# Patient Record
Sex: Male | Born: 1960 | Race: Black or African American | Hispanic: No | Smoking: Current some day smoker
Health system: Southern US, Community
[De-identification: ages and names within clinical notes are randomized; demographics above are authoritative.]

## PROBLEM LIST (undated history)

## (undated) DIAGNOSIS — R197 Diarrhea, unspecified: Secondary | ICD-10-CM

## (undated) DIAGNOSIS — K429 Umbilical hernia without obstruction or gangrene: Secondary | ICD-10-CM

## (undated) DIAGNOSIS — B019 Varicella without complication: Secondary | ICD-10-CM

## (undated) DIAGNOSIS — K439 Ventral hernia without obstruction or gangrene: Secondary | ICD-10-CM

## (undated) DIAGNOSIS — M1990 Unspecified osteoarthritis, unspecified site: Secondary | ICD-10-CM

## (undated) DIAGNOSIS — I5030 Unspecified diastolic (congestive) heart failure: Secondary | ICD-10-CM

## (undated) DIAGNOSIS — K219 Gastro-esophageal reflux disease without esophagitis: Secondary | ICD-10-CM

## (undated) DIAGNOSIS — I2510 Atherosclerotic heart disease of native coronary artery without angina pectoris: Secondary | ICD-10-CM

## (undated) DIAGNOSIS — M5030 Other cervical disc degeneration, unspecified cervical region: Secondary | ICD-10-CM

## (undated) DIAGNOSIS — I700 Atherosclerosis of aorta: Secondary | ICD-10-CM

## (undated) DIAGNOSIS — G40909 Epilepsy, unspecified, not intractable, without status epilepticus: Secondary | ICD-10-CM

## (undated) DIAGNOSIS — E785 Hyperlipidemia, unspecified: Secondary | ICD-10-CM

## (undated) DIAGNOSIS — K469 Unspecified abdominal hernia without obstruction or gangrene: Secondary | ICD-10-CM

## (undated) DIAGNOSIS — I6789 Other cerebrovascular disease: Secondary | ICD-10-CM

## (undated) DIAGNOSIS — I10 Essential (primary) hypertension: Secondary | ICD-10-CM

## (undated) DIAGNOSIS — E669 Obesity, unspecified: Secondary | ICD-10-CM

## (undated) DIAGNOSIS — M5134 Other intervertebral disc degeneration, thoracic region: Secondary | ICD-10-CM

## (undated) DIAGNOSIS — I422 Other hypertrophic cardiomyopathy: Secondary | ICD-10-CM

## (undated) DIAGNOSIS — K635 Polyp of colon: Secondary | ICD-10-CM

## (undated) DIAGNOSIS — I509 Heart failure, unspecified: Secondary | ICD-10-CM

## (undated) DIAGNOSIS — M2578 Osteophyte, vertebrae: Secondary | ICD-10-CM

## (undated) DIAGNOSIS — Z87891 Personal history of nicotine dependence: Secondary | ICD-10-CM

## (undated) DIAGNOSIS — J189 Pneumonia, unspecified organism: Secondary | ICD-10-CM

## (undated) DIAGNOSIS — J449 Chronic obstructive pulmonary disease, unspecified: Secondary | ICD-10-CM

## (undated) DIAGNOSIS — E876 Hypokalemia: Secondary | ICD-10-CM

## (undated) DIAGNOSIS — IMO0002 Reserved for concepts with insufficient information to code with codable children: Secondary | ICD-10-CM

## (undated) DIAGNOSIS — M795 Residual foreign body in soft tissue: Secondary | ICD-10-CM

## (undated) DIAGNOSIS — R55 Syncope and collapse: Secondary | ICD-10-CM

## (undated) DIAGNOSIS — Z1389 Encounter for screening for other disorder: Secondary | ICD-10-CM

## (undated) DIAGNOSIS — I517 Cardiomegaly: Secondary | ICD-10-CM

## (undated) HISTORY — DX: Essential (primary) hypertension: I10

## (undated) HISTORY — DX: Umbilical hernia without obstruction or gangrene: K42.9

## (undated) HISTORY — DX: Encounter for screening for other disorder: Z13.89

## (undated) HISTORY — DX: Polyp of colon: K63.5

## (undated) HISTORY — DX: Personal history of nicotine dependence: Z87.891

## (undated) HISTORY — DX: Obesity, unspecified: E66.9

## (undated) HISTORY — DX: Epilepsy, unspecified, not intractable, without status epilepticus: G40.909

## (undated) HISTORY — DX: Diarrhea, unspecified: R19.7

## (undated) HISTORY — DX: Reserved for concepts with insufficient information to code with codable children: IMO0002

## (undated) HISTORY — DX: Ventral hernia without obstruction or gangrene: K43.9

## (undated) HISTORY — DX: Unspecified abdominal hernia without obstruction or gangrene: K46.9

## (undated) HISTORY — DX: Unspecified osteoarthritis, unspecified site: M19.90

---
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
## 1981-10-28 HISTORY — PX: KNEE ARTHROSCOPY: SUR90

## 2004-08-19 ENCOUNTER — Emergency Department: Payer: Self-pay | Admitting: Emergency Medicine

## 2004-08-26 ENCOUNTER — Emergency Department: Payer: Self-pay | Admitting: Emergency Medicine

## 2005-01-28 DIAGNOSIS — K469 Unspecified abdominal hernia without obstruction or gangrene: Secondary | ICD-10-CM

## 2005-01-28 HISTORY — DX: Unspecified abdominal hernia without obstruction or gangrene: K46.9

## 2008-10-06 ENCOUNTER — Ambulatory Visit: Payer: Self-pay | Admitting: General Surgery

## 2009-01-28 DIAGNOSIS — M1990 Unspecified osteoarthritis, unspecified site: Secondary | ICD-10-CM

## 2009-01-28 HISTORY — DX: Unspecified osteoarthritis, unspecified site: M19.90

## 2009-08-25 ENCOUNTER — Ambulatory Visit: Payer: Self-pay | Admitting: Orthopedic Surgery

## 2009-09-14 ENCOUNTER — Ambulatory Visit: Payer: Self-pay | Admitting: Orthopedic Surgery

## 2009-10-28 HISTORY — PX: KNEE SURGERY: SHX244

## 2011-12-29 HISTORY — PX: HERNIA REPAIR: SHX51

## 2012-01-11 ENCOUNTER — Encounter: Payer: Self-pay | Admitting: *Deleted

## 2012-01-15 ENCOUNTER — Ambulatory Visit: Payer: Self-pay | Admitting: General Surgery

## 2012-01-15 DIAGNOSIS — K635 Polyp of colon: Secondary | ICD-10-CM

## 2012-01-15 HISTORY — DX: Polyp of colon: K63.5

## 2012-01-15 HISTORY — PX: COLONOSCOPY: SHX174

## 2012-01-27 ENCOUNTER — Ambulatory Visit: Payer: Self-pay | Admitting: Anesthesiology

## 2012-01-27 DIAGNOSIS — I10 Essential (primary) hypertension: Secondary | ICD-10-CM

## 2012-01-28 ENCOUNTER — Ambulatory Visit: Payer: Self-pay | Admitting: General Surgery

## 2012-01-28 LAB — CBC WITH DIFFERENTIAL/PLATELET
Basophil %: 0.2 %
Eosinophil %: 0 %
HCT: 44.4 % (ref 40.0–52.0)
Lymphocyte %: 22.6 %
MCV: 86 fL (ref 80–100)
Neutrophil %: 67.7 %

## 2012-01-28 LAB — BASIC METABOLIC PANEL
BUN: 15 mg/dL (ref 7–18)
Calcium, Total: 8.2 mg/dL — ABNORMAL LOW (ref 8.5–10.1)
Chloride: 106 mmol/L (ref 98–107)
Co2: 27 mmol/L (ref 21–32)
EGFR (African American): >60
EGFR (Non-African Amer.): >60
Osmolality: 281 (ref 275–301)
Potassium: 4.3 mmol/L (ref 3.5–5.1)

## 2012-04-07 ENCOUNTER — Encounter: Payer: Self-pay | Admitting: *Deleted

## 2012-04-20 ENCOUNTER — Ambulatory Visit: Payer: Self-pay | Admitting: General Surgery

## 2012-04-20 ENCOUNTER — Encounter: Payer: Self-pay | Admitting: *Deleted

## 2012-05-12 ENCOUNTER — Ambulatory Visit (INDEPENDENT_AMBULATORY_CARE_PROVIDER_SITE_OTHER): Payer: 59 | Admitting: General Surgery

## 2012-05-12 ENCOUNTER — Encounter: Payer: Self-pay | Admitting: General Surgery

## 2012-05-12 VITALS — BP 160/82 | HR 82 | Resp 14 | Ht 66.0 in | Wt 295.0 lb

## 2012-05-12 DIAGNOSIS — K439 Ventral hernia without obstruction or gangrene: Secondary | ICD-10-CM

## 2012-05-12 NOTE — Progress Notes (Signed)
Patient ID: Douglas Newton, male   DOB: [birth year], 52 y.o.   MRN: [id number]  Chief Complaint  Patient presents with  . Routine Post Op    hernia    HPI Douglas Newton is a 52 y.o. male here today for post op ventral  hernia . The patient underwent laparoscopic repair of a ventral and a recurrent umbilical hernia making use of a 10 x 15 cm Physiomesh on December 31, 2011. He has no complaints at present. HPI  Past Medical History  Diagnosis Date  . Arthritis 2011  . Hypertension admits 2011  . Ventral hernia, unspecified, without mention of obstruction or gangrene   . Personal history of tobacco use, presenting hazards to health   . Diarrhea   . Ulcer 1975    admits 1974  . Hernia 2007    admits 2006  . Seizure disorder 1965    admits 1964  . Obesity, unspecified   . Umbilical hernia without mention of obstruction or gangrene   . Screening for obesity     Past Surgical History  Procedure Laterality Date  . Abdominal surgery  1965  . Knee surgery  October 2011    left knee  . Knee arthroscopy  October 1983    right knee  . Lobectomy  May 1965  . Wires removed from under arm  1963  . Arm surgery  1969  . Stomach tube  1965  . Hernia repair  December 2013    ventral hernia,umbilical hernia    No family history on file.  Social History History  Substance Use Topics  . Smoking status: Former Smoker -- 1.00 packs/day for 1 years  . Smokeless tobacco: Never Used  . Alcohol Use: Yes    No Known Allergies  Current Outpatient Prescriptions  Medication Sig Dispense Refill  . carbamazepine (TEGRETOL) 200 MG tablet Take 200 mg by mouth 4 (four) times daily.      . nebivolol (BYSTOLIC) 10 MG tablet Take 10 mg by mouth daily.      . tadalafil (CIALIS) 20 MG tablet Take 20 mg by mouth as needed.       No current facility-administered medications for this visit.    Review of Systems Review of Systems  Constitutional: Negative.   Respiratory: Negative.    Cardiovascular: Negative.     Blood pressure 160/82, pulse 82, resp. rate 14, height 5\' 6"  (1.676 m), weight 295 lb (133.811 kg).  Physical Exam Physical Exam  Abdominal: Soft. Normal appearance. No hernia.  Healing well ventral hernia   The port sites are well-healed. Previously noted tenderness at the right lateral transabdominal wall suture site has resolved.  Data Reviewed None.  Assessment    Doing well status post laparoscopic ventral hernia repair.     Plan    The patient has been strongly encouraged to consider evaluation for sleep apnea.He reports he will "consider this".  Good judgment with strenuous activity and lifting has been encouraged.  Follow-through be done as needed basis.        Douglas Newton, Douglas Newton 05/12/2012, 9:57 PM

## 2012-05-12 NOTE — Patient Instructions (Addendum)
Prn

## 2014-05-20 NOTE — Op Note (Signed)
PATIENT NAME:  Douglas Newton, Douglas Newton MR#:  [id number] DATE OF BIRTH:  03/31/1960  DATE OF PROCEDURE:  01/28/2012  PREOPERATIVE DIAGNOSES:  1.  Ventral hernia.  2.  Umbilical hernia.   POSTOPERATIVE DIAGNOSES: 1.  Ventral hernia. 2.  Umbilical hernia.  PROCEDURE PERFORMED: Laparoscopic repair of ventral and umbilical hernia with Physiomesh.   SURGEON: Robert Bellow, MD.   ANESTHESIA: General endotracheal under Dr. Carolin Sicks.   ESTIMATED BLOOD LOSS: Minimal.   INDICATIONS FOR SURGERY: This 54 year old male underwent abdominal exploration as a child. He has developed a hernia to the right of the midline with a large amount of non-reducible fat. CT scan completed three years ago showed no additional defects. Clinical exam prior to surgery suggested a small fascial defect at the umbilicus. He was felt to be a candidate for laparoscopic repair.   PROCEDURE IN DETAIL: With the patient under adequate general endotracheal anesthesia, the abdomen was prepped with ChloraPrep and draped. A 5 mm step port was placed after a Veress needle was inserted and the abdomen insufflated with CO2 at 10 mmHg.  Hanging drop test confirmed safe entry into the abdomen. A 5 mm angled scope was used and an examination showed no evidence of injury. A 12 mm port was placed approximately 5 cm inferior to this and an additional 5 mm step port placed 5 mm below the 12 mm port site. These were all outside the pectoralis fascia. Inspection from the lowest port showed no evidence of injury from initial port placement. There was a large amount of omentum through, what appeared to be, a fairly small defect. This was reduced. Hemostasis was with the Harmonic scalpel. Inspection of the bowel showed no injury. The falciform ligament was taken down to provide clear service for mesh placement. A 15 x 15 cm piece of Physiomesh was trimmed to 10 x 15 cm.  This was used to close the primary defect to the right of the midline. This was  anchored in place with secure strap absorbable tacks. Four corner sutures of 0-PDS were utilized, as well as two superior and inferior sutures made passage completed with the needle passer. The area of the umbilicus was cleared of fat. A small defect was noted. A 5 x 5 piece of Physiomesh was placed over this area and tacked circumferentially with the secure strap absorbable tacks. Good hemostasis was noted. The abdomen was then desufflated under direct vision. The fascia at the 12 mm port site was closed with an 0-PDS suture, making use of cone needle passer. This was done under direct vision. Skin incisions were closed with 4-0 Vicryl subcuticular sutures. Benzoin and SteriStrips were applied, followed by Telfa and Tegaderm dressings. Telfa was applied to the multiple transfacial suture sites after SteriStrips were applied. ABD pads, as well as an abdominal binder was then placed. The patient had significant _bucking_______ during awakening from anesthesia. Direct pressure was held over the hernia repair sites during this fairly vigorous coughing.   The patient did have pneumatic compression stockings and TED stockings for deep vein thrombosis prophylaxis. He received Kefzol intravenously for antibiotic prophylaxis.   This Delano Metz A. Gleason the [place] Pink, M.D. [facility] patient:   ____________________________ Robert Bellow, MD jwb:eg D: 01/28/2012 19:39:49 ET T: 01/28/2012 20:17:21 ET JOB#: [id number]  cc:  1.  Robert Bellow, MD, <Dictator> 2.  Irven Easterly. Kary Kos, MD [place] Amedeo Kinsman MD ELECTRONICALLY SIGNED 01/29/2012 11:58

## 2015-04-19 ENCOUNTER — Observation Stay
Admission: EM | Admit: 2015-04-19 | Discharge: 2015-04-20 | Disposition: A | Discharge status: Home / Self Care | Payer: 59 | Attending: Internal Medicine | Admitting: Internal Medicine

## 2015-04-19 ENCOUNTER — Inpatient Hospital Stay
Admission: EM | Admit: 2015-04-19 | Payer: Self-pay | Source: Other Acute Inpatient Hospital | Admitting: Cardiovascular Disease

## 2015-04-19 ENCOUNTER — Emergency Department: Payer: 59

## 2015-04-19 ENCOUNTER — Encounter: Payer: Self-pay | Admitting: *Deleted

## 2015-04-19 DIAGNOSIS — R9431 Abnormal electrocardiogram [ECG] [EKG]: Secondary | ICD-10-CM | POA: Diagnosis not present

## 2015-04-19 DIAGNOSIS — E869 Volume depletion, unspecified: Secondary | ICD-10-CM | POA: Diagnosis not present

## 2015-04-19 DIAGNOSIS — R55 Syncope and collapse: Principal | ICD-10-CM | POA: Diagnosis present

## 2015-04-19 DIAGNOSIS — E876 Hypokalemia: Secondary | ICD-10-CM | POA: Insufficient documentation

## 2015-04-19 DIAGNOSIS — Z8249 Family history of ischemic heart disease and other diseases of the circulatory system: Secondary | ICD-10-CM | POA: Diagnosis not present

## 2015-04-19 DIAGNOSIS — D72829 Elevated white blood cell count, unspecified: Secondary | ICD-10-CM | POA: Insufficient documentation

## 2015-04-19 DIAGNOSIS — K529 Noninfective gastroenteritis and colitis, unspecified: Secondary | ICD-10-CM | POA: Diagnosis present

## 2015-04-19 DIAGNOSIS — R918 Other nonspecific abnormal finding of lung field: Secondary | ICD-10-CM | POA: Insufficient documentation

## 2015-04-19 DIAGNOSIS — I517 Cardiomegaly: Secondary | ICD-10-CM | POA: Diagnosis present

## 2015-04-19 DIAGNOSIS — Z9889 Other specified postprocedural states: Secondary | ICD-10-CM | POA: Insufficient documentation

## 2015-04-19 DIAGNOSIS — Z87891 Personal history of nicotine dependence: Secondary | ICD-10-CM | POA: Insufficient documentation

## 2015-04-19 DIAGNOSIS — E669 Obesity, unspecified: Secondary | ICD-10-CM | POA: Insufficient documentation

## 2015-04-19 DIAGNOSIS — G40909 Epilepsy, unspecified, not intractable, without status epilepticus: Secondary | ICD-10-CM | POA: Diagnosis not present

## 2015-04-19 DIAGNOSIS — H538 Other visual disturbances: Secondary | ICD-10-CM | POA: Diagnosis not present

## 2015-04-19 DIAGNOSIS — R109 Unspecified abdominal pain: Secondary | ICD-10-CM | POA: Insufficient documentation

## 2015-04-19 DIAGNOSIS — I10 Essential (primary) hypertension: Secondary | ICD-10-CM | POA: Diagnosis not present

## 2015-04-19 DIAGNOSIS — Z79899 Other long term (current) drug therapy: Secondary | ICD-10-CM | POA: Diagnosis not present

## 2015-04-19 DIAGNOSIS — R51 Headache: Secondary | ICD-10-CM | POA: Insufficient documentation

## 2015-04-19 DIAGNOSIS — Z8711 Personal history of peptic ulcer disease: Secondary | ICD-10-CM | POA: Insufficient documentation

## 2015-04-19 DIAGNOSIS — Z6841 Body Mass Index (BMI) 40.0 and over, adult: Secondary | ICD-10-CM | POA: Insufficient documentation

## 2015-04-19 DIAGNOSIS — R197 Diarrhea, unspecified: Secondary | ICD-10-CM | POA: Diagnosis not present

## 2015-04-19 DIAGNOSIS — R112 Nausea with vomiting, unspecified: Secondary | ICD-10-CM | POA: Diagnosis not present

## 2015-04-19 DIAGNOSIS — M1990 Unspecified osteoarthritis, unspecified site: Secondary | ICD-10-CM | POA: Diagnosis not present

## 2015-04-19 DIAGNOSIS — Z7951 Long term (current) use of inhaled steroids: Secondary | ICD-10-CM | POA: Insufficient documentation

## 2015-04-19 DIAGNOSIS — K219 Gastro-esophageal reflux disease without esophagitis: Secondary | ICD-10-CM | POA: Diagnosis not present

## 2015-04-19 HISTORY — DX: Gastro-esophageal reflux disease without esophagitis: K21.9

## 2015-04-19 LAB — COMPREHENSIVE METABOLIC PANEL
ALK PHOS: 75 U/L (ref 38–126)
ALT: 21 U/L (ref 17–63)
AST: 32 U/L (ref 15–41)
Albumin: 3.8 g/dL (ref 3.5–5.0)
Anion gap: 6 (ref 5–15)
BUN: 18 mg/dL (ref 6–20)
CALCIUM: 8.8 mg/dL — AB (ref 8.9–10.3)
CHLORIDE: 109 mmol/L (ref 101–111)
CO2: 23 mmol/L (ref 22–32)
CREATININE: 0.99 mg/dL (ref 0.61–1.24)
Glucose, Bld: 140 mg/dL — ABNORMAL HIGH (ref 65–99)
Potassium: 4.4 mmol/L (ref 3.5–5.1)
SODIUM: 138 mmol/L (ref 135–145)
Total Bilirubin: 0.6 mg/dL (ref 0.3–1.2)
Total Protein: 6.9 g/dL (ref 6.5–8.1)

## 2015-04-19 LAB — CBC WITH DIFFERENTIAL/PLATELET
Basophils Absolute: 0 10*3/uL (ref 0–0.1)
Basophils Relative: 0 %
EOS ABS: 0 10*3/uL (ref 0–0.7)
EOS PCT: 0 %
HCT: 44.3 % (ref 40.0–52.0)
Hemoglobin: 14.7 g/dL (ref 13.0–18.0)
LYMPHS ABS: 1.5 10*3/uL (ref 1.0–3.6)
LYMPHS PCT: 9 %
MCH: 29 pg (ref 26.0–34.0)
MCHC: 33.1 g/dL (ref 32.0–36.0)
MCV: 87.5 fL (ref 80.0–100.0)
MONO ABS: 1.1 10*3/uL — AB (ref 0.2–1.0)
Monocytes Relative: 6 %
Neutro Abs: 14.3 10*3/uL — ABNORMAL HIGH (ref 1.4–6.5)
Neutrophils Relative %: 85 %
PLATELETS: 252 10*3/uL (ref 150–440)
RBC: 5.06 MIL/uL (ref 4.40–5.90)
RDW: 13.3 % (ref 11.5–14.5)
WBC: 17 10*3/uL — ABNORMAL HIGH (ref 3.8–10.6)

## 2015-04-19 LAB — PROTIME-INR
INR: 1.18
PROTHROMBIN TIME: 15.2 s — AB (ref 11.4–15.0)

## 2015-04-19 LAB — APTT: aPTT: 29 seconds (ref 24–36)

## 2015-04-19 LAB — TROPONIN I
Troponin I: <0.03 ng/mL (ref <0.031)
Troponin I: <0.03 ng/mL (ref <0.031)

## 2015-04-19 LAB — MAGNESIUM: MAGNESIUM: 1.8 mg/dL (ref 1.7–2.4)

## 2015-04-19 LAB — PHOSPHORUS: Phosphorus: 2.5 mg/dL (ref 2.5–4.6)

## 2015-04-19 MED ORDER — PROMETHAZINE HCL 25 MG/ML IJ SOLN
12.5000 mg | Freq: Once | INTRAMUSCULAR | Status: AC
  Administered 2015-04-19: 12.5 mg via INTRAVENOUS
  Filled 2015-04-19: qty 1

## 2015-04-19 MED ORDER — SODIUM CHLORIDE 0.9 % IV SOLN
INTRAVENOUS | Status: AC | PRN
  Administered 2015-04-19: 1000 mL via INTRAVENOUS

## 2015-04-19 MED ORDER — ONDANSETRON HCL 4 MG/2ML IJ SOLN
4.0000 mg | Freq: Once | INTRAMUSCULAR | Status: AC
  Administered 2015-04-19: 4 mg via INTRAVENOUS
  Filled 2015-04-19: qty 2

## 2015-04-19 MED ORDER — SODIUM CHLORIDE 0.9 % IV SOLN
8.0000 mg | Freq: Once | INTRAVENOUS | Status: AC
  Administered 2015-04-19: 8 mg via INTRAVENOUS

## 2015-04-19 MED ORDER — ASPIRIN 81 MG PO CHEW
324.0000 mg | CHEWABLE_TABLET | Freq: Once | ORAL | Status: AC
  Administered 2015-04-19: 324 mg via ORAL

## 2015-04-19 MED ORDER — LOPERAMIDE HCL 2 MG PO TABS: 4.0000 mg | ORAL_TABLET | Freq: Four times a day (QID) | ORAL | Status: DC | PRN

## 2015-04-19 MED ORDER — SODIUM CHLORIDE 0.9 % IV BOLUS (SEPSIS)
1000.0000 mL | Freq: Once | INTRAVENOUS | Status: AC
  Administered 2015-04-19: 1000 mL via INTRAVENOUS

## 2015-04-19 MED ORDER — RANITIDINE HCL 150 MG PO CAPS: 150.0000 mg | ORAL_CAPSULE | Freq: Two times a day (BID) | ORAL | Status: DC

## 2015-04-19 MED ORDER — ONDANSETRON 8 MG PO TBDP: 8.0000 mg | ORAL_TABLET | Freq: Three times a day (TID) | ORAL | Status: DC | PRN

## 2015-04-19 MED ORDER — HEPARIN SODIUM (PORCINE) 5000 UNIT/ML IJ SOLN: 4000.0000 [IU] | Freq: Once | INTRAMUSCULAR | Status: DC

## 2015-04-19 NOTE — H&P (Signed)
[facility] [place] NAME: Douglas Newton    MR#:  [id number]  DATE OF BIRTH:  1960/12/31  DATE OF ADMISSION:  04/19/2015  PRIMARY CARE PHYSICIAN: [place] Pink, MD   REQUESTING/REFERRING PHYSICIAN: Edd Fabian, MD  CHIEF COMPLAINT:   Chief Complaint  Patient presents with  . Loss of Consciousness  . Emesis  . Diarrhea    HISTORY OF PRESENT ILLNESS:  Douglas Newton  is a 55 y.o. male who presents with Nausea vomiting, but then subsequently had a syncopal episode in the ED. She states that he started having abdominal pain with a headache and nausea with vomiting today. He went to the urgent care clinic to be evaluated. In the urgent care clinic he became lightheaded and had a near syncopal event. They put him in a wheelchair and sent him over the ED for evaluation. Here he is being assisted up to use the bathroom and had a full syncopal event. Hospitals were called for admission for evaluation of his syncope. Of note, patient has a significant family history of cardiac pathology, though denies any overt prior cardiac history himself.  PAST MEDICAL HISTORY:   Past Medical History  Diagnosis Date  . Arthritis 2011  . Hypertension admits 2011  . Ventral hernia, unspecified, without mention of obstruction or gangrene   . Personal history of tobacco use, presenting hazards to health   . Diarrhea   . Ulcer 1975    admits 1974  . Hernia 2007    admits 2006  . Seizure disorder ([place]) 1965    admits 1964  . Obesity, unspecified   . Umbilical hernia without mention of obstruction or gangrene   . Screening for obesity   . GERD (gastroesophageal reflux disease)     PAST SURGICAL HISTORY:   Past Surgical History  Procedure Laterality Date  . Abdominal surgery  1965  . Knee surgery  October 2011    left knee  . Knee arthroscopy  October 1983    right knee  . Lobectomy  May 1965  . Wires removed from under arm  1963  . Arm  surgery  1969  . Stomach tube  1965  . Hernia repair  December 2013    ventral hernia,umbilical hernia    SOCIAL HISTORY:   Social History  Substance Use Topics  . Smoking status: Former Smoker -- 1.00 packs/day for 1 years  . Smokeless tobacco: Never Used  . Alcohol Use: Yes    FAMILY HISTORY:   Family History  Problem Relation Age of Onset  . Heart attack Maternal Grandfather   . Heart attack Maternal Grandmother   . Heart attack Mother   . Hypertension Paternal Grandfather   . Hypertension Paternal Grandmother     DRUG ALLERGIES:  No Known Allergies  MEDICATIONS AT HOME:   Prior to Admission medications   Medication Sig Start Date End Date Taking? Authorizing Provider  carbamazepine (TEGRETOL) 200 MG tablet Take 200 mg by mouth 4 (four) times daily.   Yes Historical Provider, MD  fluticasone (FLONASE) 50 MCG/ACT nasal spray Place 2 sprays into both nostrils daily.   Yes Historical Provider, MD  hydrALAZINE (APRESOLINE) 50 MG tablet Take 50 mg by mouth 3 (three) times daily.   Yes Historical Provider, MD  loratadine (CLARITIN) 10 MG tablet Take 10 mg by mouth daily as needed for allergies.   Yes Historical Provider, MD  losartan (COZAAR) 100 MG tablet Take 100 mg by mouth daily.  Yes Historical Provider, MD  nebivolol (BYSTOLIC) 10 MG tablet Take 10 mg by mouth daily at 12 noon.    Yes Historical Provider, MD  sildenafil (VIAGRA) 100 MG tablet Take 100 mg by mouth daily as needed for erectile dysfunction.   Yes Historical Provider, MD  loperamide (IMODIUM A-D) 2 MG tablet Take 2 tablets (4 mg total) by mouth 4 (four) times daily as needed for diarrhea or loose stools. 04/19/15   Carrie Mew, MD  ondansetron (ZOFRAN ODT) 8 MG disintegrating tablet Take 1 tablet (8 mg total) by mouth every 8 (eight) hours as needed for nausea or vomiting. 04/19/15   Carrie Mew, MD  ranitidine (ZANTAC) 150 MG capsule Take 1 capsule (150 mg total) by mouth 2 (two) times daily.  04/19/15   Carrie Mew, MD    REVIEW OF SYSTEMS:  Review of Systems  Constitutional: Negative for fever, chills, weight loss and malaise/fatigue.  HENT: Negative for ear pain, hearing loss and tinnitus.   Eyes: Negative for blurred vision, double vision, pain and redness.  Respiratory: Negative for cough, hemoptysis and shortness of breath.   Cardiovascular: Negative for chest pain, palpitations, orthopnea and leg swelling.  Gastrointestinal: Positive for nausea, vomiting and abdominal pain. Negative for diarrhea and constipation.  Genitourinary: Negative for dysuria, frequency and hematuria.  Musculoskeletal: Negative for back pain, joint pain and neck pain.  Skin:       No acne, rash, or lesions  Neurological: Positive for loss of consciousness and headaches. Negative for dizziness, tremors, focal weakness and weakness.  Endo/Heme/Allergies: Negative for polydipsia. Does not bruise/bleed easily.  Psychiatric/Behavioral: Negative for depression. The patient is not nervous/anxious and does not have insomnia.      VITAL SIGNS:   Filed Vitals:   04/19/15 2100 04/19/15 2115 04/19/15 2130 04/19/15 2145  BP: 203/98 196/100 188/98 186/95  Pulse:  79 77 74  Temp:      TempSrc:      Resp: 12 19 19 16   Height:      Weight:      SpO2:  98% 96% 95%   Wt Readings from Last 3 Encounters:  04/19/15 136.079 kg (300 lb)  05/12/12 133.811 kg (295 lb)  02/20/12 132.904 kg (293 lb)    PHYSICAL EXAMINATION:  Physical Exam  Vitals reviewed. Constitutional: He is oriented to person, place, and time. He appears well-developed and well-nourished. No distress.  HENT:  Head: Normocephalic and atraumatic.  Mouth/Throat: Oropharynx is clear and moist.  Eyes: Conjunctivae and EOM are normal. Pupils are equal, round, and reactive to light. No scleral icterus.  Neck: Normal range of motion. Neck supple. No JVD present. No thyromegaly present.  Cardiovascular: Normal rate, regular rhythm and  intact distal pulses.  Exam reveals no gallop and no friction rub.   No murmur heard. Respiratory: Effort normal and breath sounds normal. No respiratory distress. He has no wheezes. He has no rales.  GI: Soft. Bowel sounds are normal. He exhibits no distension. There is no tenderness.  Musculoskeletal: Normal range of motion. He exhibits no edema.  No arthritis, no gout  Lymphadenopathy:    He has no cervical adenopathy.  Neurological: He is alert and oriented to person, place, and time. No cranial nerve deficit.  No dysarthria, no aphasia  Skin: Skin is warm and dry. No rash noted. No erythema.  Psychiatric: He has a normal mood and affect. His behavior is normal. Judgment and thought content normal.    LABORATORY PANEL:   CBC  Recent Labs Lab 04/19/15 1716  WBC 17.0*  HGB 14.7  HCT 44.3  PLT 252   ------------------------------------------------------------------------------------------------------------------  Chemistries   Recent Labs Lab 04/19/15 1716  NA 138  K 4.4  CL 109  CO2 23  GLUCOSE 140*  BUN 18  CREATININE 0.99  CALCIUM 8.8*  MG 1.8  AST 32  ALT 21  ALKPHOS 75  BILITOT 0.6   ------------------------------------------------------------------------------------------------------------------  Cardiac Enzymes  Recent Labs Lab 04/19/15 2024  TROPONINI <0.03   ------------------------------------------------------------------------------------------------------------------  RADIOLOGY:  Dg Abd Acute W/chest  04/19/2015  CLINICAL DATA:  Acute onset abdominal pain common nausea with vomiting, and diarrhea this morning. Blurred vision. EXAM: DG ABDOMEN ACUTE W/ 1V CHEST COMPARISON:  None. FINDINGS: There is no evidence of dilated bowel loops or free intraperitoneal air. No radiopaque calculi or other significant radiographic abnormality is seen. Large patient habitus limits evaluation. Mild cardiomegaly noted. Low lung volumes are noted however both  lungs are clear. Postop changes noted from previous right thoracotomy. No evidence of pneumothorax or pleural effusion. IMPRESSION: Unremarkable bowel gas pattern.  No acute findings. Mild cardiomegaly.  No active lung disease. Electronically Signed   By: Earle Gell M.D.   On: 04/19/2015 18:42    EKG:   Orders placed or performed during the hospital encounter of 04/19/15  . EKG 12-Lead  . EKG 12-Lead    IMPRESSION AND PLAN:  Principal Problem:   Syncope and collapse - patient and full syncopal episode in the ED. Unclear etiology at this time. 2 sets of troponins so far are negative, that his initial EKG did have some possibly concerning ST depressions. However, cardiology initially consulted by the ED doctor did not feel that he was having acute significant MI. We'll trend his troponins serially tonight for minimum of 3 sets, get an echocardiogram and cardiology consult tomorrow. Active Problems:   HTN (hypertension) - continue home meds, can use additional when necessary antihypertensives for BP goal less than 160/100.   Seizure disorder ([place]) - continue home antiepileptic medication.   Leukocytosis - unclear etiology, the differential shows mild left shift. Patient denies any infectious symptoms, other than perhaps his nausea and vomiting. We'll repeat CBC to confirm true leukocytosis to start.   GERD (gastroesophageal reflux disease) - home dose H2 blocker  All the records are reviewed and case discussed with ED provider. Management plans discussed with the patient and/or family.  DVT PROPHYLAXIS: SubQ lovenox  GI PROPHYLAXIS: H2 blocker  ADMISSION STATUS: Observation  CODE STATUS: Full Code Status History    This patient does not have a recorded code status. Please follow your organizational policy for patients in this situation.      TOTAL TIME TAKING CARE OF THIS PATIENT: 40 minutes.    Bomani Oommen FIELDING 04/19/2015, 10:42 PM  [facility]  Office   [phone]  CC: Primary care physician; [place] Pink, MD

## 2015-04-19 NOTE — ED Provider Notes (Signed)
[facility] Emergency Department Provider Note  ____________________________________________  Time seen: 5:05 PM  I have reviewed the triage vital signs and the nursing notes.   HISTORY  Chief Complaint Loss of Consciousness; Emesis; and Diarrhea    HPI Douglas Newton is a 55 y.o. male brought to the ED due to generalized abdominal pain with vomiting. He is in his usual state of health until this morning when he ate a sausage Egg McMuffin for breakfast. Afterward he started feeling ill and felt nauseated all morning. Upon returning home from work this afternoon he started vomiting and was brought to the ED by his wife. On arrival he is vomiting frequently and having watery bowel movements.  He denies any chest pain now or recently. No exertional symptoms recently. He did have a brief episode of loss of consciousness in triage.       Past Medical History  Diagnosis Date  . Arthritis 2011  . Hypertension admits 2011  . Ventral hernia, unspecified, without mention of obstruction or gangrene   . Personal history of tobacco use, presenting hazards to health   . Diarrhea   . Ulcer 1975    admits 1974  . Hernia 2007    admits 2006  . Seizure disorder ([place]) 1965    admits 1964  . Obesity, unspecified   . Umbilical hernia without mention of obstruction or gangrene   . Screening for obesity      There are no active problems to display for this patient.    Past Surgical History  Procedure Laterality Date  . Abdominal surgery  1965  . Knee surgery  October 2011    left knee  . Knee arthroscopy  October 1983    right knee  . Lobectomy  May 1965  . Wires removed from under arm  1963  . Arm surgery  1969  . Stomach tube  1965  . Hernia repair  December 2013    ventral hernia,umbilical hernia     Current Outpatient Rx  Name  Route  Sig  Dispense  Refill  . carbamazepine (TEGRETOL) 200 MG tablet   Oral   Take 200 mg by mouth 4 (four) times  daily.         . fluticasone (FLONASE) 50 MCG/ACT nasal spray   Each Nare   Place 2 sprays into both nostrils daily.         . hydrALAZINE (APRESOLINE) 50 MG tablet   Oral   Take 50 mg by mouth 3 (three) times daily.         Marland Kitchen loratadine (CLARITIN) 10 MG tablet   Oral   Take 10 mg by mouth daily as needed for allergies.         Marland Kitchen losartan (COZAAR) 100 MG tablet   Oral   Take 100 mg by mouth daily.         . nebivolol (BYSTOLIC) 10 MG tablet   Oral   Take 10 mg by mouth daily at 12 noon.          . sildenafil (VIAGRA) 100 MG tablet   Oral   Take 100 mg by mouth daily as needed for erectile dysfunction.         Marland Kitchen loperamide (IMODIUM A-D) 2 MG tablet   Oral   Take 2 tablets (4 mg total) by mouth 4 (four) times daily as needed for diarrhea or loose stools.   30 tablet   0   . ondansetron (ZOFRAN  ODT) 8 MG disintegrating tablet   Oral   Take 1 tablet (8 mg total) by mouth every 8 (eight) hours as needed for nausea or vomiting.   20 tablet   0   . ranitidine (ZANTAC) 150 MG capsule   Oral   Take 1 capsule (150 mg total) by mouth 2 (two) times daily.   28 capsule   0      Allergies Review of patient's allergies indicates no known allergies.   No family history on file.  Social History Social History  Substance Use Topics  . Smoking status: Former Smoker -- 1.00 packs/day for 1 years  . Smokeless tobacco: Never Used  . Alcohol Use: Yes    Review of Systems  Constitutional:   No fever or chills. No weight changes Eyes:   No blurry vision or double vision.  ENT:   No sore throat.  Cardiovascular:   No chest pain. Respiratory:   No dyspnea or cough. Gastrointestinal:   Generalized abdominal pain with vomiting and diarrhea.  No BRBPR or melena. Genitourinary:   Negative for dysuria or difficulty urinating. Musculoskeletal:   Negative for back pain. No joint swelling or pain. Skin:   Negative for rash. Neurological:   Negative for headaches,  focal weakness or numbness. Psychiatric:  No anxiety or depression.   Endocrine:  No changes in energy or sleep difficulty.  10-point ROS otherwise negative.  ____________________________________________   PHYSICAL EXAM:  VITAL SIGNS: ED Triage Vitals  Enc Vitals Group     BP 04/19/15 1710 154/65 mmHg     Pulse Rate 04/19/15 1710 78     Resp 04/19/15 1710 20     Temp 04/19/15 1710 99.3 F (37.4 C)     Temp Source 04/19/15 1710 Oral     SpO2 04/19/15 1710 90 %     Weight 04/19/15 1710 300 lb (136.079 kg)     Height 04/19/15 1710 5\' 6"  (1.676 m)     Head Cir --      Peak Flow --      Pain Score --      Pain Loc --      Pain Edu? --      Excl. in [place]? --     Vital signs reviewed, nursing assessments reviewed.   Constitutional:   Alert and oriented. Ill-appearing. Overweight with truncal obesity Eyes:   No scleral icterus. No conjunctival pallor. PERRL. EOMI ENT   Head:   Normocephalic and atraumatic.   Nose:   No congestion/rhinnorhea. No septal hematoma   Mouth/Throat:   Dry mucous membranes, no pharyngeal erythema. No peritonsillar mass.    Neck:   No stridor. No SubQ emphysema. No meningismus. Hematological/Lymphatic/Immunilogical:   No cervical lymphadenopathy. Cardiovascular:   RRR. Symmetric bilateral radial and DP pulses.  No murmurs.  Respiratory:   Normal respiratory effort without tachypnea nor retractions. Breath sounds are clear and equal bilaterally. No wheezes/rales/rhonchi. Gastrointestinal:   Soft with generalized tenderness. Mildly distended. There is no CVA tenderness.  No rebound, rigidity, or guarding. Genitourinary:   deferred Musculoskeletal:   Nontender with normal range of motion in all extremities. No joint effusions.  No lower extremity tenderness.  No edema. Neurologic:   Normal speech and language.  CN 2-10 normal. Motor grossly intact. No gross focal neurologic deficits are appreciated.  Skin:    Skin is warm, dry and intact.  No rash noted.  No petechiae, purpura, or bullae. Psychiatric:   Mood and affect are normal. ____________________________________________  LABS (pertinent positives/negatives) (all labs ordered are listed, but only abnormal results are displayed) Labs Reviewed  COMPREHENSIVE METABOLIC PANEL - Abnormal; Notable for the following:    Glucose, Bld 140 (*)    Calcium 8.8 (*)    All other components within normal limits  CBC WITH DIFFERENTIAL/PLATELET - Abnormal; Notable for the following:    WBC 17.0 (*)    Neutro Abs 14.3 (*)    Monocytes Absolute 1.1 (*)    All other components within normal limits  PROTIME-INR - Abnormal; Notable for the following:    Prothrombin Time 15.2 (*)    All other components within normal limits  TROPONIN I  APTT  MAGNESIUM  PHOSPHORUS  TROPONIN I   ____________________________________________   EKG  Interpreted by me Sinus rhythm rate of 78, right axis, normal intervals. Normal QRS. 1 mm ST elevation in 1 and aVL. 1 mm ST depression in V4 through V6. There are also T-wave inversions in V4 through V6 concerning for STEMI.   ____________________________________________    RADIOLOGY  X-rays abdominal series negative  ____________________________________________   PROCEDURES CRITICAL CARE Performed by: Joni Fears, Effa Yarrow   Total critical care time: 35 minutes  Critical care time was exclusive of separately billable procedures and treating other patients.  Critical care was necessary to treat or prevent imminent or life-threatening deterioration.  Critical care was time spent personally by me on the following activities: development of treatment plan with patient and/or surrogate as well as nursing, discussions with consultants, evaluation of patient's response to treatment, examination of patient, obtaining history from patient or surrogate, ordering and performing treatments and interventions, ordering and review of laboratory studies,  ordering and review of radiographic studies, pulse oximetry and re-evaluation of patient's condition.   ____________________________________________   INITIAL IMPRESSION / ASSESSMENT AND PLAN / ED COURSE  Pertinent labs & imaging results that were available during my care of the patient were reviewed by me and considered in my medical decision making (see chart for details).  Patient presents transabdominal pain vomiting and diarrhea. Has multiple risk factors for CAD. Initial EKG concerning for STEMI. Code STEMI activated at 5:08 PM. Received a call back from [place] catheter lab at 5:14 PM that Dr. Claiborne Billings was currently unavailable due to a heart catheterization in progress with another patient suffering a STEMI. Received another call back at 5:23 PM from after Gerrit Halls with [facility] who notes that with the patient's LVH and significant EKG abnormalities stating back many years and current history of present illness, he does not think that this is a STEMI and would not catheterize this patient emergently At the Present Time. Patient given chewable aspirin. Vomiting has abated after IV Zofran. IV fluids for now and will check labs.  Presentation is suspicious for food borne illness and gastroenteritis   ----------------------------------------- 8:31 PM on 04/19/2015 -----------------------------------------  Workup negative. No further vomiting in the ED. Vital signs stable. We'll repeat the troponin. If negative the patient will be discharged home with antiemetics and antacids.. Starting in trial of oral intake as well as some ice water right now. Patient counseled on oral hydration in the setting of gastroenteritis and the need to take a few sips very frequently and continue nausea medicines. Patient signed out to Dr. Edd Fabian pending repeat troponin.    ____________________________________________   FINAL CLINICAL IMPRESSION(S) / ED DIAGNOSES  Final diagnoses:  Acute  gastroenteritis  Left ventricular hypertrophy  Non-intractable vomiting with nausea, vomiting of unspecified type  Carrie Mew, MD 04/19/15 2056

## 2015-04-19 NOTE — ED Notes (Signed)
Patient presents to the ED with nausea, vomiting, and diarrhea suddenly this morning after eating an egg mcmuffin this morning.  Patient presented to triage reporting that he was feeling like he was going to pass out.  Reported that his vision was "dark around the edges."  As patient was wheeled back to an ED room, patient began to gag like he was going to vomit and then he lost consciousness, eyes rolled back in his head.  Patient is conscious at this time.  Patient is complaining of abdominal pain and headache.

## 2015-04-19 NOTE — ED Provider Notes (Signed)
-----------------------------------------   10:10 PM on 04/19/2015 -----------------------------------------  Icing care from Dr. Joni Fears at approximately 9 PM. Briefly this is a 55 year old male presented with abdominal pain and vomiting that episode of syncope earlier today in the emergency department. EKG was initially concerning for STEMI however cardiology confirmed that EKG changes were chronic/long-standing. Second troponin is negative however patient sat forward and had another fainting spell. He is unable to tolerate by mouth intake and is vomiting after receiving several rounds of antiemetics. Given his recurrent syncope, intractable nausea and vomiting, I discussed the case with the hospitalist, Dr. Jannifer Franklin, for admission.  Joanne Gavel, MD 04/19/15 2211

## 2015-04-19 NOTE — Discharge Instructions (Signed)

## 2015-04-19 NOTE — ED Notes (Signed)
Patient's wallet, cellphone and contents of pants pocket which included keys was put in a biohazard bag and given to the patient's wife.

## 2015-04-19 NOTE — ED Notes (Signed)
Pt had fainting spell when he sat up to go use the restroom.  Pt awakened easily by sternal rub.  Pt responded almost immediately after "fainting".  Pt put back in bed and back on monitor.  Dr Edd Fabian notified of this.

## 2015-04-20 ENCOUNTER — Observation Stay
Admit: 2015-04-20 | Discharge: 2015-04-20 | Disposition: A | Discharge status: Home / Self Care | Payer: 59 | Attending: Internal Medicine | Admitting: Internal Medicine

## 2015-04-20 LAB — CBC
HEMATOCRIT: 44.6 % (ref 40.0–52.0)
HEMATOCRIT: 42.1 % (ref 40.0–52.0)
HEMOGLOBIN: 14.1 g/dL (ref 13.0–18.0)
Hemoglobin: 14.8 g/dL (ref 13.0–18.0)
MCH: 29.5 pg (ref 26.0–34.0)
MCH: 29.3 pg (ref 26.0–34.0)
MCHC: 33.1 g/dL (ref 32.0–36.0)
MCHC: 33.5 g/dL (ref 32.0–36.0)
MCV: 89.4 fL (ref 80.0–100.0)
MCV: 87.3 fL (ref 80.0–100.0)
Platelets: 181 10*3/uL (ref 150–400)
Platelets: 187 10*3/uL (ref 150–440)
RBC: 5 MIL/uL (ref 4.40–5.90)
RBC: 4.82 MIL/uL (ref 4.40–5.90)
RDW: 13.7 % (ref 11.5–14.5)
RDW: 13.4 % (ref 11.5–14.5)
WBC: 12 10*3/uL — AB (ref 3.8–10.6)
WBC: 8.1 10*3/uL (ref 3.8–10.6)

## 2015-04-20 LAB — TROPONIN I
Troponin I: <0.03 ng/mL (ref <0.031)
Troponin I: <0.03 ng/mL (ref <0.031)

## 2015-04-20 LAB — BASIC METABOLIC PANEL
ANION GAP: 5 (ref 5–15)
BUN: 16 mg/dL (ref 6–20)
CALCIUM: 7.9 mg/dL — AB (ref 8.9–10.3)
CO2: 25 mmol/L (ref 22–32)
Chloride: 108 mmol/L (ref 101–111)
Creatinine, Ser: 0.82 mg/dL (ref 0.61–1.24)
Glucose, Bld: 106 mg/dL — ABNORMAL HIGH (ref 65–99)
POTASSIUM: 3.4 mmol/L — AB (ref 3.5–5.1)
Sodium: 138 mmol/L (ref 135–145)

## 2015-04-20 LAB — ECHOCARDIOGRAM COMPLETE
Height: 66 in
Weight: 4841.6 oz

## 2015-04-20 MED ORDER — ENOXAPARIN SODIUM 40 MG/0.4ML ~~LOC~~ SOLN
40.0000 mg | Freq: Two times a day (BID) | SUBCUTANEOUS | Status: DC
  Administered 2015-04-20: 40 mg via SUBCUTANEOUS
  Filled 2015-04-20 (×2): qty 0.4

## 2015-04-20 MED ORDER — LABETALOL HCL 5 MG/ML IV SOLN
10.0000 mg | INTRAVENOUS | Status: DC | PRN
  Administered 2015-04-20: 10 mg via INTRAVENOUS
  Filled 2015-04-20 (×2): qty 4

## 2015-04-20 MED ORDER — NEBIVOLOL HCL 5 MG PO TABS
10.0000 mg | ORAL_TABLET | Freq: Every day | ORAL | Status: DC
  Administered 2015-04-20: 10 mg via ORAL
  Filled 2015-04-20: qty 2

## 2015-04-20 MED ORDER — HYDRALAZINE HCL 50 MG PO TABS
50.0000 mg | ORAL_TABLET | Freq: Three times a day (TID) | ORAL | Status: DC
  Administered 2015-04-20: 50 mg via ORAL
  Filled 2015-04-20: qty 1

## 2015-04-20 MED ORDER — LOSARTAN POTASSIUM 50 MG PO TABS
100.0000 mg | ORAL_TABLET | Freq: Every day | ORAL | Status: DC
  Administered 2015-04-20: 100 mg via ORAL
  Filled 2015-04-20: qty 2

## 2015-04-20 MED ORDER — CARBAMAZEPINE 200 MG PO TABS
200.0000 mg | ORAL_TABLET | Freq: Once | ORAL | Status: AC
  Administered 2015-04-20: 200 mg via ORAL

## 2015-04-20 MED ORDER — ACETAMINOPHEN 325 MG PO TABS: 650.0000 mg | ORAL_TABLET | Freq: Four times a day (QID) | ORAL | Status: DC | PRN

## 2015-04-20 MED ORDER — ACETAMINOPHEN 650 MG RE SUPP: 650.0000 mg | Freq: Four times a day (QID) | RECTAL | Status: DC | PRN

## 2015-04-20 MED ORDER — ONDANSETRON HCL 4 MG/2ML IJ SOLN: 4.0000 mg | Freq: Four times a day (QID) | INTRAMUSCULAR | Status: DC | PRN

## 2015-04-20 MED ORDER — CARBAMAZEPINE 200 MG PO TABS
200.0000 mg | ORAL_TABLET | Freq: Four times a day (QID) | ORAL | Status: DC
  Administered 2015-04-20 (×2): 200 mg via ORAL
  Filled 2015-04-20 (×3): qty 1

## 2015-04-20 MED ORDER — SODIUM CHLORIDE 0.9 % IV SOLN
INTRAVENOUS | Status: AC
  Administered 2015-04-20: 02:00:00 via INTRAVENOUS

## 2015-04-20 MED ORDER — ONDANSETRON 8 MG PO TBDP: 8.0000 mg | ORAL_TABLET | Freq: Three times a day (TID) | ORAL | Status: DC | PRN

## 2015-04-20 MED ORDER — ONDANSETRON HCL 4 MG PO TABS: 4.0000 mg | ORAL_TABLET | Freq: Four times a day (QID) | ORAL | Status: DC | PRN

## 2015-04-20 MED ORDER — SODIUM CHLORIDE 0.9% FLUSH
3.0000 mL | Freq: Two times a day (BID) | INTRAVENOUS | Status: DC
  Administered 2015-04-20 (×2): 3 mL via INTRAVENOUS

## 2015-04-20 NOTE — Progress Notes (Signed)
Pt alert and oriented x4, no complaints of pain or discomfort.  Bed in low position, call bell within reach.  Bed alarms on and functioning.  Assessment done and charted.  Will continue to monitor and do hourly rounding throughout the shift 

## 2015-04-20 NOTE — Discharge Summary (Signed)
[place] [facility] [place] NAME: Douglas Newton    MR#:  [id number]  DATE OF BIRTH:  1960-08-19  DATE OF ADMISSION:  04/19/2015 ADMITTING PHYSICIAN: Lance Coon, MD  DATE OF DISCHARGE: 04/20/2015  4:52 PM  PRIMARY CARE PHYSICIAN: Maryland Pink, MD    ADMISSION DIAGNOSIS:  Syncope and collapse [R55] Acute gastroenteritis [K52.9] Left ventricular hypertrophy [I51.7] Intractable vomiting with nausea, unspecified vomiting type [R11.2]  DISCHARGE DIAGNOSIS:  Principal Problem:   Syncope and collapse Active Problems:   HTN (hypertension)   Seizure disorder (HCC)   GERD (gastroesophageal reflux disease)   Leukocytosis   SECONDARY DIAGNOSIS:   Past Medical History  Diagnosis Date  . Arthritis 2011  . Hypertension admits 2011  . Ventral hernia, unspecified, without mention of obstruction or gangrene   . Personal history of tobacco use, presenting hazards to health   . Diarrhea   . Ulcer 1975    admits 1974  . Hernia 2007    admits 2006  . Seizure disorder ([place]) 1965    admits 1964  . Obesity, unspecified   . Umbilical hernia without mention of obstruction or gangrene   . Screening for obesity   . GERD (gastroesophageal reflux disease)     HOSPITAL COURSE:   1. Syncope and collapse. Likely this is vasovagal with nausea and vomiting. Patient felt better after IV fluid hydration. 2. Likely viral gastroenteritis with nausea vomiting and diarrhea. 3. Essential hypertension blood pressure stable and not orthostatic 4. History of seizure disorder on medications 5. Hypokalemia- potassium replaced  DISCHARGE CONDITIONS:   Satisfactory  CONSULTS OBTAINED:  Treatment Team:  Teodoro Spray, MD  DRUG ALLERGIES:  No Known Allergies  DISCHARGE MEDICATIONS:   Discharge Medication List as of 04/20/2015  2:33 PM    CONTINUE these medications which have CHANGED   Details  ondansetron (ZOFRAN ODT) 8 MG disintegrating tablet  Take 1 tablet (8 mg total) by mouth every 8 (eight) hours as needed for nausea or vomiting., Starting 04/20/2015, Until Discontinued, Print      CONTINUE these medications which have NOT CHANGED   Details  carbamazepine (TEGRETOL) 200 MG tablet Take 200 mg by mouth 4 (four) times daily., Until Discontinued, Historical Med    fluticasone (FLONASE) 50 MCG/ACT nasal spray Place 2 sprays into both nostrils daily., Until Discontinued, Historical Med    hydrALAZINE (APRESOLINE) 50 MG tablet Take 50 mg by mouth 3 (three) times daily., Until Discontinued, Historical Med    loratadine (CLARITIN) 10 MG tablet Take 10 mg by mouth daily as needed for allergies., Until Discontinued, Historical Med    losartan (COZAAR) 100 MG tablet Take 100 mg by mouth daily., Until Discontinued, Historical Med    nebivolol (BYSTOLIC) 10 MG tablet Take 10 mg by mouth daily at 12 noon. , Until Discontinued, Historical Med    sildenafil (VIAGRA) 100 MG tablet Take 100 mg by mouth daily as needed for erectile dysfunction., Until Discontinued, Historical Med      STOP taking these medications     loperamide (IMODIUM) 2 MG capsule          DISCHARGE INSTRUCTIONS:   Follow-up medical doctor one week  If you experience worsening of your admission symptoms, develop shortness of breath, life threatening emergency, suicidal or homicidal thoughts you must seek medical attention immediately by calling 911 or calling your MD immediately  if symptoms less severe.  You Must read complete instructions/literature along with all the possible adverse  reactions/side effects for all the Medicines you take and that have been prescribed to you. Take any new Medicines after you have completely understood and accept all the possible adverse reactions/side effects.   Please note  You were cared for by a hospitalist during your hospital stay. If you have any questions about your discharge medications or the care you received while you  were in the hospital after you are discharged, you can call the unit and asked to speak with the hospitalist on call if the hospitalist that took care of you is not available. Once you are discharged, your primary care physician will handle any further medical issues. Please note that NO REFILLS for any discharge medications will be authorized once you are discharged, as it is imperative that you return to your primary care physician (or establish a relationship with a primary care physician if you do not have one) for your aftercare needs so that they can reassess your need for medications and monitor your lab values.    Today   CHIEF COMPLAINT:   Chief Complaint  Patient presents with  . Loss of Consciousness  . Emesis  . Diarrhea    HISTORY OF PRESENT ILLNESS:  Douglas Newton  is a 55 y.o. male and nausea vomiting and diarrhea then developed syncopal episode   VITAL SIGNS:  Blood pressure 154/75, pulse 89, temperature 98.5 F (36.9 C), temperature source Oral, resp. rate 17, height 5\' 6"  (1.676 m), weight 137.258 kg (302 lb 9.6 oz), SpO2 95 %.    PHYSICAL EXAMINATION:  GENERAL:  55 y.o.-year-old patient lying in the bed with no acute distress.  EYES: Pupils equal, round, reactive to light and accommodation. No scleral icterus. Extraocular muscles intact.  HEENT: Head atraumatic, normocephalic. Oropharynx and nasopharynx clear.  NECK:  Supple, no jugular venous distention. No thyroid enlargement, no tenderness.  LUNGS: Normal breath sounds bilaterally, no wheezing, rales,rhonchi or crepitation. No use of accessory muscles of respiration.  CARDIOVASCULAR: S1, S2 normal. No murmurs, rubs, or gallops.  ABDOMEN: Soft, non-tender, non-distended. Bowel sounds present. No organomegaly or mass.  EXTREMITIES: No pedal edema, cyanosis, or clubbing.  NEUROLOGIC: Cranial nerves II through XII are intact. Muscle strength 5/5 in all extremities. Sensation intact. Gait not checked.   PSYCHIATRIC: The patient is alert and oriented x 3.  SKIN: No obvious rash, lesion, or ulcer.   DATA REVIEW:   CBC  Recent Labs Lab 04/20/15 0920  WBC 8.1  HGB 14.1  HCT 42.1  PLT 187    Chemistries   Recent Labs Lab 04/19/15 1716 04/20/15 0920  NA 138 138  K 4.4 3.4*  CL 109 108  CO2 23 25  GLUCOSE 140* 106*  BUN 18 16  CREATININE 0.99 0.82  CALCIUM 8.8* 7.9*  MG 1.8  --   AST 32  --   ALT 21  --   ALKPHOS 75  --   BILITOT 0.6  --     Cardiac Enzymes  Recent Labs Lab 04/20/15 1345  TROPONINI <0.03      RADIOLOGY:  Dg Abd Acute W/chest  04/19/2015  CLINICAL DATA:  Acute onset abdominal pain common nausea with vomiting, and diarrhea this morning. Blurred vision. EXAM: DG ABDOMEN ACUTE W/ 1V CHEST COMPARISON:  None. FINDINGS: There is no evidence of dilated bowel loops or free intraperitoneal air. No radiopaque calculi or other significant radiographic abnormality is seen. Large patient habitus limits evaluation. Mild cardiomegaly noted. Low lung volumes are noted however both lungs are  clear. Postop changes noted from previous right thoracotomy. No evidence of pneumothorax or pleural effusion. IMPRESSION: Unremarkable bowel gas pattern.  No acute findings. Mild cardiomegaly.  No active lung disease. Electronically Signed   By: Earle Gell M.D.   On: 04/19/2015 18:42    Management plans discussed with the patient, family and they are in agreement.  CODE STATUS:     Code Status Orders        Start     Ordered   04/20/15 0151  Full code   Continuous     04/20/15 0151    Code Status History    Date Active Date Inactive Code Status Order ID Comments User Context   This patient has a current code status but no historical code status.      TOTAL TIME TAKING CARE OF THIS PATIENT: 35 minutes.    Loletha Grayer M.D on 04/20/2015 at 5:47 PM  Between 7am to 6pm - Pager - [phone]  After 6pm go to [email or web address] - password [email or web address] [facility]  [facility]  Office  [phone]  CC: Primary care physician; Maryland Pink, MD

## 2015-04-20 NOTE — Progress Notes (Signed)
*  PRELIMINARY RESULTS* Echocardiogram 2D Echocardiogram has been performed.  Douglas Newton 04/20/2015, 2:00 PM

## 2015-04-20 NOTE — Progress Notes (Signed)
Discharge: Pt d/c from room via wheelchair, Family member with the pt. Discharge instructions given to the patient and family members.  No questions from pt, reintegrated to the pt to call or go to the ED for chest discomfort. Pt dressed in street clothes and left with discharge papers and prescriptions in hand. IV d/ced, tele removed and no complaints of pain or discomfort. 

## 2015-04-20 NOTE — Consult Note (Signed)
[facility]  CARDIOLOGY CONSULT NOTE  Patient ID: Douglas Newton MRN: [id number] DOB/AGE: 54/21/1962 54 y.o.  Admit date: 04/19/2015 Referring Physician Dr. Leslye Peer Primary Physician Dr. Wandra Scot Primary Cardiologist Dr. Ubaldo Glassing Reason for Consultation syncope  HPI: Pt is a 55 yo male who has a history hypertensiontreated with Bystolic at 10 mg daily,  hydralazine at 50 mg 3 times daily and hctz.Marland Kitchen He presented to acute care with approximately one day of nasusea and diarrhea. He was dizzy in acute care and was sent to er. When standing up in the er, he had a syncopal episode. EKG in er had limb lead reversal and showed t wave inversion in lateral leads.  EKG as out patient shows sinus rhythm at a rate of 68 with a PR interval 174 ms with a QRS duration of 90 ms and a QTC of 459 ms with a QRS axis of 5. He has LVH with repolarization abnormalities. He has ruled out for mi and has had no arrhythmia since admission. He denies sob. He denies chest pain. He has history of normal lv function by echo in 2015 with lvh.   Review of Systems  Constitutional: Negative.   HENT: Negative.   Eyes: Negative.   Respiratory: Negative.   Cardiovascular: Negative.   Gastrointestinal: Positive for nausea, vomiting, abdominal pain and diarrhea.  Genitourinary: Negative.   Musculoskeletal: Negative.   Skin: Negative.   Neurological: Positive for dizziness and loss of consciousness.  Endo/Heme/Allergies: Negative.     Past Medical History  Diagnosis Date  . Arthritis 2011  . Hypertension admits 2011  . Ventral hernia, unspecified, without mention of obstruction or gangrene   . Personal history of tobacco use, presenting hazards to health   . Diarrhea   . Ulcer 1975    admits 1974  . Hernia 2007    admits 2006  . Seizure disorder ([place]) 1965    admits 1964  . Obesity, unspecified   . Umbilical hernia without mention of obstruction or gangrene   . Screening  for obesity   . GERD (gastroesophageal reflux disease)     Family History  Problem Relation Age of Onset  . Heart attack Maternal Grandfather   . Heart attack Maternal Grandmother   . Heart attack Mother   . Hypertension Paternal Grandfather   . Hypertension Paternal Grandmother     Social History   Social History  . Marital Status: Married    Spouse Name: N/A  . Number of Children: N/A  . Years of Education: N/A   Occupational History  . Not on file.   Social History Main Topics  . Smoking status: Former Smoker -- 1.00 packs/day for 1 years  . Smokeless tobacco: Never Used  . Alcohol Use: Yes  . Drug Use: No  . Sexual Activity: Not on file   Other Topics Concern  . Not on file   Social History Narrative   ** Merged History Encounter **        Past Surgical History  Procedure Laterality Date  . Abdominal surgery  1965  . Knee surgery  October 2011    left knee  . Knee arthroscopy  October 1983    right knee  . Lobectomy  May 1965  . Wires removed from under arm  1963  . Arm surgery  1969  . Stomach tube  1965  . Hernia repair  December 2013    ventral hernia,umbilical hernia  Prescriptions prior to admission  Medication Sig Dispense Refill Last Dose  . carbamazepine (TEGRETOL) 200 MG tablet Take 200 mg by mouth 4 (four) times daily.   04/19/2015 at Unknown time  . fluticasone (FLONASE) 50 MCG/ACT nasal spray Place 2 sprays into both nostrils daily.   04/19/2015 at Unknown time  . hydrALAZINE (APRESOLINE) 50 MG tablet Take 50 mg by mouth 3 (three) times daily.   04/19/2015 at Unknown time  . loratadine (CLARITIN) 10 MG tablet Take 10 mg by mouth daily as needed for allergies.   Past Month at Unknown time  . losartan (COZAAR) 100 MG tablet Take 100 mg by mouth daily.   04/19/2015 at Unknown time  . nebivolol (BYSTOLIC) 10 MG tablet Take 10 mg by mouth daily at 12 noon.    04/18/2015 at Unknown time  . sildenafil (VIAGRA) 100 MG tablet Take 100 mg by mouth daily  as needed for erectile dysfunction.   PRN at PRN    Physical Exam: Blood pressure 165/74, pulse 79, temperature 98.3 F (36.8 C), temperature source Oral, resp. rate 17, height 5\' 6"  (1.676 m), weight 137.258 kg (302 lb 9.6 oz), SpO2 99 %.   Wt Readings from Last 1 Encounters:  04/20/15 137.258 kg (302 lb 9.6 oz)     General appearance: alert and cooperative Head: Normocephalic, without obvious abnormality, atraumatic Resp: clear to auscultation bilaterally Cardio: regular rate and rhythm GI: soft, non-tender; bowel sounds normal; no masses,  no organomegaly Extremities: extremities normal, atraumatic, no cyanosis or edema Neurologic: Grossly normal  Labs:   Lab Results  Component Value Date   WBC 12.0* 04/20/2015   HGB 14.8 04/20/2015   HCT 44.6 04/20/2015   MCV 89.4 04/20/2015   PLT 181 04/20/2015    Recent Labs Lab 04/19/15 1716  NA 138  K 4.4  CL 109  CO2 23  BUN 18  CREATININE 0.99  CALCIUM 8.8*  PROT 6.9  BILITOT 0.6  ALKPHOS 75  ALT 21  AST 32  GLUCOSE 140*   Lab Results  Component Value Date   TROPONINI <0.03 04/20/2015      Radiology: cxr showed mild cardiomegaly with no acticve lung disease EKG: EKG showed nsr. Appears to have limb leads reversal. T wave inversion s/o lvh.   ASSESSMENT AND PLAN:  EKG showed nsr. Appears to have limb leads reversal. T wave inversion s/o lvh. These changes were previously present. Pt has ruled out for mi. No chest pain. No arrhythmia noted on tele. Episodes of diziness and syncope appear to have been related to vasovagal with nausea vs orthostatic changes due to volume depletion in presence of diarrhea, vomiting and anorexia. Would continue to hydrate and will review echo when avaialble. Ambulate this this am and if stable would consider discharge with early outpatient follow up in our office. Would discharge on bystollic 10 mg daily and hydralazine at 50 mg tid. Defer hctz for now.  Signed: Teodoro Spray MD,  [facility] 04/20/2015, 9:50 AM

## 2015-04-20 NOTE — Care Management (Signed)
Patient sent to ED from PCP office.  He had been having nausea vomiting and abdominal pain.  Patient placed in observation after full syncopal episode in ED.  At present time, do not see documentation of orthostatic vital signs.  Does have documentation of systolic blood pressure of > 200.   Prior to this event, patient independent in all his adls and no issues accessing medical care.

## 2015-04-20 NOTE — Progress Notes (Signed)
Notified Dr.Willis of elevated blood pressure, new orders entered.

## 2015-04-20 NOTE — ED Notes (Signed)
Attempted to call report x2.  Floor having emergency, will call when available to take report.

## 2016-09-10 ENCOUNTER — Telehealth: Payer: Self-pay | Admitting: *Deleted

## 2016-09-10 NOTE — Telephone Encounter (Signed)
Called patient to schedule colonoscopy with Dr.Byrnett. Patient is being referred by Marthe Patch. His last one was in 2013 by Dr.Byrnett. He is going to call back after he takes with his supervisor at work to see when he can take off.

## 2016-09-16 ENCOUNTER — Encounter: Payer: Self-pay | Admitting: General Surgery

## 2016-10-07 ENCOUNTER — Encounter: Payer: Self-pay | Admitting: *Deleted

## 2016-10-08 ENCOUNTER — Ambulatory Visit (INDEPENDENT_AMBULATORY_CARE_PROVIDER_SITE_OTHER): Payer: 59 | Admitting: General Surgery

## 2016-10-08 ENCOUNTER — Encounter: Payer: Self-pay | Admitting: General Surgery

## 2016-10-08 VITALS — BP 146/86 | HR 58 | Resp 16 | Ht 66.0 in | Wt 285.0 lb

## 2016-10-08 DIAGNOSIS — Z86010 Personal history of colonic polyps: Secondary | ICD-10-CM

## 2016-10-08 NOTE — Progress Notes (Addendum)
Patient ID: Douglas Newton, male   DOB: 04/13/1960, 56 y.o.   MRN: [id number]  Chief Complaint  Patient presents with  . Colonoscopy    HPI Douglas Newton is a 56 y.o. male.  Who presents for a colonoscopy discussion. The last colonoscopy was completed in 2013. Denies any gastrointestinal issues. Bowels move regular and no bleeding noted.  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  The patient had a grand mal seizure 29 years ago. No recurrence since initiation of Tegretol therapy.  HPI  Past Medical History:  Diagnosis Date  . Arthritis 2011  . Colon polyp 01/15/2012  . Diarrhea   . GERD (gastroesophageal reflux disease)   . Hernia 2007   admits 2006  . Hypertension admits 2011  . Obesity, unspecified   . Personal history of tobacco use, presenting hazards to health   . Screening for obesity   . Seizure disorder ([place]) 1965   admits 1964  . Ulcer 1975   admits 1974  . Umbilical hernia without mention of obstruction or gangrene   . Ventral hernia, unspecified, without mention of obstruction or gangrene     Past Surgical History:  Procedure Laterality Date  . ABDOMINAL SURGERY  1965  . arm surgery  1969  . COLONOSCOPY  01/15/2012   Dr Bary Castilla  . HERNIA REPAIR  December 2013   ventral hernia,umbilical hernia  . KNEE ARTHROSCOPY  October 1983   right knee  . KNEE SURGERY  October 2011   left knee  . LOBECTOMY  May 1965  . stomach tube  1965  . wires removed from under arm  1963    Family History  Problem Relation Age of Onset  . Heart attack Maternal Grandfather   . Heart attack Maternal Grandmother   . Heart attack Mother   . Hypertension Paternal Grandfather   . Hypertension Paternal Grandmother     Social History Social History  Substance Use Topics  . Smoking status: Former Smoker    Packs/day: 1.00    Years: 1.00  . Smokeless tobacco: Never Used  . Alcohol use Yes    No Known Allergies  Current Outpatient Prescriptions  Medication Sig  Dispense Refill  . carbamazepine (TEGRETOL) 200 MG tablet Take 200 mg by mouth 4 (four) times daily.    . fluticasone (FLONASE) 50 MCG/ACT nasal spray Place 2 sprays into both nostrils daily.    . hydrALAZINE (APRESOLINE) 50 MG tablet Take 50 mg by mouth 3 (three) times daily.    Marland Kitchen losartan (COZAAR) 100 MG tablet Take 100 mg by mouth daily.    . nebivolol (BYSTOLIC) 10 MG tablet Take 10 mg by mouth daily at 12 noon.     . sildenafil (VIAGRA) 100 MG tablet Take 100 mg by mouth daily as needed for erectile dysfunction.     No current facility-administered medications for this visit.     Review of Systems Review of Systems  Constitutional: Negative.   Respiratory: Negative.   Cardiovascular: Negative.   Gastrointestinal: Negative for blood in stool, constipation and diarrhea.    Blood pressure (!) 146/86, pulse (!) 58, resp. rate 16, height 5\' 6"  (1.676 m), weight 285 lb (129.3 kg).  Physical Exam Physical Exam  Constitutional: He is oriented to person, place, and time. He appears well-developed and well-nourished.  HENT:  Mouth/Throat: Oropharynx is clear and moist.  Eyes: Conjunctivae are normal. No scleral icterus.  Neck: Neck supple.  Cardiovascular: Normal rate, regular rhythm and normal heart sounds.  Pulmonary/Chest: Effort normal and breath sounds normal.  Lymphadenopathy:    He has no cervical adenopathy.  Neurological: He is alert and oriented to person, place, and time.  Skin: Skin is warm and dry.  Psychiatric: His behavior is normal.    Data Reviewed 01/15/2012 colonoscopy completed for diarrhea: Diagnosis:  Part A: RIGHT COLON COLD BIOPSY:  - NEGATIVE FOR COLITIS.  Marland Kitchen  Part B: DISTAL TRANSVERSE COLON COLD BIOPSIES:  - NEGATIVE FOR COLITIS.  Marland Kitchen  Part C: DESCENDING COLON COLD BIOPSIES:  - NEGATIVE FOR COLITIS.  Marland Kitchen  Part D: SIGMOID COLON COLD BIOPSIES:  - NEGATIVE FOR COLITIS.  Marland Kitchen  Part E: RECTOSIGMOID POLYP HOT SNARE:  - TUBULAR ADENOMA, MULTIPLE FRAGMENTS.   - NEGATIVE FOR HIGH GRADE DYSPLASIA AND MALIGNANCY.  Rectosigmoid polyp hot snare: Received in a formalin filled  container labeled Douglas Newton is a 2.0 x 1.1 x 0.2 cm  aggregate of soft tan fragments of tissue ranging from barely  perceptible up to 0.6 cm in greatest dimensions.   Laboratory studies dated 09/05/2016 showed a hemoglobin of 13.6 with an MCV of 88, white blood cell count of 4800, platelet count of 208,000. Comprehensive metabolic panel of the same date showed a random blood sugar of 121, creatinine 0.9, estimated GFR 111, normal electrolytes with a potassium of 3.8. Normal liver function studies. Normalization of previously noted mild elevation of the AST.  Assessment    Past history colon polyp. Candidate for follow-up.    Plan        Colonoscopy with possible biopsy/polypectomy prn: Information regarding the procedure, including its potential risks and complications (including but not limited to perforation of the bowel, which may require emergency surgery to repair, and bleeding) was verbally given to the patient. Educational information regarding lower intestinal endoscopy was given to the patient. Written instructions for how to complete the bowel prep using Miralax were provided. The importance of drinking ample fluids to avoid dehydration as a result of the prep emphasized.   HPI, Physical Exam, Assessment and Plan have been scribed under the direction and in the presence of Robert Bellow, MD. Karie Fetch, RN  I have completed the exam and reviewed the above documentation for accuracy and completeness.  I agree with the above.  Haematologist has been used and any errors in dictation or transcription are unintentional.  Hervey Ard, M.D., F.A.C.S. [place], [place] 10/08/2016, 9:38 PM

## 2016-10-08 NOTE — Patient Instructions (Signed)
Colonoscopy, Adult A colonoscopy is an exam to look at the entire large intestine. During the exam, a lubricated, bendable tube is inserted into the anus and then passed into the rectum, colon, and other parts of the large intestine. A colonoscopy is often done as a part of normal colorectal screening or in response to certain symptoms, such as anemia, persistent diarrhea, abdominal pain, and blood in the stool. The exam can help screen for and diagnose medical problems, including:  Tumors.  Polyps.  Inflammation.  Areas of bleeding.  Tell a health care provider about:  Any allergies you have.  All medicines you are taking, including vitamins, herbs, eye drops, creams, and over-the-counter medicines.  Any problems you or family members have had with anesthetic medicines.  Any blood disorders you have.  Any surgeries you have had.  Any medical conditions you have.  Any problems you have had passing stool. What are the risks? Generally, this is a safe procedure. However, problems may occur, including:  Bleeding.  A tear in the intestine.  A reaction to medicines given during the exam.  Infection (rare).  What happens before the procedure? Eating and drinking restrictions Follow instructions from your health care provider about eating and drinking, which may include:  A few days before the procedure - follow a low-fiber diet. Avoid nuts, seeds, dried fruit, raw fruits, and vegetables.  1-3 days before the procedure - follow a clear liquid diet. Drink only clear liquids, such as clear broth or bouillon, black coffee or tea, clear juice, clear soft drinks or sports drinks, gelatin dessert, and popsicles. Avoid any liquids that contain red or purple dye.  On the day of the procedure - do not eat or drink anything during the 2 hours before the procedure, or within the time period that your health care provider recommends.  Bowel prep If you were prescribed an oral bowel prep  to clean out your colon:  Take it as told by your health care provider. Starting the day before your procedure, you will need to drink a large amount of medicated liquid. The liquid will cause you to have multiple loose stools until your stool is almost clear or light green.  If your skin or anus gets irritated from diarrhea, you may use these to relieve the irritation: ? Medicated wipes, such as adult wet wipes with aloe and vitamin E. ? A skin soothing-product like petroleum jelly.  If you vomit while drinking the bowel prep, take a break for up to 60 minutes and then begin the bowel prep again. If vomiting continues and you cannot take the bowel prep without vomiting, call your health care provider.  General instructions  Ask your health care provider about changing or stopping your regular medicines. This is especially important if you are taking diabetes medicines or blood thinners.  Plan to have someone take you home from the hospital or clinic. What happens during the procedure?  An IV tube may be inserted into one of your veins.  You will be given medicine to help you relax (sedative).  To reduce your risk of infection: ? Your health care team will wash or sanitize their hands. ? Your anal area will be washed with soap.  You will be asked to lie on your side with your knees bent.  Your health care provider will lubricate a long, thin, flexible tube. The tube will have a camera and a light on the end.  The tube will be inserted into your   anus.  The tube will be gently eased through your rectum and colon.  Air will be delivered into your colon to keep it open. You may feel some pressure or cramping.  The camera will be used to take images during the procedure.  A small tissue sample may be removed from your body to be examined under a microscope (biopsy). If any potential problems are found, the tissue will be sent to a lab for testing.  If small polyps are found, your  health care provider may remove them and have them checked for cancer cells.  The tube that was inserted into your anus will be slowly removed. The procedure may vary among health care providers and hospitals. What happens after the procedure?  Your blood pressure, heart rate, breathing rate, and blood oxygen level will be monitored until the medicines you were given have worn off.  Do not drive for 24 hours after the exam.  You may have a small amount of blood in your stool.  You may pass gas and have mild abdominal cramping or bloating due to the air that was used to inflate your colon during the exam.  It is up to you to get the results of your procedure. Ask your health care provider, or the department performing the procedure, when your results will be ready. This information is not intended to replace advice given to you by your health care provider. Make sure you discuss any questions you have with your health care provider. Document Released: 01/12/2000 Document Revised: 11/15/2015 Document Reviewed: 03/28/2015 Elsevier Interactive Patient Education  2018 [employer] Inc[employer]  

## 2016-10-31 ENCOUNTER — Telehealth: Payer: Self-pay | Admitting: *Deleted

## 2016-10-31 MED ORDER — POLYETHYLENE GLYCOL 3350 17 GM/SCOOP PO POWD: ORAL | 0 refills | Status: DC

## 2016-10-31 NOTE — Telephone Encounter (Signed)
Patient has been scheduled for a colonoscopy on 12-04-16 at [facility]. Miralax prescription has been sent in to the patient's pharmacy today. Colonoscopy instructions have been reviewed with the patient. This patient is aware to call the office if they have further questions.

## 2016-11-27 ENCOUNTER — Other Ambulatory Visit: Payer: Self-pay | Admitting: General Surgery

## 2016-11-27 ENCOUNTER — Telehealth: Payer: Self-pay | Admitting: *Deleted

## 2016-11-27 DIAGNOSIS — Z86010 Personal history of colonic polyps: Secondary | ICD-10-CM

## 2016-11-27 NOTE — Telephone Encounter (Signed)
Patient was contacted today and confirms no medication changes since his last office visit.   This patient reports that he has picked up Miralax prescription.  We will proceed with colonoscopy as scheduled for 12-04-16  at [facility].   Patient was encouraged to call the office should he have further questions.

## 2016-12-04 ENCOUNTER — Ambulatory Visit
Admission: RE | Admit: 2016-12-04 | Discharge: 2016-12-04 | Disposition: A | Discharge status: Home / Self Care | Payer: 59 | Source: Ambulatory Visit | Attending: General Surgery | Admitting: General Surgery

## 2016-12-04 ENCOUNTER — Ambulatory Visit: Payer: 59 | Admitting: Anesthesiology

## 2016-12-04 ENCOUNTER — Encounter: Payer: Self-pay | Admitting: *Deleted

## 2016-12-04 ENCOUNTER — Encounter
Admission: RE | Disposition: A | Discharge status: Home / Self Care | Payer: Self-pay | Source: Ambulatory Visit | Attending: General Surgery

## 2016-12-04 DIAGNOSIS — Z86010 Personal history of colonic polyps: Secondary | ICD-10-CM | POA: Diagnosis not present

## 2016-12-04 DIAGNOSIS — K219 Gastro-esophageal reflux disease without esophagitis: Secondary | ICD-10-CM | POA: Insufficient documentation

## 2016-12-04 DIAGNOSIS — D120 Benign neoplasm of cecum: Secondary | ICD-10-CM | POA: Diagnosis not present

## 2016-12-04 DIAGNOSIS — I10 Essential (primary) hypertension: Secondary | ICD-10-CM | POA: Insufficient documentation

## 2016-12-04 DIAGNOSIS — D122 Benign neoplasm of ascending colon: Secondary | ICD-10-CM | POA: Insufficient documentation

## 2016-12-04 DIAGNOSIS — D123 Benign neoplasm of transverse colon: Secondary | ICD-10-CM | POA: Diagnosis not present

## 2016-12-04 DIAGNOSIS — D125 Benign neoplasm of sigmoid colon: Secondary | ICD-10-CM | POA: Insufficient documentation

## 2016-12-04 DIAGNOSIS — E669 Obesity, unspecified: Secondary | ICD-10-CM | POA: Insufficient documentation

## 2016-12-04 DIAGNOSIS — Z1211 Encounter for screening for malignant neoplasm of colon: Secondary | ICD-10-CM | POA: Insufficient documentation

## 2016-12-04 DIAGNOSIS — Z87891 Personal history of nicotine dependence: Secondary | ICD-10-CM | POA: Insufficient documentation

## 2016-12-04 DIAGNOSIS — Z79899 Other long term (current) drug therapy: Secondary | ICD-10-CM | POA: Insufficient documentation

## 2016-12-04 DIAGNOSIS — Z6841 Body Mass Index (BMI) 40.0 and over, adult: Secondary | ICD-10-CM | POA: Diagnosis not present

## 2016-12-04 DIAGNOSIS — Z7951 Long term (current) use of inhaled steroids: Secondary | ICD-10-CM | POA: Insufficient documentation

## 2016-12-04 DIAGNOSIS — G40909 Epilepsy, unspecified, not intractable, without status epilepticus: Secondary | ICD-10-CM | POA: Diagnosis not present

## 2016-12-04 HISTORY — PX: COLONOSCOPY WITH PROPOFOL: SHX5780

## 2016-12-04 SURGERY — COLONOSCOPY WITH PROPOFOL
Anesthesia: General

## 2016-12-04 MED ORDER — PROPOFOL 10 MG/ML IV BOLUS
INTRAVENOUS | Status: DC | PRN
  Administered 2016-12-04: 120 mg via INTRAVENOUS

## 2016-12-04 MED ORDER — PROPOFOL 500 MG/50ML IV EMUL
INTRAVENOUS | Status: DC | PRN
  Administered 2016-12-04: 140 ug/kg/min via INTRAVENOUS

## 2016-12-04 MED ORDER — SODIUM CHLORIDE 0.9 % IV SOLN
INTRAVENOUS | Status: DC
  Administered 2016-12-04: 1000 mL via INTRAVENOUS

## 2016-12-04 MED ORDER — PROPOFOL 500 MG/50ML IV EMUL
INTRAVENOUS | Status: AC
  Filled 2016-12-04: qty 50

## 2016-12-04 MED ORDER — PROPOFOL 10 MG/ML IV BOLUS
INTRAVENOUS | Status: AC
  Filled 2016-12-04: qty 20

## 2016-12-04 NOTE — Anesthesia Postprocedure Evaluation (Signed)
Anesthesia Post Note  Patient: Douglas Newton  Procedure(s) Performed: COLONOSCOPY WITH PROPOFOL (N/A )  Patient location during evaluation: Endoscopy Anesthesia Type: General Level of consciousness: awake and alert Pain management: pain level controlled Vital Signs Assessment: post-procedure vital signs reviewed and stable Respiratory status: spontaneous breathing, nonlabored ventilation, respiratory function stable and patient connected to nasal cannula oxygen Cardiovascular status: blood pressure returned to baseline and stable Postop Assessment: no apparent nausea or vomiting Anesthetic complications: no     Last Vitals:  Vitals:   12/04/16 1240 12/04/16 1250  BP: (!) 168/94 (!) 159/91  Pulse: (!) 49 (!) 56  Resp: 16 17  Temp:    SpO2: 98% 100%    Last Pain:  Vitals:   12/04/16 1220  TempSrc: Tympanic                 [name]

## 2016-12-04 NOTE — Anesthesia Preprocedure Evaluation (Signed)
Anesthesia Evaluation  Patient identified by MRN, date of birth, ID band Patient awake    Reviewed: Allergy & Precautions, H&P , NPO status , Patient's Chart, lab work & pertinent test results  History of Anesthesia Complications Negative for: history of anesthetic complications  Airway Mallampati: III  TM Distance: <3 FB Neck ROM: full    Dental  (+) Chipped, Poor Dentition   Pulmonary neg shortness of breath, former smoker,           Cardiovascular Exercise Tolerance: Good hypertension, (-) angina(-) Past MI and (-) DOE      Neuro/Psych Seizures -,  negative psych ROS   GI/Hepatic Neg liver ROS, GERD  Medicated and Controlled,  Endo/Other  negative endocrine ROS  Renal/GU negative Renal ROS  negative genitourinary   Musculoskeletal  (+) Arthritis ,   Abdominal   Peds  Hematology negative hematology ROS (+)   Anesthesia Other Findings  Signs and symptoms suggestive of sleep apnea   Past Medical History: 2011: Arthritis 01/15/2012: Colon polyp No date: Diarrhea No date: GERD (gastroesophageal reflux disease) 2007: Hernia     Comment:  admits 2006 admits 2011: Hypertension No date: Obesity, unspecified No date: Personal history of tobacco use, presenting hazards to health No date: Screening for obesity 1965: Seizure disorder ([place])     Comment:  admits 1964 1975: Ulcer     Comment:  admits 3710 No date: Umbilical hernia without mention of obstruction or gangrene No date: Ventral hernia, unspecified, without mention of obstruction  or gangrene  Past Surgical History: 1965: ABDOMINAL SURGERY 1969: arm surgery 01/15/2012: COLONOSCOPY     Comment:  Dr Bary Castilla December 2013: HERNIA REPAIR     Comment:  ventral hernia,umbilical hernia October 1983: KNEE ARTHROSCOPY     Comment:  right knee October 2011: KNEE SURGERY     Comment:  left knee May 1965: LOBECTOMY 1965: stomach tube 1963: wires removed  from under arm  BMI    Body Mass Index:  43.58 kg/m      Reproductive/Obstetrics negative OB ROS                             Anesthesia Physical Anesthesia Plan  ASA: III  Anesthesia Plan: General   Post-op Pain Management:    Induction: Intravenous  PONV Risk Score and Plan: 2 and Propofol infusion  Airway Management Planned: Natural Airway and Nasal Cannula  Additional Equipment:   Intra-op Plan:   Post-operative Plan:   Informed Consent: I have reviewed the patients History and Physical, chart, labs and discussed the procedure including the risks, benefits and alternatives for the proposed anesthesia with the patient or authorized representative who has indicated his/her understanding and acceptance.   Dental Advisory Given  Plan Discussed with: Anesthesiologist, CRNA and Surgeon  Anesthesia Plan Comments: (Patient consented for risks of anesthesia including but not limited to:  - adverse reactions to medications - risk of intubation if required - damage to teeth, lips or other oral mucosa - sore throat or hoarseness - Damage to heart, brain, lungs or loss of life  Patient voiced understanding.)        Anesthesia Quick Evaluation

## 2016-12-04 NOTE — Anesthesia Post-op Follow-up Note (Signed)
Anesthesia QCDR form completed.        

## 2016-12-04 NOTE — Transfer of Care (Signed)
Immediate Anesthesia Transfer of Care Note  Patient: Douglas Newton  Procedure(s) Performed: COLONOSCOPY WITH PROPOFOL (N/A )  Patient Location: Endoscopy Unit  Anesthesia Type:General  Level of Consciousness: awake  Airway & Oxygen Therapy: Patient Spontanous Breathing and Patient connected to nasal cannula oxygen  Post-op Assessment: Report given to RN and Post -op Vital signs reviewed and stable  Post vital signs: Reviewed and stable  Last Vitals:  Vitals:   12/04/16 1050  BP: (!) 173/92  Pulse: (!) 54  Resp: (!) 24  Temp: (!) 36.3 C  SpO2: 100%    Last Pain:  Vitals:   12/04/16 1050  TempSrc: Tympanic         Complications: No apparent anesthesia complications

## 2016-12-04 NOTE — Op Note (Signed)
[facility] Patient Name: Douglas Newton Procedure Date: 12/04/2016 11:29 AM MRN: [id number] Account #: [id number] Date of Birth: 1960/10/22 Admit Type: Outpatient Age: 56 Room: [facility] ENDO ROOM 1 Gender: Male Note Status: Finalized Procedure:            Colonoscopy Indications:          High risk colon cancer surveillance: Personal history                        of colonic polyps Providers:            Robert Bellow, MD Referring MD:         Irven Easterly. Kary Kos, MD (Referring MD) Medicines:            Monitored Anesthesia Care Complications:        No immediate complications. Procedure:            Pre-Anesthesia Assessment:                       - Prior to the procedure, a History and Physical was                        performed, and patient medications, allergies and                        sensitivities were reviewed. The patient's tolerance of                        previous anesthesia was reviewed.                       - The risks and benefits of the procedure and the                        sedation options and risks were discussed with the                        patient. All questions were answered and informed                        consent was obtained.                       After obtaining informed consent, the colonoscope was                        passed under direct vision. Throughout the procedure,                        the patient's blood pressure, pulse, and oxygen                        saturations were monitored continuously. The Olympus                        CF-H180AL colonoscope ( S#: [id number] ) was introduced                        through the anus and advanced to the the cecum,  identified by appendiceal orifice and ileocecal valve.                        The colonoscopy was performed without difficulty. The                        patient tolerated the procedure well. The quality of       the bowel preparation was excellent. Findings:      A 10 mm polyp was found in the cecum. The polyp was sessile. The polyp       was removed with a hot snare. Resection and retrieval were complete.      A 12 mm polyp was found in the ascending colon proximal ascending colon.       The polyp was sessile. The polyp was removed with a hot snare. Resection       and retrieval were complete.      A 5 mm polyp was found in the transverse colon mid transverse colon. The       polyp was sessile. Biopsies were taken with a cold forceps for histology.      A 14 mm polyp was found in the recto-sigmoid colon. The polyp was       semi-pedunculated. The polyp was removed with a hot snare. Resection and       retrieval were complete.      The retroflexed view of the distal rectum and anal verge was normal and       showed no anal or rectal abnormalities. Impression:           - One 10 mm polyp in the cecum, removed with a hot                        snare. Resected and retrieved.                       - One 12 mm polyp in the ascending colon in the                        proximal ascending colon, removed with a hot snare.                        Resected and retrieved.                       - One 5 mm polyp in the transverse colon in the mid                        transverse colon. Biopsied.                       - One 14 mm polyp at the recto-sigmoid colon, removed                        with a hot snare. Resected and retrieved.                       - The distal rectum and anal verge are normal on                        retroflexion view. Recommendation:       -  Telephone endoscopist for pathology results in 1 week. Robert Bellow, MD 12/04/2016 12:20:32 PM This report has been signed electronically. Number of Addenda: 0 Note Initiated On: 12/04/2016 11:29 AM Scope Withdrawal Time: 0 hours 26 minutes 21 seconds  Total Procedure Duration: 0 hours 32 minutes 8 seconds       [facility]

## 2016-12-04 NOTE — H&P (Signed)
MIRANDA GARBER [id number] 11/08/1960     HPI:  Healthy 56 y/o male s/p resection of a tubular adenoma from the rectum in 2013.  For follow up exam. Tolerated prep well.    Medications Prior to Admission  Medication Sig Dispense Refill Last Dose  . carbamazepine (TEGRETOL) 200 MG tablet Take 200 mg by mouth 4 (four) times daily.   12/04/2016 at Unknown time  . losartan (COZAAR) 100 MG tablet Take 100 mg by mouth daily.   12/04/2016 at Unknown time  . nebivolol (BYSTOLIC) 10 MG tablet Take 10 mg by mouth daily at 12 noon.    12/03/2016 at Unknown time  . fluticasone (FLONASE) 50 MCG/ACT nasal spray Place 2 sprays into both nostrils daily.   Taking  . hydrALAZINE (APRESOLINE) 50 MG tablet Take 50 mg by mouth 3 (three) times daily.   Taking  . polyethylene glycol powder (GLYCOLAX/MIRALAX) powder 255 grams one bottle for colonoscopy prep 255 g 0   . sildenafil (VIAGRA) 100 MG tablet Take 100 mg by mouth daily as needed for erectile dysfunction.   Taking   No Known Allergies Past Medical History:  Diagnosis Date  . Arthritis 2011  . Colon polyp 01/15/2012  . Diarrhea   . GERD (gastroesophageal reflux disease)   . Hernia 2007   admits 2006  . Hypertension admits 2011  . Obesity, unspecified   . Personal history of tobacco use, presenting hazards to health   . Screening for obesity   . Seizure disorder ([place]) 1965   admits 1964  . Ulcer 1975   admits 1974  . Umbilical hernia without mention of obstruction or gangrene   . Ventral hernia, unspecified, without mention of obstruction or gangrene    Past Surgical History:  Procedure Laterality Date  . ABDOMINAL SURGERY  1965  . arm surgery  1969  . COLONOSCOPY  01/15/2012   Dr Bary Castilla  . HERNIA REPAIR  December 2013   ventral hernia,umbilical hernia  . KNEE ARTHROSCOPY  October 1983   right knee  . KNEE SURGERY  October 2011   left knee  . LOBECTOMY  May 1965  . stomach tube  1965  . wires removed from under arm  1963   Social  History   Socioeconomic History  . Marital status: Widowed    Spouse name: Not on file  . Number of children: Not on file  . Years of education: Not on file  . Highest education level: Not on file  Social Needs  . Financial resource strain: Not on file  . Food insecurity - worry: Not on file  . Food insecurity - inability: Not on file  . Transportation needs - medical: Not on file  . Transportation needs - non-medical: Not on file  Occupational History  . Not on file  Tobacco Use  . Smoking status: Former Smoker    Packs/day: 1.00    Years: 1.00    Pack years: 1.00  . Smokeless tobacco: Never Used  Substance and Sexual Activity  . Alcohol use: Yes  . Drug use: No  . Sexual activity: Not on file  Other Topics Concern  . Not on file  Social History Narrative   ** Merged History Encounter **       Social History   Social History Narrative   ** Merged History Encounter **         ROS: Negative.     PE: HEENT: Negative. Lungs: Clear. Cardio: RR.  Assessment/Plan:  Proceed  with planned endoscopy.  [place], Fabela 12/04/2016

## 2016-12-05 ENCOUNTER — Encounter: Payer: Self-pay | Admitting: General Surgery

## 2016-12-05 ENCOUNTER — Telehealth: Payer: Self-pay

## 2016-12-05 LAB — SURGICAL PATHOLOGY

## 2016-12-05 NOTE — Telephone Encounter (Signed)
-----   Message from Robert Bellow, MD sent at 12/05/2016 12:16 PM EST ----- Please notify the patient that all the polyps were benign, but he should plan on a f/u exam in three years. Thanks.  ----- Message ----- From: Interface, Lab In Three Zero One Sent: 12/05/2016  10:16 AM To: Robert Bellow, MD

## 2016-12-05 NOTE — Telephone Encounter (Signed)
Notified patient as instructed, patient pleased. Discussed follow-up appointments, patient agrees. Patient placed in recalls.   

## 2017-02-04 ENCOUNTER — Encounter: Payer: Self-pay | Admitting: General Surgery

## 2018-09-01 ENCOUNTER — Encounter: Payer: Self-pay | Admitting: General Surgery

## 2019-04-13 ENCOUNTER — Emergency Department: Payer: Managed Care, Other (non HMO)

## 2019-04-13 ENCOUNTER — Inpatient Hospital Stay: Payer: Managed Care, Other (non HMO)

## 2019-04-13 ENCOUNTER — Inpatient Hospital Stay
Admit: 2019-04-13 | Discharge: 2019-04-13 | Disposition: A | Discharge status: Home / Self Care | Payer: Managed Care, Other (non HMO) | Attending: Family Medicine | Admitting: Family Medicine

## 2019-04-13 ENCOUNTER — Inpatient Hospital Stay
Admission: EM | Admit: 2019-04-13 | Discharge: 2019-04-14 | DRG: 309 | Disposition: A | Discharge status: Home / Self Care | Payer: Managed Care, Other (non HMO) | Attending: Hospitalist | Admitting: Hospitalist

## 2019-04-13 ENCOUNTER — Other Ambulatory Visit: Payer: Self-pay

## 2019-04-13 DIAGNOSIS — Z20822 Contact with and (suspected) exposure to covid-19: Secondary | ICD-10-CM | POA: Diagnosis present

## 2019-04-13 DIAGNOSIS — K219 Gastro-esophageal reflux disease without esophagitis: Secondary | ICD-10-CM | POA: Diagnosis present

## 2019-04-13 DIAGNOSIS — E785 Hyperlipidemia, unspecified: Secondary | ICD-10-CM | POA: Diagnosis present

## 2019-04-13 DIAGNOSIS — E871 Hypo-osmolality and hyponatremia: Secondary | ICD-10-CM | POA: Diagnosis present

## 2019-04-13 DIAGNOSIS — I959 Hypotension, unspecified: Secondary | ICD-10-CM

## 2019-04-13 DIAGNOSIS — E861 Hypovolemia: Secondary | ICD-10-CM | POA: Diagnosis present

## 2019-04-13 DIAGNOSIS — E669 Obesity, unspecified: Secondary | ICD-10-CM | POA: Diagnosis present

## 2019-04-13 DIAGNOSIS — G40909 Epilepsy, unspecified, not intractable, without status epilepticus: Secondary | ICD-10-CM | POA: Diagnosis present

## 2019-04-13 DIAGNOSIS — R001 Bradycardia, unspecified: Secondary | ICD-10-CM | POA: Diagnosis present

## 2019-04-13 DIAGNOSIS — E876 Hypokalemia: Secondary | ICD-10-CM

## 2019-04-13 DIAGNOSIS — Z87891 Personal history of nicotine dependence: Secondary | ICD-10-CM | POA: Diagnosis not present

## 2019-04-13 DIAGNOSIS — W19XXXA Unspecified fall, initial encounter: Secondary | ICD-10-CM

## 2019-04-13 DIAGNOSIS — E878 Other disorders of electrolyte and fluid balance, not elsewhere classified: Secondary | ICD-10-CM | POA: Diagnosis present

## 2019-04-13 DIAGNOSIS — Z6841 Body Mass Index (BMI) 40.0 and over, adult: Secondary | ICD-10-CM | POA: Diagnosis not present

## 2019-04-13 DIAGNOSIS — I10 Essential (primary) hypertension: Secondary | ICD-10-CM | POA: Diagnosis present

## 2019-04-13 DIAGNOSIS — R55 Syncope and collapse: Secondary | ICD-10-CM

## 2019-04-13 DIAGNOSIS — R778 Other specified abnormalities of plasma proteins: Secondary | ICD-10-CM | POA: Diagnosis not present

## 2019-04-13 DIAGNOSIS — I272 Pulmonary hypertension, unspecified: Secondary | ICD-10-CM | POA: Diagnosis present

## 2019-04-13 LAB — CBC WITH DIFFERENTIAL/PLATELET
Abs Immature Granulocytes: 0.02 10*3/uL (ref 0.00–0.07)
Basophils Absolute: 0 10*3/uL (ref 0.0–0.1)
Basophils Relative: 0 %
Eosinophils Absolute: 0 10*3/uL (ref 0.0–0.5)
Eosinophils Relative: 0 %
HCT: 42.5 % (ref 39.0–52.0)
Hemoglobin: 14.8 g/dL (ref 13.0–17.0)
Immature Granulocytes: 0 %
Lymphocytes Relative: 19 %
Lymphs Abs: 1.8 10*3/uL (ref 0.7–4.0)
MCH: 29.2 pg (ref 26.0–34.0)
MCHC: 34.8 g/dL (ref 30.0–36.0)
MCV: 84 fL (ref 80.0–100.0)
Monocytes Absolute: 1.1 10*3/uL — ABNORMAL HIGH (ref 0.1–1.0)
Monocytes Relative: 12 %
Neutro Abs: 6.8 10*3/uL (ref 1.7–7.7)
Neutrophils Relative %: 69 %
Platelets: 332 10*3/uL (ref 150–400)
RBC: 5.06 MIL/uL (ref 4.22–5.81)
RDW: 11.8 % (ref 11.5–15.5)
WBC: 9.8 10*3/uL (ref 4.0–10.5)
nRBC: 0 % (ref 0.0–0.2)

## 2019-04-13 LAB — ECHOCARDIOGRAM COMPLETE
Height: 66 in
Weight: 4480 oz

## 2019-04-13 LAB — TROPONIN I (HIGH SENSITIVITY)
Troponin I (High Sensitivity): 20 ng/L — ABNORMAL HIGH (ref <18)
Troponin I (High Sensitivity): 23 ng/L — ABNORMAL HIGH (ref <18)

## 2019-04-13 LAB — COMPREHENSIVE METABOLIC PANEL
ALT: 24 U/L (ref 0–44)
AST: 34 U/L (ref 15–41)
Albumin: 3.5 g/dL (ref 3.5–5.0)
Alkaline Phosphatase: 90 U/L (ref 38–126)
Anion gap: 12 (ref 5–15)
BUN: 15 mg/dL (ref 6–20)
CO2: 24 mmol/L (ref 22–32)
Calcium: 8.2 mg/dL — ABNORMAL LOW (ref 8.9–10.3)
Chloride: 92 mmol/L — ABNORMAL LOW (ref 98–111)
Creatinine, Ser: 1.01 mg/dL (ref 0.61–1.24)
GFR calc Af Amer: >60 mL/min (ref >60)
GFR calc non Af Amer: >60 mL/min (ref >60)
Glucose, Bld: 171 mg/dL — ABNORMAL HIGH (ref 70–99)
Potassium: 3 mmol/L — ABNORMAL LOW (ref 3.5–5.1)
Sodium: 128 mmol/L — ABNORMAL LOW (ref 135–145)
Total Bilirubin: 0.6 mg/dL (ref 0.3–1.2)
Total Protein: 6.1 g/dL — ABNORMAL LOW (ref 6.5–8.1)

## 2019-04-13 LAB — BASIC METABOLIC PANEL
Anion gap: 7 (ref 5–15)
BUN: 15 mg/dL (ref 6–20)
CO2: 27 mmol/L (ref 22–32)
Calcium: 8.7 mg/dL — ABNORMAL LOW (ref 8.9–10.3)
Chloride: 93 mmol/L — ABNORMAL LOW (ref 98–111)
Creatinine, Ser: 0.92 mg/dL (ref 0.61–1.24)
GFR calc Af Amer: >60 mL/min (ref >60)
GFR calc non Af Amer: >60 mL/min (ref >60)
Glucose, Bld: 91 mg/dL (ref 70–99)
Potassium: 4.6 mmol/L (ref 3.5–5.1)
Sodium: 127 mmol/L — ABNORMAL LOW (ref 135–145)

## 2019-04-13 LAB — MAGNESIUM: Magnesium: 2 mg/dL (ref 1.7–2.4)

## 2019-04-13 LAB — CARBAMAZEPINE LEVEL, TOTAL: Carbamazepine Lvl: 7.1 ug/mL (ref 4.0–12.0)

## 2019-04-13 LAB — FIBRIN DERIVATIVES D-DIMER (ARMC ONLY): Fibrin derivatives D-dimer (ARMC): 401.58 ng/mL (FEU) (ref 0.00–499.00)

## 2019-04-13 LAB — RESPIRATORY PANEL BY RT PCR (FLU A&B, COVID)
Influenza A by PCR: NEGATIVE
Influenza B by PCR: NEGATIVE
SARS Coronavirus 2 by RT PCR: NEGATIVE

## 2019-04-13 LAB — TSH: TSH: 1.706 u[IU]/mL (ref 0.350–4.500)

## 2019-04-13 LAB — OSMOLALITY: Osmolality: 265 mOsm/kg — ABNORMAL LOW (ref 275–295)

## 2019-04-13 LAB — HIV ANTIBODY (ROUTINE TESTING W REFLEX): HIV Screen 4th Generation wRfx: NONREACTIVE

## 2019-04-13 MED ORDER — SODIUM CHLORIDE 0.9% FLUSH
3.0000 mL | Freq: Two times a day (BID) | INTRAVENOUS | Status: DC
  Administered 2019-04-13 – 2019-04-14 (×3): 3 mL via INTRAVENOUS

## 2019-04-13 MED ORDER — LOSARTAN POTASSIUM 50 MG PO TABS
100.0000 mg | ORAL_TABLET | Freq: Every day | ORAL | Status: DC
  Administered 2019-04-13 – 2019-04-14 (×2): 100 mg via ORAL
  Filled 2019-04-13 (×2): qty 2

## 2019-04-13 MED ORDER — POTASSIUM CHLORIDE 10 MEQ/100ML IV SOLN
10.0000 meq | Freq: Once | INTRAVENOUS | Status: AC
  Administered 2019-04-13: 05:00:00 10 meq via INTRAVENOUS
  Filled 2019-04-13: qty 100

## 2019-04-13 MED ORDER — OXYCODONE-ACETAMINOPHEN 5-325 MG PO TABS
1.0000 | ORAL_TABLET | Freq: Once | ORAL | Status: AC
  Administered 2019-04-13: 1 via ORAL
  Filled 2019-04-13: qty 1

## 2019-04-13 MED ORDER — FLUTICASONE PROPIONATE 50 MCG/ACT NA SUSP
2.0000 | Freq: Every day | NASAL | Status: DC
  Administered 2019-04-14: 2 via NASAL
  Filled 2019-04-13: qty 16

## 2019-04-13 MED ORDER — NEBIVOLOL HCL 10 MG PO TABS: 10.0000 mg | ORAL_TABLET | Freq: Every evening | ORAL | Status: DC

## 2019-04-13 MED ORDER — POTASSIUM CHLORIDE CRYS ER 20 MEQ PO TBCR
40.0000 meq | EXTENDED_RELEASE_TABLET | Freq: Once | ORAL | Status: AC
  Administered 2019-04-13: 05:00:00 40 meq via ORAL
  Filled 2019-04-13: qty 2

## 2019-04-13 MED ORDER — POTASSIUM CHLORIDE 20 MEQ PO PACK
40.0000 meq | PACK | Freq: Once | ORAL | Status: AC
  Administered 2019-04-13: 40 meq via ORAL
  Filled 2019-04-13: qty 2

## 2019-04-13 MED ORDER — MECLIZINE HCL 25 MG PO TABS
25.0000 mg | ORAL_TABLET | Freq: Three times a day (TID) | ORAL | Status: DC | PRN
  Filled 2019-04-13: qty 1

## 2019-04-13 MED ORDER — ONDANSETRON HCL 4 MG/2ML IJ SOLN
4.0000 mg | Freq: Four times a day (QID) | INTRAMUSCULAR | Status: DC | PRN
  Administered 2019-04-13: 4 mg via INTRAVENOUS
  Filled 2019-04-13: qty 2

## 2019-04-13 MED ORDER — HYDRALAZINE HCL 50 MG PO TABS
50.0000 mg | ORAL_TABLET | Freq: Three times a day (TID) | ORAL | Status: DC
  Administered 2019-04-13 – 2019-04-14 (×4): 50 mg via ORAL
  Filled 2019-04-13 (×5): qty 1

## 2019-04-13 MED ORDER — ENOXAPARIN SODIUM 40 MG/0.4ML ~~LOC~~ SOLN
40.0000 mg | Freq: Two times a day (BID) | SUBCUTANEOUS | Status: DC
  Administered 2019-04-13 – 2019-04-14 (×3): 40 mg via SUBCUTANEOUS
  Filled 2019-04-13 (×3): qty 0.4

## 2019-04-13 MED ORDER — CARBAMAZEPINE 200 MG PO TABS
200.0000 mg | ORAL_TABLET | Freq: Four times a day (QID) | ORAL | Status: DC
  Administered 2019-04-13 – 2019-04-14 (×6): 200 mg via ORAL
  Filled 2019-04-13 (×8): qty 1

## 2019-04-13 MED ORDER — ACETAMINOPHEN 325 MG PO TABS
650.0000 mg | ORAL_TABLET | Freq: Four times a day (QID) | ORAL | Status: DC | PRN
  Administered 2019-04-13: 650 mg via ORAL
  Filled 2019-04-13: qty 2

## 2019-04-13 MED ORDER — SODIUM CHLORIDE 0.9 % IV BOLUS
1000.0000 mL | Freq: Once | INTRAVENOUS | Status: AC
  Administered 2019-04-13: 05:00:00 1000 mL via INTRAVENOUS

## 2019-04-13 NOTE — ED Notes (Signed)
Pt given water to drink. 

## 2019-04-13 NOTE — Progress Notes (Signed)
[facility] CARE NOTE Patient: Douglas Newton [id number]   PCP: Maryland Pink, MD DOB: 10-Sep-1960   DOA: 04/13/2019   DOS: 04/13/2019    Patient was admitted by my colleague Dr. Sidney Ace earlier on 04/13/2019. I have reviewed the H&P as well as assessment and plan and agree with the same. Important changes in the plan are listed below.  Plan of care: Active Problems:   Symptomatic bradycardia Syncope.  W Cardiology consulted. We will follow recommendation. Echocardiogram performed currently pending. MRI brain negative for stroke. EEG negative for any acute seizures. Patient remains asymptomatic. Still hyponatremic.  Author: Berle Mull, MD [facility] 04/13/2019 6:57 PM   If 7PM-7AM, please contact night-coverage at [email or web address]

## 2019-04-13 NOTE — ED Notes (Signed)
PT remains in imaging

## 2019-04-13 NOTE — Progress Notes (Signed)
*  PRELIMINARY RESULTS* Echocardiogram 2D Echocardiogram has been performed.  Sherrie Sport 04/13/2019, 11:58 AM

## 2019-04-13 NOTE — Procedures (Signed)
ELECTROENCEPHALOGRAM REPORT   Patient: Douglas Newton       Room #: [id number] EEG No. ID: [id number] Age: 59 y.o.        Sex: male Requesting Physician: Posey Pronto Report Date:  04/13/2019        Interpreting Physician: Alexis Goodell  History: Douglas Newton is an 59 y.o. male with a history of seizures presenting after a syncopal event.  Medications:  Tegretol, Apresoline, Cozaar  Conditions of Recording:  This is a 21 channel routine scalp EEG performed with bipolar and monopolar montages arranged in accordance to the international 10/20 system of electrode placement. One channel was dedicated to EKG recording.  The patient is in the awake, drowsy and asleep states.  Description:  The waking background activity consists of a low voltage, symmetrical, fairly well organized, 8 Hz alpha activity, seen from the parieto-occipital and posterior temporal regions.  Low voltage fast activity, poorly organized, is seen anteriorly and is at times superimposed on more posterior regions.  A mixture of theta and alpha rhythms are seen from the central and temporal regions. The patient drowses with slowing to irregular, low voltage theta and beta activity.   The patient goes in to a light sleep with symmetrical sleep spindles, vertex central sharp transients and irregular slow activity.  No epileptiform activity is noted.   Hyperventilation was not performed.  Intermittent photic stimulation was performed but failed to illicit any abnormalities.  IMPRESSION: Normal electroencephalogram, awake, asleep and with activation procedures. There are no focal lateralizing or epileptiform features.   Alexis Goodell, MD Neurology [phone] 04/13/2019, 4:39 PM

## 2019-04-13 NOTE — ED Notes (Signed)
US at bedside

## 2019-04-13 NOTE — ED Notes (Signed)
Pt transported to CT ?

## 2019-04-13 NOTE — Progress Notes (Signed)
eeg completed ° °

## 2019-04-13 NOTE — ED Notes (Signed)
Attempted to draw morning labs, unsuccessful x 2. Called lab to draw morning labs.

## 2019-04-13 NOTE — Progress Notes (Signed)
PHARMACIST - PHYSICIAN COMMUNICATION  CONCERNING:  Enoxaparin (Lovenox) for DVT Prophylaxis    RECOMMENDATION: Patient was prescribed enoxaprin 40mg  q24 hours for VTE prophylaxis.   Filed Weights   04/13/19 0335  Weight: 280 lb (127 kg)    Body mass index is 45.19 kg/m.  Estimated Creatinine Clearance: 100.5 mL/min (by C-G formula based on SCr of 1.01 mg/dL).   Based on [place] patient is candidate for enoxaparin 40mg  every 12 hour dosing due to BMI being >40.  DESCRIPTION: Pharmacy has adjusted enoxaparin dose per [facility] policy.  Patient is now receiving enoxaparin 40mg  every 12 hours.    Hennessey Cantrell, PharmD Clinical Pharmacist  04/13/2019 7:12 AM

## 2019-04-13 NOTE — ED Notes (Signed)
PT in [place] at this time

## 2019-04-13 NOTE — ED Notes (Signed)
Admitting at bedside.   Pt talking on phone for MRI screening questions.

## 2019-04-13 NOTE — ED Provider Notes (Signed)
[facility] Emergency Department Provider Note  ____________________________________________  Time seen: Approximately 4:03 AM  I have reviewed the triage vital signs and the nursing notes.   HISTORY  Chief Complaint Fall and Loss of Consciousness   HPI JAQUARRIUS TRISTAN is a 59 y.o. male  with a history of obesity, hypertension, seizure disorder, peptic ulcer disease, syncope, GERD who presents for evaluation of syncope.  Patient reports being in his usual state of health when he went to bed.  He woke up to go to the bathroom.  As soon as he stood up he started feeling dizzy and passed out.  He woke up on the floor.  He lives alone and is not sure how long he was out for.  He denies feeling confused, tongue trauma, or urinary or bowel incontinence.  He knew exactly where he was and called 911.  When EMS arrived patient was ambulatory to the door to let them in.  They found a hole in the drywall where patient had fallen and hit his head.  He is complaining of 5 out of 10 neck pain and headache since the fall.  He is also complaining of still feeling lightheaded and with dry mouth.  He reports eating and drinking normally yesterday.  He denies chest pain or shortness of breath, abdominal pain or back pain, extremity pain, nausea, vomiting, diarrhea, fever, chills, dysuria, cough.   Per EMS, patient was complaining of shortness of breath and was placed on 2.5 L nasal cannula.  Patient denies any shortness of breath and does not have any oxygen requirement at this time.    Past Medical History:  Diagnosis Date   Arthritis 2011   Colon polyp 01/15/2012   Diarrhea    GERD (gastroesophageal reflux disease)    Hernia 2007   admits 2006   Hypertension admits 2011   Obesity, unspecified    Personal history of tobacco use, presenting hazards to health    Screening for obesity    Seizure disorder ([place]) 1965   admits [place]   admits [id number]    Umbilical hernia without mention of obstruction or gangrene    Ventral hernia, unspecified, without mention of obstruction or gangrene     Patient Active Problem List   Diagnosis Date Noted   Symptomatic bradycardia 04/13/2019   History of colonic polyps 10/08/2016   Syncope and collapse 04/19/2015   HTN (hypertension) 04/19/2015   Seizure disorder ([place]) 04/19/2015   GERD (gastroesophageal reflux disease) 04/19/2015   Leukocytosis 04/19/2015    Past Surgical History:  Procedure Laterality Date   ABDOMINAL SURGERY  1965   arm surgery  1969   COLONOSCOPY  01/15/2012   Dr Bary Castilla   COLONOSCOPY WITH PROPOFOL N/A 12/04/2016   Procedure: COLONOSCOPY WITH PROPOFOL;  Surgeon: Robert Bellow, MD;  Location: [facility] ENDOSCOPY;  Service: Endoscopy;  Laterality: N/A;   HERNIA REPAIR  December 2013   ventral hernia,umbilical hernia   KNEE ARTHROSCOPY  October 1983   right knee   KNEE SURGERY  October 2011   left knee   LOBECTOMY  May 1965   stomach tube  1965   wires removed from under arm  1963    Prior to Admission medications   Medication Sig Start Date End Date Taking? Authorizing Provider  carbamazepine (TEGRETOL) 200 MG tablet Take 200 mg by mouth 4 (four) times daily.   Yes [provider]  fluticasone (FLONASE) 50 MCG/ACT nasal spray Place 2  sprays into both nostrils daily.   Yes [provider]  hydrALAZINE (APRESOLINE) 50 MG tablet Take 50 mg by mouth 3 (three) times daily.   Yes [provider]  hydrochlorothiazide (HYDRODIURIL) 25 MG tablet Take 25 mg by mouth daily. 03/07/19  Yes [provider]  losartan (COZAAR) 100 MG tablet Take 100 mg by mouth daily.   Yes [provider]  nebivolol (BYSTOLIC) 10 MG tablet Take 10 mg by mouth every evening.    Yes [provider]  sildenafil (VIAGRA) 100 MG tablet Take 100 mg by mouth daily as needed for erectile dysfunction.   Yes [provider]     Allergies Patient has no known allergies.  Family History  Problem Relation Age of Onset   Heart attack Maternal Grandfather    Heart attack Maternal Grandmother    Heart attack Mother    Hypertension Paternal Grandfather    Hypertension Paternal Grandmother     Social History Social History   Tobacco Use   Smoking status: Former Smoker    Packs/day: 1.00    Years: 1.00    Pack years: 1.00   Smokeless tobacco: Never Used  Substance Use Topics   Alcohol use: Yes   Drug use: No    Review of Systems  Constitutional: Negative for fever. + syncope Eyes: Negative for visual changes. ENT: Negative for facial injury. + neck pain Cardiovascular: Negative for chest injury. Respiratory: Negative for shortness of breath. Negative for chest wall injury. Gastrointestinal: Negative for abdominal pain or injury. Genitourinary: Negative for dysuria. Musculoskeletal: Negative for back injury, negative for arm or leg pain. Skin: Negative for laceration/abrasions. Neurological: + head injury.   ____________________________________________   PHYSICAL EXAM:  VITAL SIGNS: ED Triage Vitals  Enc Vitals Group     BP 04/13/19 0334 (!) 131/53     Pulse Rate 04/13/19 0334 62     Resp 04/13/19 0334 12     Temp 04/13/19 0334 97.6 F (36.4 C)     Temp Source 04/13/19 0334 Oral     SpO2 04/13/19 0334 100 %     Weight 04/13/19 0335 280 lb (127 kg)     Height 04/13/19 0335 5\' 6"  (1.676 m)     Head Circumference --      Peak Flow --      Pain Score 04/13/19 0334 5     Pain Loc --      Pain Edu? --      Excl. in [place]? --     Full spinal precautions maintained throughout the trauma exam. Constitutional: Alert and oriented. No acute distress. Does not appear intoxicated. HEENT Head: Normocephalic and atraumatic. Small specks of dry wall on patient's hair. Face: No facial bony tenderness. Stable midface Ears: No hemotympanum bilaterally. No Battle sign Eyes: No eye injury.  PERRL. No raccoon eyes Nose: Nontender. No epistaxis. No rhinorrhea Mouth/Throat: Mucous membranes are moist. No oropharyngeal blood. No dental injury. Airway patent without stridor. Normal voice. Neck: no C-collar. No midline c-spine tenderness.  Cardiovascular: Normal rate, regular rhythm. Normal and symmetric distal pulses are present in all extremities. Pulmonary/Chest: Chest wall is stable and nontender to palpation/compression. Normal respiratory effort. Breath sounds are normal. No crepitus.  Abdominal: Soft, nontender, non distended. Musculoskeletal: Nontender with normal full range of motion in all extremities. No deformities. No thoracic or lumbar midline spinal tenderness. Pelvis is stable. Skin: Skin is warm, dry and intact. No abrasions or contutions. Psychiatric: Speech and behavior are appropriate. Neurological:  Normal speech and language. Moves all extremities to command. No gross focal neurologic deficits are appreciated.  Glascow Coma Score: 4 - Opens eyes on own 6 - Follows simple motor commands 5 - Alert and oriented GCS: 15   ____________________________________________   LABS (all labs ordered are listed, but only abnormal results are displayed)  Labs Reviewed  CBC WITH DIFFERENTIAL/PLATELET - Abnormal; Notable for the following components:      Result Value   Monocytes Absolute 1.1 (*)    All other components within normal limits  COMPREHENSIVE METABOLIC PANEL - Abnormal; Notable for the following components:   Sodium 128 (*)    Potassium 3.0 (*)    Chloride 92 (*)    Glucose, Bld 171 (*)    Calcium 8.2 (*)    Total Protein 6.1 (*)    All other components within normal limits  TROPONIN I (HIGH SENSITIVITY) - Abnormal; Notable for the following components:   Troponin I (High Sensitivity) 20 (*)    All other components within normal limits  RESPIRATORY PANEL BY RT PCR (FLU A&B, COVID)  MAGNESIUM  HIV ANTIBODY (ROUTINE TESTING W REFLEX)  TSH  NA AND K  (SODIUM & POTASSIUM), RAND UR  OSMOLALITY  OSMOLALITY, URINE  TROPONIN I (HIGH SENSITIVITY)   ____________________________________________  EKG  ED ECG REPORT I, Rudene Re, the attending physician, personally viewed and interpreted this ECG.  Normal sinus rhythm with rate of 70, normal intervals, normal axis, LVH with repolarization abnormalities which is unchanged from baseline.  04:45AM: Sinus bradycardia, rate of 39, normal intervals, normal axis, LVH. ____________________________________________  RADIOLOGY  I have personally reviewed the images performed during this visit and I agree with the Radiologist's read.   Interpretation by Radiologist:  DG Chest 1 View  Result Date: 04/13/2019 CLINICAL DATA:  Fall, trauma EXAM: CHEST  1 VIEW COMPARISON:  None. FINDINGS: Remote appearing right chest wall deformity with a healed fracture of the right sixth rib posteriorly. Adjacent surgical material likely reflecting change from prior thoracotomy and lobectomy. Additional postsurgical change about the hilum. No consolidation, features of edema, pneumothorax, or effusion. Mild cardiomegaly with a calcified aorta. No acute osseous or soft tissue abnormality. IMPRESSION: Postsurgical changes in the right chest wall and hilum likely related to prior thoracotomy and lobectomy. No acute cardiopulmonary abnormality. Mild cardiomegaly. Aortic Atherosclerosis (ICD10-I70.0). Electronically Signed   By: Lovena Le M.D.   On: 04/13/2019 04:43   CT Head Wo Contrast  Result Date: 04/13/2019 CLINICAL DATA:  Syncope, possible head strike EXAM: CT HEAD WITHOUT CONTRAST CT CERVICAL SPINE WITHOUT CONTRAST TECHNIQUE: Multidetector CT imaging of the head and cervical spine was performed following the standard protocol without intravenous contrast. Multiplanar CT image reconstructions of the cervical spine were also generated. COMPARISON:  None. FINDINGS: CT HEAD FINDINGS Brain: No evidence of acute  infarction, hemorrhage, hydrocephalus, extra-axial collection or mass lesion/mass effect. Vascular: No hyperdense vessel or unexpected calcification. Skull: Mild left temporal scalp thickening without large hematoma. No calvarial fracture or suspicious osseous lesion. Sinuses/Orbits: Minimal mural thickening in the maxillary sinuses. Remaining paranasal sinuses and mastoids are predominantly clear. Included orbital structures are unremarkable. Other: None CT CERVICAL SPINE FINDINGS Alignment: Slight reversal of the lower cervical lordosis with likely degenerative stepwise anterolisthesis C3-C6. No convincing traumatic listhesis in the absence of abnormally widened, perched or jumped facets. Craniocervical and atlantoaxial articulations are maintained. Skull base and vertebrae: No acute fracture. No primary bone lesion or focal pathologic process. Soft tissues and spinal canal: No pre  or paravertebral fluid or swelling. No visible canal hematoma. Disc levels: Multilevel intervertebral disc height loss with spondylitic endplate changes. Features most pronounced C5-6, C6-7 with at least mild canal stenosis at these levels. Uncinate spurring and facet hypertrophic changes are also new, mild-to-moderate at the aforementioned levels and moderate to severe on the left at C4-5. Upper chest: No acute abnormality in the upper chest or imaged lung apices. Other: Normal thyroid. IMPRESSION: 1. Mild left temporal scalp thickening without large hematoma. No calvarial fracture. 2. No evidence of acute intracranial findings. 3. No evidence of acute fracture or listhesis involving the cervical spine. 4. Multilevel spondylitic and facet degenerative changes in the cervical spine as detailed above. Electronically Signed   By: Lovena Le M.D.   On: 04/13/2019 04:15   CT Cervical Spine Wo Contrast  Result Date: 04/13/2019 CLINICAL DATA:  Syncope, possible head strike EXAM: CT HEAD WITHOUT CONTRAST CT CERVICAL SPINE WITHOUT  CONTRAST TECHNIQUE: Multidetector CT imaging of the head and cervical spine was performed following the standard protocol without intravenous contrast. Multiplanar CT image reconstructions of the cervical spine were also generated. COMPARISON:  None. FINDINGS: CT HEAD FINDINGS Brain: No evidence of acute infarction, hemorrhage, hydrocephalus, extra-axial collection or mass lesion/mass effect. Vascular: No hyperdense vessel or unexpected calcification. Skull: Mild left temporal scalp thickening without large hematoma. No calvarial fracture or suspicious osseous lesion. Sinuses/Orbits: Minimal mural thickening in the maxillary sinuses. Remaining paranasal sinuses and mastoids are predominantly clear. Included orbital structures are unremarkable. Other: None CT CERVICAL SPINE FINDINGS Alignment: Slight reversal of the lower cervical lordosis with likely degenerative stepwise anterolisthesis C3-C6. No convincing traumatic listhesis in the absence of abnormally widened, perched or jumped facets. Craniocervical and atlantoaxial articulations are maintained. Skull base and vertebrae: No acute fracture. No primary bone lesion or focal pathologic process. Soft tissues and spinal canal: No pre or paravertebral fluid or swelling. No visible canal hematoma. Disc levels: Multilevel intervertebral disc height loss with spondylitic endplate changes. Features most pronounced C5-6, C6-7 with at least mild canal stenosis at these levels. Uncinate spurring and facet hypertrophic changes are also new, mild-to-moderate at the aforementioned levels and moderate to severe on the left at C4-5. Upper chest: No acute abnormality in the upper chest or imaged lung apices. Other: Normal thyroid. IMPRESSION: 1. Mild left temporal scalp thickening without large hematoma. No calvarial fracture. 2. No evidence of acute intracranial findings. 3. No evidence of acute fracture or listhesis involving the cervical spine. 4. Multilevel spondylitic and  facet degenerative changes in the cervical spine as detailed above. Electronically Signed   By: Lovena Le M.D.   On: 04/13/2019 04:15      ____________________________________________   PROCEDURES  Procedure(s) performed: None Procedures Critical Care performed: yes  CRITICAL CARE Performed by: Rudene Re  ?  Total critical care time: 35 min  Critical care time was exclusive of separately billable procedures and treating other patients.  Critical care was necessary to treat or prevent imminent or life-threatening deterioration.  Critical care was time spent personally by me on the following activities: development of treatment plan with patient and/or surrogate as well as nursing, discussions with consultants, evaluation of patient's response to treatment, examination of patient, obtaining history from patient or surrogate, ordering and performing treatments and interventions, ordering and review of laboratory studies, ordering and review of radiographic studies, pulse oximetry and re-evaluation of patient's condition.  ____________________________________________   INITIAL IMPRESSION / ASSESSMENT AND PLAN / ED COURSE   59 y.o. male  with a history of obesity, hypertension, seizure disorder, peptic ulcer disease, syncope, GERD who presents for evaluation of syncope with fall and trauma.  Complaining of headache and neck pain.  Exam showing no obvious signs of trauma.  Patient was placed on telemetry due to syncope for possible concerns of cardiac dysrhythmia.  EKG was done showing no evidence of dysrhythmias or acute ischemic changes.  Patient does have changes of LVH which is unchanged from baseline.  Vitals with no fever, tachycardia, tachypnea, hypoxia, or hypotension.  We will send patient for CT head and cervical spine to rule out any acute traumatic injury.  Once that is done we will check orthostatic vital signs.  Will check labs for signs of dehydration, anemia, AKI,  electrolyte abnormalities.  Patient does look dry on exam, will give fluids.  Review of patient's medical records show that patient has a history of syncope and has been seen by cardiology with an echocardiogram and a Holter monitoring showing no cardiac etiology for his syncope.  No postictal phase less likely to have been a seizure.  Will monitor patient closely.   _________________________ 4:46 AM on 04/13/2019 -----------------------------------------  Patient noted to be bradycardic and hypotensive on bedside telemetry. Repeat EKG showing sinus bradycardia. Patient reports feeling dizzier and having SOB. Normal WOB and no hypoxia. CXR negative for PTX. Labs showing hyponatremia and mild hypokalemia. IVF will be given, K will be supplemented which could be contributing to patient's bradycardia. CT head and cspine with no abnormalities. Will admit patient to hospitalist. I have reviewed patient's previous medical records and PMH.       ____________________________________________  Please note:  Patient was evaluated in Emergency Department today for the symptoms described in the history of present illness. Patient was evaluated in the context of the global COVID-19 pandemic, which necessitated consideration that the patient might be at risk for infection with the SARS-CoV-2 virus that causes COVID-19. Institutional protocols and algorithms that pertain to the evaluation of patients at risk for COVID-19 are in a state of rapid change based on information released by regulatory bodies including the CDC and federal and state organizations. These policies and algorithms were followed during the patient's care in the ED.  Some ED evaluations and interventions may be delayed as a result of limited staffing during the pandemic.   ____________________________________________   FINAL CLINICAL IMPRESSION(S) / ED DIAGNOSES   Final diagnoses:  Hyponatremia  Syncope, unspecified syncope type  Sinus  bradycardia  Hypokalemia  Hypotension, unspecified hypotension type      NEW MEDICATIONS STARTED DURING THIS VISIT:  ED Discharge Orders    None       Note:  This document was prepared using Dragon voice recognition software and may include unintentional dictation errors.    Alfred Levins, [place], MD 04/13/19 [phone]

## 2019-04-13 NOTE — ED Notes (Signed)
Pt taken to MRI at this time

## 2019-04-13 NOTE — ED Notes (Signed)
Physical therapy at bedside

## 2019-04-13 NOTE — Consult Note (Signed)
CARDIOLOGY CONSULT NOTE               Patient ID: Douglas Newton MRN: [id number] DOB/AGE: 58-Aug-1962 58 y.o.  Admit date: 04/13/2019 Referring Physician Dr. Eugenie Norrie  Primary Physician Dr. Maryland Pink  Primary Cardiologist Dr. Bartholome Newton  Reason for Consultation Symptomatic bradycardia   HPI: Douglas Newton is a 59 year old male with a past medical history significant for left ventricular hypertrophy, seizure disorder, essential hypertension, hyperlipidemia, and recurrent syncopal episodes who presented to the ED on 04/13/19 following a syncopal event. He reports that he was in his normal state of health until last night when he woke up to use the restroom.  He apparently passed out and woke up on the floor with a hole through the drywall.  Of note, he reports poor oral intake yesterday.  Workup in the ED was significant for sodium of 128, potassium of 3.0, initial high sensitivity troponin of 20, COVID-19 negative, and ECG revealing sinus rhythm at a ventricular rate of 70bpm with severe LVH.  He was noted to have episodic bradycardia on telemetry and nebivolol is currently being held.  He denies chest pain, palpitations, shortness of breath, lower extremity swelling, orthopnea, or PND.   He is followed in outpatient cardiology by Dr. Bartholome Newton. Holter monitoring this year revealed predominately sinus rhythm and sinus bradycardia with a HR range of 52 to 97bpm with an average rate of 65bpm.  No evidence of significant pauses or AV dissociation. Echocardiogram stress test on 01/23/18 revealed normal resting and stress LV function with an EF estimated greater than 55% with severe LVH and no significant valvular abnormalities.   Review of systems complete and found to be negative unless listed above     Past Medical History:  Diagnosis Date  . Arthritis 2011  . Colon polyp 01/15/2012  . Diarrhea   . GERD (gastroesophageal reflux disease)   . Hernia 2007   admits 2006  .  Hypertension admits 2011  . Obesity, unspecified   . Personal history of tobacco use, presenting hazards to health   . Screening for obesity   . Seizure disorder (Douglas Newton) 1965   admits 1964  . Ulcer 1975   admits 1974  . Umbilical hernia without mention of obstruction or gangrene   . Ventral hernia, unspecified, without mention of obstruction or gangrene     Past Surgical History:  Procedure Laterality Date  . ABDOMINAL SURGERY  1965  . arm surgery  1969  . COLONOSCOPY  01/15/2012   Dr Bary Castilla  . COLONOSCOPY WITH PROPOFOL N/A 12/04/2016   Procedure: COLONOSCOPY WITH PROPOFOL;  Surgeon: Robert Bellow, MD;  Location: [facility] ENDOSCOPY;  Service: Endoscopy;  Laterality: N/A;  . HERNIA REPAIR  December 2013   ventral hernia,umbilical hernia  . KNEE ARTHROSCOPY  October 1983   right knee  . KNEE SURGERY  October 2011   left knee  . LOBECTOMY  May 1965  . stomach tube  1965  . wires removed from under arm  1963    (Not in a hospital admission)  Social History   Socioeconomic History  . Marital status: Widowed    Spouse name: Not on file  . Number of children: Not on file  . Years of education: Not on file  . Highest education level: Not on file  Occupational History  . Not on file  Tobacco Use  . Smoking status: Former Smoker    Packs/day: 1.00    Years: 1.00  Pack years: 1.00  . Smokeless tobacco: Never Used  Substance and Sexual Activity  . Alcohol use: Yes  . Drug use: No  . Sexual activity: Not on file  Other Topics Concern  . Not on file  Social History Narrative   ** Merged History Encounter **       Social Determinants of Health   Financial Resource Strain:   . Difficulty of Paying Living Expenses:   Food Insecurity:   . Worried About Charity fundraiser in the Last Year:   . Arboriculturist in the Last Year:   Transportation Needs:   . Film/video editor (Medical):   Marland Kitchen Lack of Transportation (Non-Medical):   Physical Activity:   . Days of  Exercise per Week:   . Minutes of Exercise per Session:   Stress:   . Feeling of Stress :   Social Connections:   . Frequency of Communication with Friends and Family:   . Frequency of Social Gatherings with Friends and Family:   . Attends Religious Services:   . Active Member of Clubs or Organizations:   . Attends Archivist Meetings:   Marland Kitchen Marital Status:   Intimate Partner Violence:   . Fear of Current or Ex-Partner:   . Emotionally Abused:   Marland Kitchen Physically Abused:   . Sexually Abused:     Family History  Problem Relation Age of Onset  . Heart attack Maternal Grandfather   . Heart attack Maternal Grandmother   . Heart attack Mother   . Hypertension Paternal Grandfather   . Hypertension Paternal Grandmother       Review of systems complete and found to be negative unless listed above     PHYSICAL EXAM  General: Well developed, obese, in no acute distress HEENT:  Normocephalic and atramatic Neck:  No JVD.  Lungs: Clear bilaterally to auscultation and percussion. Heart: HRRR . Normal S1 and S2 without gallops or murmurs.  Abdomen: Bowel sounds are positive, abdomen soft and non-tender  Msk:  Back normal, normal gait. Normal strength and tone for age. Extremities: No clubbing, cyanosis or edema.   Neuro: Alert and oriented X 3. Psych:  Good affect, responds appropriately  Labs:   Lab Results  Component Value Date   WBC 9.8 04/13/2019   HGB 14.8 04/13/2019   HCT 42.5 04/13/2019   MCV 84.0 04/13/2019   PLT 332 04/13/2019    Recent Labs  Lab 04/13/19 0324  NA 128*  K 3.0*  CL 92*  CO2 24  BUN 15  CREATININE 1.01  CALCIUM 8.2*  PROT 6.1*  BILITOT 0.6  ALKPHOS 90  ALT 24  AST 34  GLUCOSE 171*   Lab Results  Component Value Date   TROPONINI <0.03 04/20/2015   No results found for: CHOL No results found for: HDL No results found for: LDLCALC No results found for: TRIG No results found for: CHOLHDL No results found for: LDLDIRECT      Radiology: DG Chest 1 View  Result Date: 04/13/2019 CLINICAL DATA:  Fall, trauma EXAM: CHEST  1 VIEW COMPARISON:  None. FINDINGS: Remote appearing right chest wall deformity with a healed fracture of the right sixth rib posteriorly. Adjacent surgical material likely reflecting change from prior thoracotomy and lobectomy. Additional postsurgical change about the hilum. No consolidation, features of edema, pneumothorax, or effusion. Mild cardiomegaly with a calcified aorta. No acute osseous or soft tissue abnormality. IMPRESSION: Postsurgical changes in the right chest wall and hilum likely  related to prior thoracotomy and lobectomy. No acute cardiopulmonary abnormality. Mild cardiomegaly. Aortic Atherosclerosis (ICD10-I70.0). Electronically Signed   By: Lovena Le M.D.   On: 04/13/2019 04:43   CT Head Wo Contrast  Result Date: 04/13/2019 CLINICAL DATA:  Syncope, possible head strike EXAM: CT HEAD WITHOUT CONTRAST CT CERVICAL SPINE WITHOUT CONTRAST TECHNIQUE: Multidetector CT imaging of the head and cervical spine was performed following the standard protocol without intravenous contrast. Multiplanar CT image reconstructions of the cervical spine were also generated. COMPARISON:  None. FINDINGS: CT HEAD FINDINGS Brain: No evidence of acute infarction, hemorrhage, hydrocephalus, extra-axial collection or mass lesion/mass effect. Vascular: No hyperdense vessel or unexpected calcification. Skull: Mild left temporal scalp thickening without large hematoma. No calvarial fracture or suspicious osseous lesion. Sinuses/Orbits: Minimal mural thickening in the maxillary sinuses. Remaining paranasal sinuses and mastoids are predominantly clear. Included orbital structures are unremarkable. Other: None CT CERVICAL SPINE FINDINGS Alignment: Slight reversal of the lower cervical lordosis with likely degenerative stepwise anterolisthesis C3-C6. No convincing traumatic listhesis in the absence of abnormally widened, perched  or jumped facets. Craniocervical and atlantoaxial articulations are maintained. Skull base and vertebrae: No acute fracture. No primary bone lesion or focal pathologic process. Soft tissues and spinal canal: No pre or paravertebral fluid or swelling. No visible canal hematoma. Disc levels: Multilevel intervertebral disc height loss with spondylitic endplate changes. Features most pronounced C5-6, C6-7 with at least mild canal stenosis at these levels. Uncinate spurring and facet hypertrophic changes are also new, mild-to-moderate at the aforementioned levels and moderate to severe on the left at C4-5. Upper chest: No acute abnormality in the upper chest or imaged lung apices. Other: Normal thyroid. IMPRESSION: 1. Mild left temporal scalp thickening without large hematoma. No calvarial fracture. 2. No evidence of acute intracranial findings. 3. No evidence of acute fracture or listhesis involving the cervical spine. 4. Multilevel spondylitic and facet degenerative changes in the cervical spine as detailed above. Electronically Signed   By: Lovena Le M.D.   On: 04/13/2019 04:15   CT Cervical Spine Wo Contrast  Result Date: 04/13/2019 CLINICAL DATA:  Syncope, possible head strike EXAM: CT HEAD WITHOUT CONTRAST CT CERVICAL SPINE WITHOUT CONTRAST TECHNIQUE: Multidetector CT imaging of the head and cervical spine was performed following the standard protocol without intravenous contrast. Multiplanar CT image reconstructions of the cervical spine were also generated. COMPARISON:  None. FINDINGS: CT HEAD FINDINGS Brain: No evidence of acute infarction, hemorrhage, hydrocephalus, extra-axial collection or mass lesion/mass effect. Vascular: No hyperdense vessel or unexpected calcification. Skull: Mild left temporal scalp thickening without large hematoma. No calvarial fracture or suspicious osseous lesion. Sinuses/Orbits: Minimal mural thickening in the maxillary sinuses. Remaining paranasal sinuses and mastoids are  predominantly clear. Included orbital structures are unremarkable. Other: None CT CERVICAL SPINE FINDINGS Alignment: Slight reversal of the lower cervical lordosis with likely degenerative stepwise anterolisthesis C3-C6. No convincing traumatic listhesis in the absence of abnormally widened, perched or jumped facets. Craniocervical and atlantoaxial articulations are maintained. Skull base and vertebrae: No acute fracture. No primary bone lesion or focal pathologic process. Soft tissues and spinal canal: No pre or paravertebral fluid or swelling. No visible canal hematoma. Disc levels: Multilevel intervertebral disc height loss with spondylitic endplate changes. Features most pronounced C5-6, C6-7 with at least mild canal stenosis at these levels. Uncinate spurring and facet hypertrophic changes are also new, mild-to-moderate at the aforementioned levels and moderate to severe on the left at C4-5. Upper chest: No acute abnormality in the upper chest or  imaged lung apices. Other: Normal thyroid. IMPRESSION: 1. Mild left temporal scalp thickening without large hematoma. No calvarial fracture. 2. No evidence of acute intracranial findings. 3. No evidence of acute fracture or listhesis involving the cervical spine. 4. Multilevel spondylitic and facet degenerative changes in the cervical spine as detailed above. Electronically Signed   By: Lovena Le M.D.   On: 04/13/2019 04:15    EKG: Sinus rhythm, ventricular rate of 70bpm with LVH   ASSESSMENT AND PLAN:  1.  Syncope  -Noted to be bradycardic, hypokalemic, and hyponatremic   -Nevibilol currently held; rate currently in the 70s    -Carotid US, brain MRI, and echocardiogram pending; further plan pending results   2.  LVH   -Echocardiogram pending  3.  Elevated troponin   -Initial troponin 20, repeat troponin pending   4.  Hypokalemia, hyponatremia   -IV fluids, supplemental potassium given     The history, physical exam findings, and plan of care  were all discussed with Dr. Bartholome Newton, and all decision making was made in collaboration.   Signed: Avie Arenas PA-C 04/13/2019, 7:32 AM

## 2019-04-13 NOTE — H&P (Addendum)
[facility] [facility] [place] NAME: Jyron Stuewe    MR#:  [id number]  DATE OF BIRTH:  25-May-1960  DATE OF ADMISSION:  04/13/2019  PRIMARY CARE PHYSICIAN: Maryland Pink, MD   REQUESTING/REFERRING PHYSICIAN: Gonzella Lex, MD CHIEF COMPLAINT:   Chief Complaint  Patient presents with  . Fall  . Loss of Consciousness    HISTORY OF PRESENT ILLNESS:  Jarett Hammack  is a 59 y.o. African-American male with a known history of hypertension, seizure disorder, GERD and obesity, who presented to the emergency room with acute onset of syncope while going to the bathroom this morning.  He felt lightheaded before falling and making a hole in his drywall.  The patient denies any paresthesias or focal muscle weakness.  He denies any chest pain or palpitations or dyspnea.  No cough or wheezing or hemoptysis.  He denies any leg pain or worsening edema or recent travels or surgeries.  No diarrhea or melena or bright red bleeding per rectum.  No other bleeding diathesis.  He denies any tinnitus or vertigo.Marland Kitchen  Upon presentation the emergency room, blood pressure was 131/53 heart rate 62 and later on heart rate was down to 43 with a blood pressure of 83/46 with otherwise normal vital signs.  Labs revealed hyponatremia with a sodium 128 and hypokalemia with potassium of 3 hypochloremia with chloride of 92.  Blood glucose was 171.  CBC was unremarkable.  High-sensitivity troponin I was 20.   EKG initially showed sinus rhythm with rate of 70 with biatrial enlargement, LVH ST segment depression and T wave inversion laterally and inferiorly.  Repeat EKG showed sinus bradycardia with a rate of 39 with probable LVH with secondary repolarization abnormality.  It showed similar ST segment depression in 2 November laterally and inferiorly.  Chest ray showed postsurgical changes in the right chest and hilum likely related to prior thoracotomy and lobectomy with mild cardiomegaly, aortic  atherosclerosis with no acute cardiopulmonary abnormality.  The patient was given 1 p.o. Percocet, 10 equivalent of IV potassium chloride and 40 mEq p.o. potassium chloride as well as 1 L of IV normal saline bolus.  She will be admitted to a telemetry bed for further evaluation and management. PAST MEDICAL HISTORY:   Past Medical History:  Diagnosis Date  . Arthritis 2011  . Colon polyp 01/15/2012  . Diarrhea   . GERD (gastroesophageal reflux disease)   . Hernia 2007   admits 2006  . Hypertension admits 2011  . Obesity, unspecified   . Personal history of tobacco use, presenting hazards to health   . Screening for obesity   . Seizure disorder ([place]) 1965   admits 1964  . Ulcer 1975   admits 1974  . Umbilical hernia without mention of obstruction or gangrene   . Ventral hernia, unspecified, without mention of obstruction or gangrene     PAST SURGICAL HISTORY:   Past Surgical History:  Procedure Laterality Date  . ABDOMINAL SURGERY  1965  . arm surgery  1969  . COLONOSCOPY  01/15/2012   Dr Bary Castilla  . COLONOSCOPY WITH PROPOFOL N/A 12/04/2016   Procedure: COLONOSCOPY WITH PROPOFOL;  Surgeon: Robert Bellow, MD;  Location: [facility] ENDOSCOPY;  Service: Endoscopy;  Laterality: N/A;  . HERNIA REPAIR  December 2013   ventral hernia,umbilical hernia  . KNEE ARTHROSCOPY  October 1983   right knee  . KNEE SURGERY  October 2011   left knee  . LOBECTOMY  May 1965  . stomach tube  1965  . wires removed from under arm  1963    SOCIAL HISTORY:   Social History   Tobacco Use  . Smoking status: Former Smoker    Packs/day: 1.00    Years: 1.00    Pack years: 1.00  . Smokeless tobacco: Never Used  Substance Use Topics  . Alcohol use: Yes    FAMILY HISTORY:   Family History  Problem Relation Age of Onset  . Heart attack Maternal Grandfather   . Heart attack Maternal Grandmother   . Heart attack Mother   . Hypertension Paternal Grandfather   . Hypertension Paternal  Grandmother     DRUG ALLERGIES:  No Known Allergies  REVIEW OF SYSTEMS:   ROS As per history of present illness. All pertinent systems were reviewed above. Constitutional,  HEENT, cardiovascular, respiratory, GI, GU, musculoskeletal, neuro, psychiatric, endocrine,  integumentary and hematologic systems were reviewed and are otherwise  negative/unremarkable except for positive findings mentioned above in the HPI.   MEDICATIONS AT HOME:   Prior to Admission medications   Medication Sig Start Date End Date Taking? Authorizing Provider  carbamazepine (TEGRETOL) 200 MG tablet Take 200 mg by mouth 4 (four) times daily.   Yes [provider]  fluticasone (FLONASE) 50 MCG/ACT nasal spray Place 2 sprays into both nostrils daily.   Yes [provider]  hydrALAZINE (APRESOLINE) 50 MG tablet Take 50 mg by mouth 3 (three) times daily.   Yes [provider]  hydrochlorothiazide (HYDRODIURIL) 25 MG tablet Take 25 mg by mouth daily. 03/07/19  Yes [provider]  losartan (COZAAR) 100 MG tablet Take 100 mg by mouth daily.   Yes [provider]  nebivolol (BYSTOLIC) 10 MG tablet Take 10 mg by mouth every evening.    Yes [provider]  sildenafil (VIAGRA) 100 MG tablet Take 100 mg by mouth daily as needed for erectile dysfunction.   Yes [provider]      VITAL SIGNS:  Blood pressure (!) 136/57, pulse 63, temperature 97.6 F (36.4 C), temperature source Oral, resp. rate 14, height 5\' 6"  (1.676 m), weight 127 kg, SpO2 100 %.  PHYSICAL EXAMINATION:  Physical Exam  GENERAL:  59 y.o.-year-old African-American male patient lying in the bed with no acute distress.  EYES: Pupils equal, round, reactive to light and accommodation. No scleral icterus. Extraocular muscles intact.  HEENT: Head atraumatic, normocephalic. Oropharynx and nasopharynx clear.  NECK:  Supple, no jugular venous distention. No thyroid enlargement, no tenderness.    LUNGS: Normal breath sounds bilaterally, no wheezing, rales,rhonchi or crepitation. No use of accessory muscles of respiration.  CARDIOVASCULAR: Regular rate and rhythm, S1, S2 normal. No murmurs, rubs, or gallops.  ABDOMEN: Soft, nondistended, nontender. Bowel sounds present. No organomegaly or mass.  EXTREMITIES: No pedal edema, cyanosis, or clubbing.  NEUROLOGIC: Cranial nerves II through XII are intact. Muscle strength 5/5 in all extremities. Sensation intact. Gait not checked.  PSYCHIATRIC: The patient is alert and oriented x 3.  Normal affect and good eye contact. SKIN: No obvious rash, lesion, or ulcer.   LABORATORY PANEL:   CBC Recent Labs  Lab 04/13/19 0324  WBC 9.8  HGB 14.8  HCT 42.5  PLT 332   ------------------------------------------------------------------------------------------------------------------  Chemistries  Recent Labs  Lab 04/13/19 0324  NA 128*  K 3.0*  CL 92*  CO2 24  GLUCOSE 171*  BUN 15  CREATININE 1.01  CALCIUM 8.2*  MG 2.0  AST 34  ALT 24  ALKPHOS 90  BILITOT  0.6   ------------------------------------------------------------------------------------------------------------------  Cardiac Enzymes No results for input(s): TROPONINI in the last 168 hours. ------------------------------------------------------------------------------------------------------------------  RADIOLOGY:  DG Chest 1 View  Result Date: 04/13/2019 CLINICAL DATA:  Fall, trauma EXAM: CHEST  1 VIEW COMPARISON:  None. FINDINGS: Remote appearing right chest wall deformity with a healed fracture of the right sixth rib posteriorly. Adjacent surgical material likely reflecting change from prior thoracotomy and lobectomy. Additional postsurgical change about the hilum. No consolidation, features of edema, pneumothorax, or effusion. Mild cardiomegaly with a calcified aorta. No acute osseous or soft tissue abnormality. IMPRESSION: Postsurgical changes in the right chest wall  and hilum likely related to prior thoracotomy and lobectomy. No acute cardiopulmonary abnormality. Mild cardiomegaly. Aortic Atherosclerosis (ICD10-I70.0). Electronically Signed   By: Lovena Le M.D.   On: 04/13/2019 04:43   CT Head Wo Contrast  Result Date: 04/13/2019 CLINICAL DATA:  Syncope, possible head strike EXAM: CT HEAD WITHOUT CONTRAST CT CERVICAL SPINE WITHOUT CONTRAST TECHNIQUE: Multidetector CT imaging of the head and cervical spine was performed following the standard protocol without intravenous contrast. Multiplanar CT image reconstructions of the cervical spine were also generated. COMPARISON:  None. FINDINGS: CT HEAD FINDINGS Brain: No evidence of acute infarction, hemorrhage, hydrocephalus, extra-axial collection or mass lesion/mass effect. Vascular: No hyperdense vessel or unexpected calcification. Skull: Mild left temporal scalp thickening without large hematoma. No calvarial fracture or suspicious osseous lesion. Sinuses/Orbits: Minimal mural thickening in the maxillary sinuses. Remaining paranasal sinuses and mastoids are predominantly clear. Included orbital structures are unremarkable. Other: None CT CERVICAL SPINE FINDINGS Alignment: Slight reversal of the lower cervical lordosis with likely degenerative stepwise anterolisthesis C3-C6. No convincing traumatic listhesis in the absence of abnormally widened, perched or jumped facets. Craniocervical and atlantoaxial articulations are maintained. Skull base and vertebrae: No acute fracture. No primary bone lesion or focal pathologic process. Soft tissues and spinal canal: No pre or paravertebral fluid or swelling. No visible canal hematoma. Disc levels: Multilevel intervertebral disc height loss with spondylitic endplate changes. Features most pronounced C5-6, C6-7 with at least mild canal stenosis at these levels. Uncinate spurring and facet hypertrophic changes are also new, mild-to-moderate at the aforementioned levels and moderate to  severe on the left at C4-5. Upper chest: No acute abnormality in the upper chest or imaged lung apices. Other: Normal thyroid. IMPRESSION: 1. Mild left temporal scalp thickening without large hematoma. No calvarial fracture. 2. No evidence of acute intracranial findings. 3. No evidence of acute fracture or listhesis involving the cervical spine. 4. Multilevel spondylitic and facet degenerative changes in the cervical spine as detailed above. Electronically Signed   By: Lovena Le M.D.   On: 04/13/2019 04:15   CT Cervical Spine Wo Contrast  Result Date: 04/13/2019 CLINICAL DATA:  Syncope, possible head strike EXAM: CT HEAD WITHOUT CONTRAST CT CERVICAL SPINE WITHOUT CONTRAST TECHNIQUE: Multidetector CT imaging of the head and cervical spine was performed following the standard protocol without intravenous contrast. Multiplanar CT image reconstructions of the cervical spine were also generated. COMPARISON:  None. FINDINGS: CT HEAD FINDINGS Brain: No evidence of acute infarction, hemorrhage, hydrocephalus, extra-axial collection or mass lesion/mass effect. Vascular: No hyperdense vessel or unexpected calcification. Skull: Mild left temporal scalp thickening without large hematoma. No calvarial fracture or suspicious osseous lesion. Sinuses/Orbits: Minimal mural thickening in the maxillary sinuses. Remaining paranasal sinuses and mastoids are predominantly clear. Included orbital structures are unremarkable. Other: None CT CERVICAL SPINE FINDINGS Alignment: Slight reversal of the lower cervical lordosis with likely degenerative stepwise anterolisthesis C3-C6. No  convincing traumatic listhesis in the absence of abnormally widened, perched or jumped facets. Craniocervical and atlantoaxial articulations are maintained. Skull base and vertebrae: No acute fracture. No primary bone lesion or focal pathologic process. Soft tissues and spinal canal: No pre or paravertebral fluid or swelling. No visible canal hematoma. Disc  levels: Multilevel intervertebral disc height loss with spondylitic endplate changes. Features most pronounced C5-6, C6-7 with at least mild canal stenosis at these levels. Uncinate spurring and facet hypertrophic changes are also new, mild-to-moderate at the aforementioned levels and moderate to severe on the left at C4-5. Upper chest: No acute abnormality in the upper chest or imaged lung apices. Other: Normal thyroid. IMPRESSION: 1. Mild left temporal scalp thickening without large hematoma. No calvarial fracture. 2. No evidence of acute intracranial findings. 3. No evidence of acute fracture or listhesis involving the cervical spine. 4. Multilevel spondylitic and facet degenerative changes in the cervical spine as detailed above. Electronically Signed   By: Lovena Le M.D.   On: 04/13/2019 04:15      IMPRESSION AND PLAN:   1.  Syncope. -This is likely secondary to symptomatic bradycardia.  Differential diagnosis would include neurally mediated syncope as well as other arrhythmia related, cardiogenic and less likely hypoglycemia. -The patient will have a 2D echo and bilateral carotid Doppler. -We will check D-dimer. -Cardiology consult will be obtained. -I notified Dr. Ubaldo Glassing about the patient.  2.  Elevated troponin I. -We will follow serial troponin I's. -2D echo and a cardiology consult will be obtained as mentioned above.  3.  Hypokalemia. -Potassium will be placed in magnesium level will be checked.  4.  Hyponatremia. -This likely hypovolemic. -The patient will be hydrated with IV normal saline. -Hyponatremia work-up will be obtained. -We will hold HCTZ  5.  Hypertension. -We will continue Cozaar, Bystolic and hydralazine.  6.  Pulmonary hypertension. -Revatio will to be continued.  7.  DVT prophylaxis. -Subcutaneous  Lovenox   All the records are reviewed and case discussed with ED provider. The plan of care was discussed in details with the patient (and family). I  answered all questions. The patient agreed to proceed with the above mentioned plan. Further management will depend upon hospital course.   CODE STATUS: Full code  TOTAL TIME TAKING CARE OF THIS PATIENT: 55 minutes.    Christel Mormon M.D on 04/13/2019 at 6:07 AM  [facility]   From 7 PM-7 AM, contact night-coverage [email or web address]  CC: Primary care physician; Maryland Pink, MD   Note: This dictation was prepared with Dragon dictation along with smaller phrase technology. Any transcriptional errors that result from this process are unintentional.

## 2019-04-13 NOTE — ED Notes (Signed)
Pt provided breakfast tray. Denies wanting to eat anything. Provided ginger ale. Pt reminded to drink oral potassium drink.

## 2019-04-13 NOTE — ED Notes (Signed)
Lab at bedside

## 2019-04-13 NOTE — ED Triage Notes (Addendum)
Pt arrives from home via [facility].  He reports that he was feeling fine in bed, stood up to go to the bathroom and felt dizzy, woke up on the floor.  EMS reports a hole in the drywall and there is drywall noted in his hair.  Pt called EMS and was ambulatory to the door to let them in.  Hx of seizure-like activity but he states he has not had one in 30 years.  Pt reports that the feeling when he first woke up felt similar to when he had seizures 30 years ago. EMS reports increasing SOB and nausea.  Pt was dry heaving when he arrived at the ED room, he was on 2.5L [place].  Pt states he does not use O2 at home.  Pt states his neck hurts, rates the pain 5/10, and he is light-headed.

## 2019-04-13 NOTE — Progress Notes (Signed)
   04/13/19 1136  Therapy Vitals  Patient Position (if appropriate) Orthostatic Vitals  Orthostatic Lying   BP- Lying 137/67  Pulse- Lying 66  Orthostatic Sitting  BP- Sitting 143/72  Pulse- Sitting 66  Orthostatic Standing at 0 minutes  BP- Standing at 0 minutes 138/72  Pulse- Standing at 0 minutes 68  Orthostatic Standing at 3 minutes  BP- Standing at 3 minutes 156/75  Pulse- Standing at 3 minutes 76  Oxygen Therapy  O2 Device Room Air   Orthostatic vitals taken during PT evaluation, pt has no dizziness. 3-minute values taken after continuous walking/standing in room.   RN made aware.   11:39 AM, 04/13/19 Etta Grandchild, PT, DPT Physical Therapist - [facility]  [phone] [facility])

## 2019-04-13 NOTE — Evaluation (Signed)
Physical Therapy Evaluation Patient Details Name: Douglas Newton MRN: [id number] DOB: February 03, 1960 Today's Date: 04/13/2019   History of Present Illness  Miller Grimmett is a 59yoM who comes to [facility] after sustaining a syncopal episode at home and fall while attempting a trip to [place] in middle of night. PMH HTN, bilat knee OA.  Clinical Impression  Pt admitted with above diagnosis. Pt currently with functional limitations due to the deficits listed below (see "PT Problem List"). Upon entry, pt in bed, awake and agreeable to participate. Pt concerned initially due to significant nausea and dizziness this morning, but these are noted to be absent upon commencement of mobility. The pt is alert and oriented x4, pleasant, conversational, and generally a good historian. Pt endorses baseline with occasional dizziness with transition to standing too quickly. Also reports his recent fluid intake was not typical, as he drinks from a dedicated bottle at work, but at home is less consistent with intake. Orthostatic vitals are WNL, pt has no symptoms. BP is WNL after 3 minute standing activity. HR remains in 60s-70s in session, pt reports this is consistent with his rates at home when checking his BP. Pt reports to feel safe for independent mobility, but still not 100% back to baseline. Patient is at near baseline, all education completed, and time is given to address all questions/concerns. No additional skilled PT services needed at this time, PT signing off. PT recommends daily ambulation ad lib or with nursing staff as needed to prevent deconditioning.      Follow Up Recommendations No PT follow up    Equipment Recommendations  None recommended by PT    Recommendations for Other Services       Precautions / Restrictions Precautions Precautions: None Restrictions Weight Bearing Restrictions: No      Mobility  Bed Mobility Overal bed mobility: Needs Assistance Bed Mobility: Supine to Sit      Supine to sit: Min assist     General bed mobility comments: back hurting, somewhat weak.  Transfers Overall transfer level: Needs assistance Equipment used: None Transfers: Sit to/from Stand Sit to Stand: Supervision;From elevated surface            Ambulation/Gait Ambulation/Gait assistance: Modified independent (Device/Increase time) Gait Distance (Feet): 50 Feet Assistive device: None Gait Pattern/deviations: WFL(Within Functional Limits)     General Gait Details: reports to feel much better, pain improved, denies dizziness. Normal BP progression after 3 minutes standing/AMB.  Stairs            Wheelchair Mobility    Modified Rankin (Stroke Patients Only)       Balance Overall balance assessment: Independent                                           Pertinent Vitals/Pain Pain Assessment: Faces Faces Pain Scale: Hurts even more Pain Location: acute back pain sitting up in bed, suspect related to fall PTA Pain Descriptors / Indicators: Aching Pain Intervention(s): Limited activity within patient's tolerance;Monitored during session;Repositioned    Home Living Family/patient expects to be discharged to:: Private residence Living Arrangements: Alone(3 years widowed) Available Help at Discharge: Available PRN/intermittently;Family(Son lives in [place]) Type of Home: House         Home Equipment: None      Prior Function Level of Independence: Independent         Comments: works for [employer] in  [place], no baseline mobility limitations or impairment.     Hand Dominance        Extremity/Trunk Assessment   Upper Extremity Assessment Upper Extremity Assessment: Overall WFL for tasks assessed    Lower Extremity Assessment Lower Extremity Assessment: Overall WFL for tasks assessed    Cervical / Trunk Assessment Cervical / Trunk Assessment: Normal  Communication   Communication: No difficulties  Cognition Arousal/Alertness:  Awake/alert Behavior During Therapy: WFL for tasks assessed/performed Overall Cognitive Status: Within Functional Limits for tasks assessed                                        General Comments      Exercises     Assessment/Plan    PT Assessment Patent does not need any further PT services  PT Problem List         PT Treatment Interventions      PT Goals (Current goals can be found in the Care Plan section)  Acute Rehab PT Goals PT Goal Formulation: All assessment and education complete, DC therapy    Frequency     Barriers to discharge        Co-evaluation               AM-PAC PT "6 Clicks" Mobility  Outcome Measure Help needed turning from your back to your side while in a flat bed without using bedrails?: None Help needed moving from lying on your back to sitting on the side of a flat bed without using bedrails?: A Little Help needed moving to and from a bed to a chair (including a wheelchair)?: None Help needed standing up from a chair using your arms (e.g., wheelchair or bedside chair)?: None Help needed to walk in hospital room?: None Help needed climbing 3-5 steps with a railing? : A Little 6 Click Score: 22    End of Session   Activity Tolerance: Patient tolerated treatment well;No increased pain Patient left: in chair;with nursing/sitter in room Nurse Communication: Mobility status PT Visit Diagnosis: Unsteadiness on feet (R26.81);History of falling (Z91.81)    Time: [id number] PT Time Calculation (min) (ACUTE ONLY): 26 min   Charges:   PT Evaluation $PT Eval Low Complexity: 1 Low PT Treatments $Therapeutic Exercise: 8-22 mins        2:11 PM, 04/13/19 Etta Grandchild, PT, DPT Physical Therapist - [facility]  [phone] ([place])    Marenda Accardi C 04/13/2019, 2:07 PM

## 2019-04-14 LAB — BASIC METABOLIC PANEL
Anion gap: 7 (ref 5–15)
BUN: 15 mg/dL (ref 6–20)
CO2: 30 mmol/L (ref 22–32)
Calcium: 8.5 mg/dL — ABNORMAL LOW (ref 8.9–10.3)
Chloride: 93 mmol/L — ABNORMAL LOW (ref 98–111)
Creatinine, Ser: 0.88 mg/dL (ref 0.61–1.24)
GFR calc Af Amer: >60 mL/min (ref >60)
GFR calc non Af Amer: >60 mL/min (ref >60)
Glucose, Bld: 107 mg/dL — ABNORMAL HIGH (ref 70–99)
Potassium: 3.6 mmol/L (ref 3.5–5.1)
Sodium: 130 mmol/L — ABNORMAL LOW (ref 135–145)

## 2019-04-14 LAB — CBC
HCT: 40.1 % (ref 39.0–52.0)
Hemoglobin: 13.8 g/dL (ref 13.0–17.0)
MCH: 29.2 pg (ref 26.0–34.0)
MCHC: 34.4 g/dL (ref 30.0–36.0)
MCV: 84.8 fL (ref 80.0–100.0)
Platelets: 281 10*3/uL (ref 150–400)
RBC: 4.73 MIL/uL (ref 4.22–5.81)
RDW: 11.9 % (ref 11.5–15.5)
WBC: 6 10*3/uL (ref 4.0–10.5)
nRBC: 0 % (ref 0.0–0.2)

## 2019-04-14 LAB — GLUCOSE, CAPILLARY: Glucose-Capillary: 114 mg/dL — ABNORMAL HIGH (ref 70–99)

## 2019-04-14 NOTE — Discharge Summary (Signed)
Physician Discharge Summary   Douglas Newton  male DOB: December 24, 1960  [id number]  PCP: Maryland Pink, MD  Admit date: 04/13/2019 Discharge date: 04/14/2019  Admitted From: home Disposition:  home CODE STATUS: Full code  Discharge Instructions    Diet - low sodium heart healthy   Complete by: As directed    Discharge instructions   Complete by: As directed    Please stop taking your Bystolic until followup with cardiology in outpatient clinic.   Dr. Enzo Bi [facility] Course:  For full details, please see H&P, progress notes, consult notes and ancillary notes.  Briefly,  Douglas Newton  is a 59 y.o. African-American male with a known history of hypertension, seizure disorder, GERD and obesity, who presented to the emergency room with acute onset of syncope while going to the bathroom this morning.  He felt lightheaded before falling and making a hole in his drywall.   Syncope secondary to symptomatic bradycardia.   After presentation, pt's HR was found to dipped into the 40's.   Per cards, "He is followed in outpatient cardiology by Dr. Bartholome Bill.Holter monitoring this year revealed predominately sinus rhythm and sinus bradycardia with a HR range of 52 to 97bpm with an average rate of 65bpm. No evidence of significant pauses or AV dissociation.Echocardiogram stress test on 01/23/18 revealed normal resting and stress LV function with an EF estimated greater than 55% with severe LVH and no significant valvular abnormalities." Pt's home Nebivolol was held and pt will follow up with cardiology as outpatient.  Pt had no more syncope events after presentation.  Brain MRI, EEG unremarkable.  Hypokalemia, resolved Repleted.  Hyponatremia, improved -This likely hypovolemic.  Na improved with IV normal saline.  Hypertension. Continued Cozaar, HCTZ and hydralazine.  Pulmonary hypertension. Continued Revatio.   Discharge Diagnoses:  Active  Problems:   Symptomatic bradycardia    Discharge Instructions:  Allergies as of 04/14/2019   No Known Allergies     Medication List    STOP taking these medications   nebivolol 10 MG tablet Commonly known as: BYSTOLIC     TAKE these medications   carbamazepine 200 MG tablet Commonly known as: TEGRETOL Take 200 mg by mouth 4 (four) times daily.   fluticasone 50 MCG/ACT nasal spray Commonly known as: FLONASE Place 2 sprays into both nostrils daily.   hydrALAZINE 50 MG tablet Commonly known as: APRESOLINE Take 50 mg by mouth 3 (three) times daily.   hydrochlorothiazide 25 MG tablet Commonly known as: HYDRODIURIL Take 25 mg by mouth daily.   losartan 100 MG tablet Commonly known as: COZAAR Take 100 mg by mouth daily.   sildenafil 100 MG tablet Commonly known as: VIAGRA Take 100 mg by mouth daily as needed for erectile dysfunction.       Follow-up Information    Maryland Pink, MD. Schedule an appointment as soon as possible for a visit in 1 week(s).   Specialty: Family Medicine Contact information: [place] [address] [phone]           No Known Allergies   The results of significant diagnostics from this hospitalization (including imaging, microbiology, ancillary and laboratory) are listed below for reference.   Consultations:   Procedures/Studies: EEG  Result Date: 04/13/2019 Alexis Goodell, MD     04/13/2019  4:41 PM ELECTROENCEPHALOGRAM REPORT Patient: Douglas Newton       Room #: S5659237 EEG No. ID: [id number] Age: 59 y.o.  Sex: male Requesting Physician: Posey Pronto Report Date:  04/13/2019       Interpreting Physician: Alexis Goodell History: Douglas Newton is an 59 y.o. male with a history of seizures presenting after a syncopal event. Medications: Tegretol, Apresoline, Cozaar Conditions of Recording:  This is a 21 channel routine scalp EEG performed with bipolar and monopolar montages arranged in accordance to the  international 10/20 system of electrode placement. One channel was dedicated to EKG recording. The patient is in the awake, drowsy and asleep states. Description:  The waking background activity consists of a low voltage, symmetrical, fairly well organized, 8 Hz alpha activity, seen from the parieto-occipital and posterior temporal regions.  Low voltage fast activity, poorly organized, is seen anteriorly and is at times superimposed on more posterior regions.  A mixture of theta and alpha rhythms are seen from the central and temporal regions. The patient drowses with slowing to irregular, low voltage theta and beta activity.  The patient goes in to a light sleep with symmetrical sleep spindles, vertex central sharp transients and irregular slow activity. No epileptiform activity is noted.  Hyperventilation was not performed.  Intermittent photic stimulation was performed but failed to illicit any abnormalities. IMPRESSION: Normal electroencephalogram, awake, asleep and with activation procedures. There are no focal lateralizing or epileptiform features. Alexis Goodell, MD Neurology [phone] 04/13/2019, 4:39 PM   DG Chest 1 View  Result Date: 04/13/2019 CLINICAL DATA:  Fall, trauma EXAM: CHEST  1 VIEW COMPARISON:  None. FINDINGS: Remote appearing right chest wall deformity with a healed fracture of the right sixth rib posteriorly. Adjacent surgical material likely reflecting change from prior thoracotomy and lobectomy. Additional postsurgical change about the hilum. No consolidation, features of edema, pneumothorax, or effusion. Mild cardiomegaly with a calcified aorta. No acute osseous or soft tissue abnormality. IMPRESSION: Postsurgical changes in the right chest wall and hilum likely related to prior thoracotomy and lobectomy. No acute cardiopulmonary abnormality. Mild cardiomegaly. Aortic Atherosclerosis (ICD10-I70.0). Electronically Signed   By: Lovena Le M.D.   On: 04/13/2019 04:43   CT Head Wo  Contrast  Result Date: 04/13/2019 CLINICAL DATA:  Syncope, possible head strike EXAM: CT HEAD WITHOUT CONTRAST CT CERVICAL SPINE WITHOUT CONTRAST TECHNIQUE: Multidetector CT imaging of the head and cervical spine was performed following the standard protocol without intravenous contrast. Multiplanar CT image reconstructions of the cervical spine were also generated. COMPARISON:  None. FINDINGS: CT HEAD FINDINGS Brain: No evidence of acute infarction, hemorrhage, hydrocephalus, extra-axial collection or mass lesion/mass effect. Vascular: No hyperdense vessel or unexpected calcification. Skull: Mild left temporal scalp thickening without large hematoma. No calvarial fracture or suspicious osseous lesion. Sinuses/Orbits: Minimal mural thickening in the maxillary sinuses. Remaining paranasal sinuses and mastoids are predominantly clear. Included orbital structures are unremarkable. Other: None CT CERVICAL SPINE FINDINGS Alignment: Slight reversal of the lower cervical lordosis with likely degenerative stepwise anterolisthesis C3-C6. No convincing traumatic listhesis in the absence of abnormally widened, perched or jumped facets. Craniocervical and atlantoaxial articulations are maintained. Skull base and vertebrae: No acute fracture. No primary bone lesion or focal pathologic process. Soft tissues and spinal canal: No pre or paravertebral fluid or swelling. No visible canal hematoma. Disc levels: Multilevel intervertebral disc height loss with spondylitic endplate changes. Features most pronounced C5-6, C6-7 with at least mild canal stenosis at these levels. Uncinate spurring and facet hypertrophic changes are also new, mild-to-moderate at the aforementioned levels and moderate to severe on the left at C4-5. Upper chest: No acute abnormality in the upper  chest or imaged lung apices. Other: Normal thyroid. IMPRESSION: 1. Mild left temporal scalp thickening without large hematoma. No calvarial fracture. 2. No evidence  of acute intracranial findings. 3. No evidence of acute fracture or listhesis involving the cervical spine. 4. Multilevel spondylitic and facet degenerative changes in the cervical spine as detailed above. Electronically Signed   By: Lovena Le M.D.   On: 04/13/2019 04:15   CT Cervical Spine Wo Contrast  Result Date: 04/13/2019 CLINICAL DATA:  Syncope, possible head strike EXAM: CT HEAD WITHOUT CONTRAST CT CERVICAL SPINE WITHOUT CONTRAST TECHNIQUE: Multidetector CT imaging of the head and cervical spine was performed following the standard protocol without intravenous contrast. Multiplanar CT image reconstructions of the cervical spine were also generated. COMPARISON:  None. FINDINGS: CT HEAD FINDINGS Brain: No evidence of acute infarction, hemorrhage, hydrocephalus, extra-axial collection or mass lesion/mass effect. Vascular: No hyperdense vessel or unexpected calcification. Skull: Mild left temporal scalp thickening without large hematoma. No calvarial fracture or suspicious osseous lesion. Sinuses/Orbits: Minimal mural thickening in the maxillary sinuses. Remaining paranasal sinuses and mastoids are predominantly clear. Included orbital structures are unremarkable. Other: None CT CERVICAL SPINE FINDINGS Alignment: Slight reversal of the lower cervical lordosis with likely degenerative stepwise anterolisthesis C3-C6. No convincing traumatic listhesis in the absence of abnormally widened, perched or jumped facets. Craniocervical and atlantoaxial articulations are maintained. Skull base and vertebrae: No acute fracture. No primary bone lesion or focal pathologic process. Soft tissues and spinal canal: No pre or paravertebral fluid or swelling. No visible canal hematoma. Disc levels: Multilevel intervertebral disc height loss with spondylitic endplate changes. Features most pronounced C5-6, C6-7 with at least mild canal stenosis at these levels. Uncinate spurring and facet hypertrophic changes are also new,  mild-to-moderate at the aforementioned levels and moderate to severe on the left at C4-5. Upper chest: No acute abnormality in the upper chest or imaged lung apices. Other: Normal thyroid. IMPRESSION: 1. Mild left temporal scalp thickening without large hematoma. No calvarial fracture. 2. No evidence of acute intracranial findings. 3. No evidence of acute fracture or listhesis involving the cervical spine. 4. Multilevel spondylitic and facet degenerative changes in the cervical spine as detailed above. Electronically Signed   By: Lovena Le M.D.   On: 04/13/2019 04:15   MR BRAIN WO CONTRAST  Result Date: 04/13/2019 CLINICAL DATA:  Dizziness, syncope EXAM: MRI HEAD WITHOUT CONTRAST TECHNIQUE: Multiplanar, multiecho pulse sequences of the brain and surrounding structures were obtained without intravenous contrast. COMPARISON:  None. FINDINGS: Brain: There is no acute infarction or intracranial hemorrhage. There is no intracranial mass, mass effect, or edema. There is no hydrocephalus or extra-axial fluid collection. Minimal patchy T2 hyperintensity in the supratentorial white matter is nonspecific and may reflect minor chronic microvascular ischemic changes. Vascular: Major vessel flow voids at the skull base are preserved. Skull and upper cervical spine: Normal marrow signal is preserved. Sinuses/Orbits: Paranasal sinuses are aerated. Orbits are unremarkable. Other: Sella is unremarkable.  Mastoid air cells are clear. IMPRESSION: No evidence of recent infarction, hemorrhage, or mass. Electronically Signed   By: Macy Mis M.D.   On: 04/13/2019 09:41   US Carotid Bilateral  Result Date: 04/14/2019 CLINICAL DATA:  Syncope EXAM: BILATERAL CAROTID DUPLEX ULTRASOUND TECHNIQUE: Pearline Cables scale imaging, color Doppler and duplex ultrasound were performed of bilateral carotid and vertebral arteries in the neck. COMPARISON:  None. FINDINGS: Criteria: Quantification of carotid stenosis is based on velocity parameters  that correlate the residual internal carotid diameter with NASCET-based stenosis levels, using  the diameter of the distal internal carotid lumen as the denominator for stenosis measurement. The following velocity measurements were obtained: RIGHT ICA: 139/32 cm/sec CCA: [id number] cm/sec SYSTOLIC ICA/CCA RATIO:  0.7 ECA:  188 cm/sec LEFT ICA: 117/18 cm/sec CCA: [id number] cm/sec SYSTOLIC ICA/CCA RATIO:  0.6 ECA:  170 cm/sec RIGHT CAROTID ARTERY: No significant atherosclerotic plaque or evidence of stenosis in the internal carotid artery. RIGHT VERTEBRAL ARTERY:  Patent with antegrade flow. LEFT CAROTID ARTERY: No significant atherosclerotic plaque or evidence of stenosis. LEFT VERTEBRAL ARTERY:  Patent with normal antegrade flow. IMPRESSION: 1. No significant atherosclerotic plaque or stenosis in either internal carotid artery. 2. Vertebral arteries are patent with normal antegrade flow. Signed, Criselda Peaches, MD, [facility] Electronically Signed   By: Jacqulynn Cadet M.D.   On: 04/14/2019 11:08   ECHOCARDIOGRAM COMPLETE  Result Date: 04/13/2019    ECHOCARDIOGRAM REPORT   Patient Name:   Douglas Newton Date of Exam: 04/13/2019 Medical Rec #:  [id number]          Height:       66.0 in Accession #:    [id number]         Weight:       280.0 lb Date of Birth:  Apr 21, 1960          BSA:          2.307 m Patient Age:    24 years           BP:           137/67 mmHg Patient Gender: M                  HR:           64 bpm. Exam Location:  [facility] Procedure: 2D Echo, Color Doppler and Cardiac Doppler Indications:     Syncope 780.2  History:         Patient has prior history of Echocardiogram examinations, most                  recent 04/20/2015. Risk Factors:Hypertension.  Sonographer:     JERRY Referring Phys:  [id number] [place] Diagnosing Phys: Bartholome Bill MD  Sonographer Comments: Suboptimal apical window, no subcostal window and Technically challenging  study due to limited acoustic windows. IMPRESSIONS  1. Left ventricular ejection fraction, by estimation, is >75%. Left ventricular ejection fraction by PLAX is 75 %. The left ventricle has hyperdynamic function. The left ventricle has no regional wall motion abnormalities. There is moderate asymmetric left ventricular hypertrophy of the septal segment. Left ventricular diastolic parameters are consistent with Grade I diastolic dysfunction (impaired relaxation).  2. Right ventricular systolic function was not well visualized. The right ventricular size is not well visualized. There is normal pulmonary artery systolic pressure.  3. Left atrial size was mildly dilated.  4. The mitral valve was not well visualized. Trivial mitral valve regurgitation.  5. The aortic valve is grossly normal. Aortic valve regurgitation is not visualized. FINDINGS  Left Ventricle: Left ventricular ejection fraction, by estimation, is >75%. Left ventricular ejection fraction by PLAX is 75 % The left ventricle has hyperdynamic function. The left ventricle has no regional wall motion abnormalities. The left ventricular internal cavity size was small. There is moderate asymmetric left ventricular hypertrophy of the septal segment. Left ventricular diastolic parameters are consistent with Grade I diastolic dysfunction (impaired relaxation). Right Ventricle: The right ventricular size is not well visualized. Right  vetricular wall thickness was not assessed. Right ventricular systolic function was not well visualized. There is normal pulmonary artery systolic pressure. The tricuspid regurgitant velocity is 1.82 m/s, and with an assumed right atrial pressure of 10 mmHg, the estimated right ventricular systolic pressure is 123XX123 mmHg. Left Atrium: Left atrial size was mildly dilated. Right Atrium: Right atrial size was not well visualized. Pericardium: There is no evidence of pericardial effusion. Mitral Valve: The mitral valve was not well  visualized. Trivial mitral valve regurgitation. Tricuspid Valve: The tricuspid valve is not well visualized. Tricuspid valve regurgitation is trivial. Aortic Valve: The aortic valve is grossly normal. Aortic valve regurgitation is not visualized. Aortic valve mean gradient measures 8.5 mmHg. Aortic valve peak gradient measures 14.7 mmHg. Aortic valve area, by VTI measures 5.99 cm. Pulmonic Valve: The pulmonic valve was not well visualized. Pulmonic valve regurgitation is not visualized. Aorta: The aortic root was not well visualized. IAS/Shunts: The interatrial septum was not assessed.  LEFT VENTRICLE PLAX 2D LV EF:         Left            Diastology                ventricular     LV e' lateral:   3.59 cm/s                ejection        LV E/e' lateral: 17.6                fraction by     LV e' medial:    3.92 cm/s                PLAX is 75 %    LV E/e' medial:  16.1 LVIDd:         4.35 cm LVIDs:         2.44 cm LV PW:         1.68 cm LV IVS:        2.04 cm LVOT diam:     2.20 cm LV SV:         224 LV SV Index:   97 LVOT Area:     3.80 cm  RIGHT VENTRICLE RV Basal diam:  4.49 cm LEFT ATRIUM           Index       RIGHT ATRIUM           Index LA diam:      3.10 cm 1.34 cm/m  RA Area:     25.90 cm LA Vol (A4C): 88.7 ml 38.44 ml/m RA Volume:   80.60 ml  34.93 ml/m  AORTIC VALVE                    PULMONIC VALVE AV Area (Vmax):    5.21 cm     PV Vmax:        0.64 m/s AV Area (Vmean):   5.08 cm     PV Peak grad:   1.7 mmHg AV Area (VTI):     5.99 cm     RVOT Peak grad: 4 mmHg AV Vmax:           192.00 cm/s AV Vmean:          134.000 cm/s AV VTI:            0.374 m AV Peak Grad:      14.7 mmHg AV Mean Grad:  8.5 mmHg LVOT Vmax:         263.00 cm/s LVOT Vmean:        179.000 cm/s LVOT VTI:          0.589 m LVOT/AV VTI ratio: 1.57  AORTA Ao Root diam: 3.30 cm MITRAL VALVE               TRICUSPID VALVE MV Area (PHT): 2.32 cm    TR Peak grad:   13.2 mmHg MV Decel Time: 327 msec    TR Vmax:        182.00 cm/s MV  E velocity: 63.10 cm/s MV A velocity: 92.60 cm/s  SHUNTS MV E/A ratio:  0.68        Systemic VTI:  0.59 m                            Systemic Diam: 2.20 cm Bartholome Bill MD Electronically signed by Bartholome Bill MD Signature Date/Time: 04/13/2019/12:45:23 PM    Final       Labs: BNP (last 3 results) No results for input(s): BNP in the last 8760 hours. Basic Metabolic Panel: Recent Labs  Lab 04/13/19 0324 04/13/19 1853 04/14/19 0534  NA 128* 127* 130*  K 3.0* 4.6 3.6  CL 92* 93* 93*  CO2 24 27 30   GLUCOSE 171* 91 107*  BUN 15 15 15   CREATININE 1.01 0.92 0.88  CALCIUM 8.2* 8.7* 8.5*  MG 2.0  --   --    Liver Function Tests: Recent Labs  Lab 04/13/19 0324  AST 34  ALT 24  ALKPHOS 90  BILITOT 0.6  PROT 6.1*  ALBUMIN 3.5   No results for input(s): LIPASE, AMYLASE in the last 168 hours. No results for input(s): AMMONIA in the last 168 hours. CBC: Recent Labs  Lab 04/13/19 0324 04/14/19 0534  WBC 9.8 6.0  NEUTROABS 6.8  --   HGB 14.8 13.8  HCT 42.5 40.1  MCV 84.0 84.8  PLT 332 281   Cardiac Enzymes: No results for input(s): CKTOTAL, CKMB, CKMBINDEX, TROPONINI in the last 168 hours. BNP: Invalid input(s): POCBNP CBG: Recent Labs  Lab 04/14/19 0525  GLUCAP 114*   D-Dimer No results for input(s): DDIMER in the last 72 hours. Hgb A1c No results for input(s): HGBA1C in the last 72 hours. Lipid Profile No results for input(s): CHOL, HDL, LDLCALC, TRIG, CHOLHDL, LDLDIRECT in the last 72 hours. Thyroid function studies Recent Labs    04/13/19 0742  TSH 1.706   Anemia work up No results for input(s): VITAMINB12, FOLATE, FERRITIN, TIBC, IRON, RETICCTPCT in the last 72 hours. Urinalysis No results found for: COLORURINE, APPEARANCEUR, [place], [place], [place], [place], [place], [place], PROTEINUR, UROBILINOGEN, NITRITE, LEUKOCYTESUR Sepsis Labs Invalid input(s): PROCALCITONIN,  WBC,  LACTICIDVEN Microbiology Recent Results (from the past 240 hour(s))    Respiratory Panel by RT PCR (Flu A&B, Covid) - Nasopharyngeal Swab     Status: None   Collection Time: 04/13/19  5:03 AM   Specimen: Nasopharyngeal Swab  Result Value Ref Range Status   SARS Coronavirus 2 by RT PCR NEGATIVE NEGATIVE Final    Comment: (NOTE) SARS-CoV-2 target nucleic acids are NOT DETECTED. The SARS-CoV-2 RNA is generally detectable in upper respiratoy specimens during the acute phase of infection. The lowest concentration of SARS-CoV-2 viral copies this assay can detect is 131 copies/mL. A negative result does not preclude SARS-Cov-2 infection and should not be used as the sole basis for treatment  or other patient management decisions. A negative result may occur with  improper specimen collection/handling, submission of specimen other than nasopharyngeal swab, presence of viral mutation(s) within the areas targeted by this assay, and inadequate number of viral copies (<131 copies/mL). A negative result must be combined with clinical observations, patient history, and epidemiological information. The expected result is Negative. Fact Sheet for Patients:  PinkCheek.be Fact Sheet for Healthcare Providers:  GravelBags.it This test is not yet ap proved or cleared by the [place] FDA and  has been authorized for detection and/or diagnosis of SARS-CoV-2 by FDA under an Emergency Use Authorization (EUA). This EUA will remain  in effect (meaning this test can be used) for the duration of the COVID-19 declaration under Section 564(b)(1) of the Act, 21 U.S.C. section 360bbb-3(b)(1), unless the authorization is terminated or revoked sooner.    Influenza A by PCR NEGATIVE NEGATIVE Final   Influenza B by PCR NEGATIVE NEGATIVE Final    Comment: (NOTE) The Xpert Xpress SARS-CoV-2/FLU/RSV assay is intended as an aid in  the diagnosis of influenza from Nasopharyngeal swab specimens and  should not be used as a sole  basis for treatment. Nasal washings and  aspirates are unacceptable for Xpert Xpress SARS-CoV-2/FLU/RSV  testing. Fact Sheet for Patients: PinkCheek.be Fact Sheet for Healthcare Providers: GravelBags.it This test is not yet approved or cleared by the [place] FDA and  has been authorized for detection and/or diagnosis of SARS-CoV-2 by  FDA under an Emergency Use Authorization (EUA). This EUA will remain  in effect (meaning this test can be used) for the duration of the  Covid-19 declaration under Section 564(b)(1) of the Act, 21  U.S.C. section 360bbb-3(b)(1), unless the authorization is  terminated or revoked. Performed at [facility], [address] [place],  [id number]      Total time spend on discharging this patient, including the last patient exam, discussing the hospital stay, instructions for ongoing care as it relates to all pertinent caregivers, as well as preparing the medical discharge records, prescriptions, and/or referrals as applicable, is 30 minutes.    Enzo Bi, MD  [facility] 04/14/2019, 2:49 PM  If 7PM-7AM, please contact night-coverage

## 2019-04-14 NOTE — Progress Notes (Signed)
[facility] Cardiology    SUBJECTIVE: Mr. Wurtzel is a 59 year old male with a past medical history significant for left ventricular hypertrophy, seizure disorder, essential hypertension, hyperlipidemia, and recurrent syncopal episodes who presented to the ED on 04/13/19 following a syncopal event. He reports that he was in his normal state of health until two nights ago when he woke up to use the restroom.  He apparently passed out and woke up on the floor with a hole through the drywall.  Of note, he reports poor oral intake the day prior.  Workup in the ED was significant for sodium of 128, potassium of 3.0, initial high sensitivity troponin of 20, COVID-19 negative, and ECG revealing sinus rhythm at a ventricular rate of 70bpm with severe LVH.  He was noted to have episodic bradycardia on telemetry and nebivolol was held.  He reports that he has significantly improved since admission and denies chest pain, palpitations, shortness of breath, lower extremity swelling, orthopnea, or PND.  He denies any recurrent syncopal or presyncopal events.    He is followed in outpatient cardiology by Dr. Bartholome Bill. Holter monitoring this year revealed predominately sinus rhythm and sinus bradycardia with a HR range of 52 to 97bpm with an average rate of 65bpm.  No evidence of significant pauses or AV dissociation. Echocardiogram stress test on 01/23/18 revealed normal resting and stress LV function with an EF estimated greater than 55% with severe LVH and no significant valvular abnormalities.    Vitals:   04/13/19 1950 04/13/19 2025 04/14/19 0428 04/14/19 0818  BP: 127/62 (!) 116/53 130/62 124/66  Pulse: 63 64 64 (!) 58  Resp: 16   19  Temp:  98.7 F (37.1 C) 98.7 F (37.1 C) 98.4 F (36.9 C)  TempSrc:  Oral Oral Oral  SpO2: 99% 100% 98% 100%  Weight:  125.5 kg 125.2 kg   Height:  5\' 6"  (1.676 m)       Intake/Output Summary (Last 24 hours) at 04/14/2019 [id number] Last data filed at 04/14/2019 0300 Gross per 24  hour  Intake 240 ml  Output 0 ml  Net 240 ml      PHYSICAL EXAM  General: Well developed, well nourished, in no acute distress HEENT:  Normocephalic and atramatic Neck:  No JVD.  Lungs: Clear bilaterally to auscultation and percussion. Heart: HRRR . Normal S1 and S2 without gallops or murmurs.  Abdomen: Bowel sounds are positive, abdomen soft and non-tender  Msk:  Back normal. Normal strength and tone for age. Extremities: No clubbing, cyanosis or edema.   Neuro: Alert and oriented X 3. Psych:  Good affect, responds appropriately   LABS: Basic Metabolic Panel: Recent Labs    04/13/19 0324 04/13/19 0324 04/13/19 1853 04/14/19 0534  NA 128*   < > 127* 130*  K 3.0*   < > 4.6 3.6  CL 92*   < > 93* 93*  CO2 24   < > 27 30  GLUCOSE 171*   < > 91 107*  BUN 15   < > 15 15  CREATININE 1.01   < > 0.92 0.88  CALCIUM 8.2*   < > 8.7* 8.5*  MG 2.0  --   --   --    < > = values in this interval not displayed.   Liver Function Tests: Recent Labs    04/13/19 0324  AST 34  ALT 24  ALKPHOS 90  BILITOT 0.6  PROT 6.1*  ALBUMIN 3.5   No results for  input(s): LIPASE, AMYLASE in the last 72 hours. CBC: Recent Labs    04/13/19 0324 04/14/19 0534  WBC 9.8 6.0  NEUTROABS 6.8  --   HGB 14.8 13.8  HCT 42.5 40.1  MCV 84.0 84.8  PLT 332 281   Cardiac Enzymes: No results for input(s): CKTOTAL, CKMB, CKMBINDEX, TROPONINI in the last 72 hours. BNP: Invalid input(s): POCBNP D-Dimer: No results for input(s): DDIMER in the last 72 hours. Hemoglobin A1C: No results for input(s): HGBA1C in the last 72 hours. Fasting Lipid Panel: No results for input(s): CHOL, HDL, LDLCALC, TRIG, CHOLHDL, LDLDIRECT in the last 72 hours. Thyroid Function Tests: Recent Labs    04/13/19 0742  TSH 1.706   Anemia Panel: No results for input(s): VITAMINB12, FOLATE, FERRITIN, TIBC, IRON, RETICCTPCT in the last 72 hours.  EEG  Result Date: 04/13/2019 Alexis Goodell, MD     04/13/2019  4:41 PM  ELECTROENCEPHALOGRAM REPORT Patient: ABDURRAHIM GOZMAN       Room #: [id number] EEG No. ID: [id number] Age: 59 y.o.        Sex: male Requesting Physician: Posey Pronto Report Date:  04/13/2019       Interpreting Physician: Alexis Goodell History: WILVER ENDRIZZI is an 59 y.o. male with a history of seizures presenting after a syncopal event. Medications: Tegretol, Apresoline, Cozaar Conditions of Recording:  This is a 21 channel routine scalp EEG performed with bipolar and monopolar montages arranged in accordance to the international 10/20 system of electrode placement. One channel was dedicated to EKG recording. The patient is in the awake, drowsy and asleep states. Description:  The waking background activity consists of a low voltage, symmetrical, fairly well organized, 8 Hz alpha activity, seen from the parieto-occipital and posterior temporal regions.  Low voltage fast activity, poorly organized, is seen anteriorly and is at times superimposed on more posterior regions.  A mixture of theta and alpha rhythms are seen from the central and temporal regions. The patient drowses with slowing to irregular, low voltage theta and beta activity.  The patient goes in to a light sleep with symmetrical sleep spindles, vertex central sharp transients and irregular slow activity. No epileptiform activity is noted.  Hyperventilation was not performed.  Intermittent photic stimulation was performed but failed to illicit any abnormalities. IMPRESSION: Normal electroencephalogram, awake, asleep and with activation procedures. There are no focal lateralizing or epileptiform features. Alexis Goodell, MD Neurology [phone] 04/13/2019, 4:39 PM   DG Chest 1 View  Result Date: 04/13/2019 CLINICAL DATA:  Fall, trauma EXAM: CHEST  1 VIEW COMPARISON:  None. FINDINGS: Remote appearing right chest wall deformity with a healed fracture of the right sixth rib posteriorly. Adjacent surgical material likely reflecting change from prior  thoracotomy and lobectomy. Additional postsurgical change about the hilum. No consolidation, features of edema, pneumothorax, or effusion. Mild cardiomegaly with a calcified aorta. No acute osseous or soft tissue abnormality. IMPRESSION: Postsurgical changes in the right chest wall and hilum likely related to prior thoracotomy and lobectomy. No acute cardiopulmonary abnormality. Mild cardiomegaly. Aortic Atherosclerosis (ICD10-I70.0). Electronically Signed   By: Lovena Le M.D.   On: 04/13/2019 04:43   CT Head Wo Contrast  Result Date: 04/13/2019 CLINICAL DATA:  Syncope, possible head strike EXAM: CT HEAD WITHOUT CONTRAST CT CERVICAL SPINE WITHOUT CONTRAST TECHNIQUE: Multidetector CT imaging of the head and cervical spine was performed following the standard protocol without intravenous contrast. Multiplanar CT image reconstructions of the cervical spine were also generated. COMPARISON:  None. FINDINGS: CT HEAD FINDINGS Brain:  No evidence of acute infarction, hemorrhage, hydrocephalus, extra-axial collection or mass lesion/mass effect. Vascular: No hyperdense vessel or unexpected calcification. Skull: Mild left temporal scalp thickening without large hematoma. No calvarial fracture or suspicious osseous lesion. Sinuses/Orbits: Minimal mural thickening in the maxillary sinuses. Remaining paranasal sinuses and mastoids are predominantly clear. Included orbital structures are unremarkable. Other: None CT CERVICAL SPINE FINDINGS Alignment: Slight reversal of the lower cervical lordosis with likely degenerative stepwise anterolisthesis C3-C6. No convincing traumatic listhesis in the absence of abnormally widened, perched or jumped facets. Craniocervical and atlantoaxial articulations are maintained. Skull base and vertebrae: No acute fracture. No primary bone lesion or focal pathologic process. Soft tissues and spinal canal: No pre or paravertebral fluid or swelling. No visible canal hematoma. Disc levels:  Multilevel intervertebral disc height loss with spondylitic endplate changes. Features most pronounced C5-6, C6-7 with at least mild canal stenosis at these levels. Uncinate spurring and facet hypertrophic changes are also new, mild-to-moderate at the aforementioned levels and moderate to severe on the left at C4-5. Upper chest: No acute abnormality in the upper chest or imaged lung apices. Other: Normal thyroid. IMPRESSION: 1. Mild left temporal scalp thickening without large hematoma. No calvarial fracture. 2. No evidence of acute intracranial findings. 3. No evidence of acute fracture or listhesis involving the cervical spine. 4. Multilevel spondylitic and facet degenerative changes in the cervical spine as detailed above. Electronically Signed   By: Lovena Le M.D.   On: 04/13/2019 04:15   CT Cervical Spine Wo Contrast  Result Date: 04/13/2019 CLINICAL DATA:  Syncope, possible head strike EXAM: CT HEAD WITHOUT CONTRAST CT CERVICAL SPINE WITHOUT CONTRAST TECHNIQUE: Multidetector CT imaging of the head and cervical spine was performed following the standard protocol without intravenous contrast. Multiplanar CT image reconstructions of the cervical spine were also generated. COMPARISON:  None. FINDINGS: CT HEAD FINDINGS Brain: No evidence of acute infarction, hemorrhage, hydrocephalus, extra-axial collection or mass lesion/mass effect. Vascular: No hyperdense vessel or unexpected calcification. Skull: Mild left temporal scalp thickening without large hematoma. No calvarial fracture or suspicious osseous lesion. Sinuses/Orbits: Minimal mural thickening in the maxillary sinuses. Remaining paranasal sinuses and mastoids are predominantly clear. Included orbital structures are unremarkable. Other: None CT CERVICAL SPINE FINDINGS Alignment: Slight reversal of the lower cervical lordosis with likely degenerative stepwise anterolisthesis C3-C6. No convincing traumatic listhesis in the absence of abnormally widened,  perched or jumped facets. Craniocervical and atlantoaxial articulations are maintained. Skull base and vertebrae: No acute fracture. No primary bone lesion or focal pathologic process. Soft tissues and spinal canal: No pre or paravertebral fluid or swelling. No visible canal hematoma. Disc levels: Multilevel intervertebral disc height loss with spondylitic endplate changes. Features most pronounced C5-6, C6-7 with at least mild canal stenosis at these levels. Uncinate spurring and facet hypertrophic changes are also new, mild-to-moderate at the aforementioned levels and moderate to severe on the left at C4-5. Upper chest: No acute abnormality in the upper chest or imaged lung apices. Other: Normal thyroid. IMPRESSION: 1. Mild left temporal scalp thickening without large hematoma. No calvarial fracture. 2. No evidence of acute intracranial findings. 3. No evidence of acute fracture or listhesis involving the cervical spine. 4. Multilevel spondylitic and facet degenerative changes in the cervical spine as detailed above. Electronically Signed   By: Lovena Le M.D.   On: 04/13/2019 04:15   MR BRAIN WO CONTRAST  Result Date: 04/13/2019 CLINICAL DATA:  Dizziness, syncope EXAM: MRI HEAD WITHOUT CONTRAST TECHNIQUE: Multiplanar, multiecho pulse sequences of the brain  and surrounding structures were obtained without intravenous contrast. COMPARISON:  None. FINDINGS: Brain: There is no acute infarction or intracranial hemorrhage. There is no intracranial mass, mass effect, or edema. There is no hydrocephalus or extra-axial fluid collection. Minimal patchy T2 hyperintensity in the supratentorial white matter is nonspecific and may reflect minor chronic microvascular ischemic changes. Vascular: Major vessel flow voids at the skull base are preserved. Skull and upper cervical spine: Normal marrow signal is preserved. Sinuses/Orbits: Paranasal sinuses are aerated. Orbits are unremarkable. Other: Sella is unremarkable.   Mastoid air cells are clear. IMPRESSION: No evidence of recent infarction, hemorrhage, or mass. Electronically Signed   By: Macy Mis M.D.   On: 04/13/2019 09:41   ECHOCARDIOGRAM COMPLETE  Result Date: 04/13/2019    ECHOCARDIOGRAM REPORT   Patient Name:   KEI SARWAR Date of Exam: 04/13/2019 Medical Rec #:  [id number]          Height:       66.0 in Accession #:    [id number]         Weight:       280.0 lb Date of Birth:  29-Jun-1960          BSA:          2.307 m Patient Age:    25 years           BP:           137/67 mmHg Patient Gender: M                  HR:           64 bpm. Exam Location:  [facility] Procedure: 2D Echo, Color Doppler and Cardiac Doppler Indications:     Syncope 780.2  History:         Patient has prior history of Echocardiogram examinations, most                  recent 04/20/2015. Risk Factors:Hypertension.  Sonographer:     JERRY Referring Phys:  [id number] [place] Diagnosing Phys: Bartholome Bill MD  Sonographer Comments: Suboptimal apical window, no subcostal window and Technically challenging study due to limited acoustic windows. IMPRESSIONS  1. Left ventricular ejection fraction, by estimation, is >75%. Left ventricular ejection fraction by PLAX is 75 %. The left ventricle has hyperdynamic function. The left ventricle has no regional wall motion abnormalities. There is moderate asymmetric left ventricular hypertrophy of the septal segment. Left ventricular diastolic parameters are consistent with Grade I diastolic dysfunction (impaired relaxation).  2. Right ventricular systolic function was not well visualized. The right ventricular size is not well visualized. There is normal pulmonary artery systolic pressure.  3. Left atrial size was mildly dilated.  4. The mitral valve was not well visualized. Trivial mitral valve regurgitation.  5. The aortic valve is grossly normal. Aortic valve regurgitation is not visualized. FINDINGS  Left Ventricle: Left ventricular ejection fraction, by  estimation, is >75%. Left ventricular ejection fraction by PLAX is 75 % The left ventricle has hyperdynamic function. The left ventricle has no regional wall motion abnormalities. The left ventricular internal cavity size was small. There is moderate asymmetric left ventricular hypertrophy of the septal segment. Left ventricular diastolic parameters are consistent with Grade I diastolic dysfunction (impaired relaxation). Right Ventricle: The right ventricular size is not well visualized. Right vetricular wall thickness was not assessed. Right ventricular systolic function was not well visualized. There is normal pulmonary artery systolic pressure. The tricuspid regurgitant velocity  is 1.82 m/s, and with an assumed right atrial pressure of 10 mmHg, the estimated right ventricular systolic pressure is 123XX123 mmHg. Left Atrium: Left atrial size was mildly dilated. Right Atrium: Right atrial size was not well visualized. Pericardium: There is no evidence of pericardial effusion. Mitral Valve: The mitral valve was not well visualized. Trivial mitral valve regurgitation. Tricuspid Valve: The tricuspid valve is not well visualized. Tricuspid valve regurgitation is trivial. Aortic Valve: The aortic valve is grossly normal. Aortic valve regurgitation is not visualized. Aortic valve mean gradient measures 8.5 mmHg. Aortic valve peak gradient measures 14.7 mmHg. Aortic valve area, by VTI measures 5.99 cm. Pulmonic Valve: The pulmonic valve was not well visualized. Pulmonic valve regurgitation is not visualized. Aorta: The aortic root was not well visualized. IAS/Shunts: The interatrial septum was not assessed.  LEFT VENTRICLE PLAX 2D LV EF:         Left            Diastology                ventricular     LV e' lateral:   3.59 cm/s                ejection        LV E/e' lateral: 17.6                fraction by     LV e' medial:    3.92 cm/s                PLAX is 75 %    LV E/e' medial:  16.1 LVIDd:         4.35 cm LVIDs:          2.44 cm LV PW:         1.68 cm LV IVS:        2.04 cm LVOT diam:     2.20 cm LV SV:         224 LV SV Index:   97 LVOT Area:     3.80 cm  RIGHT VENTRICLE RV Basal diam:  4.49 cm LEFT ATRIUM           Index       RIGHT ATRIUM           Index LA diam:      3.10 cm 1.34 cm/m  RA Area:     25.90 cm LA Vol (A4C): 88.7 ml 38.44 ml/m RA Volume:   80.60 ml  34.93 ml/m  AORTIC VALVE                    PULMONIC VALVE AV Area (Vmax):    5.21 cm     PV Vmax:        0.64 m/s AV Area (Vmean):   5.08 cm     PV Peak grad:   1.7 mmHg AV Area (VTI):     5.99 cm     RVOT Peak grad: 4 mmHg AV Vmax:           192.00 cm/s AV Vmean:          134.000 cm/s AV VTI:            0.374 m AV Peak Grad:      14.7 mmHg AV Mean Grad:      8.5 mmHg LVOT Vmax:         263.00 cm/s LVOT Vmean:  179.000 cm/s LVOT VTI:          0.589 m LVOT/AV VTI ratio: 1.57  AORTA Ao Root diam: 3.30 cm MITRAL VALVE               TRICUSPID VALVE MV Area (PHT): 2.32 cm    TR Peak grad:   13.2 mmHg MV Decel Time: 327 msec    TR Vmax:        182.00 cm/s MV E velocity: 63.10 cm/s MV A velocity: 92.60 cm/s  SHUNTS MV E/A ratio:  0.68        Systemic VTI:  0.59 m                            Systemic Diam: 2.20 cm Bartholome Bill MD Electronically signed by Bartholome Bill MD Signature Date/Time: 04/13/2019/12:45:23 PM    Final      Echo: Hyperdynamic LV systolic function with an EF estimated greater than 75% with moderate, asymmetric LVH; grade 1 diastolic function; left ventricle cavity small in size    TELEMETRY: Normal sinus rhythm   ASSESSMENT AND PLAN:  Active Problems:   Symptomatic bradycardia    1.  Symptomatic bradycardia   -Agree with plan to continue to hold nebivolol; would not resume at discharge and we will make further recommendations in an outpatient setting   2.  Syncope   -In the setting of hypokalemia and hyponatremia   -Brain MRI, EEG unremarkable   3.  LVH  -Moderate asymmetric hypertrophy; further workup/treatment options  will be carried out in an outpatient setting   The history, physical exam findings, and plan of care were all discussed with Dr. Bartholome Bill, and all decision making was made in collaboration.   Avie Arenas  PA-C 04/14/2019 9:07 AM

## 2019-04-14 NOTE — Progress Notes (Signed)
Patient taken off the floor via wheelchair, patient discharged home

## 2019-04-14 NOTE — Progress Notes (Signed)
Douglas Newton to be D/C'd Home per MD order.  Discussed prescriptions and follow up appointments with the patient. No Prescriptions given to patient, medication list explained in detail. Pt verbalized understanding.  Allergies as of 04/14/2019   No Known Allergies     Medication List    STOP taking these medications   nebivolol 10 MG tablet Commonly known as: BYSTOLIC     TAKE these medications   carbamazepine 200 MG tablet Commonly known as: TEGRETOL Take 200 mg by mouth 4 (four) times daily.   fluticasone 50 MCG/ACT nasal spray Commonly known as: FLONASE Place 2 sprays into both nostrils daily.   hydrALAZINE 50 MG tablet Commonly known as: APRESOLINE Take 50 mg by mouth 3 (three) times daily.   hydrochlorothiazide 25 MG tablet Commonly known as: HYDRODIURIL Take 25 mg by mouth daily.   losartan 100 MG tablet Commonly known as: COZAAR Take 100 mg by mouth daily.   sildenafil 100 MG tablet Commonly known as: VIAGRA Take 100 mg by mouth daily as needed for erectile dysfunction.       Vitals:   04/14/19 1142 04/14/19 1542  BP: (!) 111/48 (!) 113/58  Pulse: 64 63  Resp: 19 19  Temp: 98.4 F (36.9 C) 98.3 F (36.8 C)  SpO2: 100% 100%    Skin clean, dry and intact without evidence of skin break down, no evidence of skin tears noted. IV catheter discontinued intact. Site without signs and symptoms of complications. Dressing and pressure applied. Pt denies pain at this time. No complaints noted.  An After Visit Summary was printed and given to the patient. Patient waiting on ride to be discharged home  [place]

## 2019-10-25 ENCOUNTER — Telehealth: Payer: Self-pay

## 2019-10-25 NOTE — Telephone Encounter (Signed)
Left message for patient to return call to office regarding scheduling colonoscopy- ask if he wishes to remain with Dr.Byrnett if not referral needs to be placed in epic for [facility].

## 2019-10-25 NOTE — Telephone Encounter (Signed)
Incoming call from patient.  He wishes to remain with Dr. Fleet Contras for his care.  He is given the number of [phone] to call.

## 2019-12-08 ENCOUNTER — Other Ambulatory Visit: Payer: Self-pay | Admitting: General Surgery

## 2019-12-08 NOTE — Progress Notes (Signed)
Subjective:     Patient ID: Douglas Newton is a 59 y.o. male.  HPI  The following portions of the patient's history were reviewed and updated as appropriate.  This an established patient is here today for: office visit. Here to discuss having a colonoscopy, last completed in 2018. Bowels move daily, no bleeding or rectal pain.  Review of Systems  Constitutional: Negative for chills and fever.  Respiratory: Negative for cough.     Chief Complaint  Patient presents with  . Follow-up    discuss colonoscopy     BP 144/65   Pulse 75   Temp 36.8 C (98.3 F)   Ht 167.6 cm (5\' 6" )   Wt (!) 111.1 kg (245 lb)   BMI 39.54 kg/m   Past Medical History:  Diagnosis Date  . Arthritis 2011  . Chickenpox   . Colon polyp 01/15/2012  . Diarrhea   . GERD (gastroesophageal reflux disease)   . Hyperlipidemia   . Hypertension   . Obesity   . Seizure disorder (CMS-HCC) 1965  . Syncope and collapse   . Umbilical hernia   . Ventral hernia      Past Surgical History:  Procedure Laterality Date  . abdominal surgery  1965  . COLONOSCOPY  12/04/2016  . COLONOSCOPY  01/15/2012  . KNEE ARTHROSCOPY Right 01/28/1981  . left knee surgery in August 2011    . Removal of 1/3 of right lung, 1965    . Removal of wires from underarm, 1970    . REPAIR INCISIONAL/VENTRAL HERNIA  12/2011  . Tracheotomy, 1965    . Tube in stomach, 1965          Social History   Socioeconomic History  . Marital status: Widowed    Spouse name: Not on file  . Number of children: Not on file  . Years of education: Not on file  . Highest education level: Not on file  Occupational History  . Not on file  Tobacco Use  . Smoking status: Former Smoker    Packs/day: 1.00    Quit date: 06/28/2013    Years since quitting: 6.4  . Smokeless tobacco: Never Used  Substance and Sexual Activity  . Alcohol use: Yes    Alcohol/week: 0.0 standard drinks  . Drug use: Not on file  . Sexual activity: Not on file   Other Topics Concern  . Not on file  Social History Narrative  . Not on file   Social Determinants of Health   Financial Resource Strain: Not on file  Food Insecurity: Not on file  Transportation Needs: Not on file     No Known Allergies  Current Outpatient Medications  Medication Sig Dispense Refill  . carBAMazepine (TEGRETOL) 200 mg tablet TAKE 1 TABLET BY MOUTH 4  TIMES DAILY 360 tablet 3  . fluticasone propionate (FLONASE) 50 mcg/actuation nasal spray USE 2 SPRAYS INTO BOTH  NOSTRILS ONCE A DAY AS  NEEDED 48 g 3  . hydrALAZINE (APRESOLINE) 50 MG tablet TAKE 1 TABLET BY MOUTH 3  TIMES DAILY 270 tablet 3  . losartan (COZAAR) 100 MG tablet TAKE 1 TABLET BY MOUTH ONCE DAILY 90 tablet 3  . sildenafiL, pulm.hypertension, (REVATIO) 20 mg tablet Take 1 tablet (20 mg total) by mouth as directed Take 3-5 tablets as needed. 30 tablet 11   No current facility-administered medications for this visit.    Family History  Problem Relation Age of Onset  . Brain cancer Mother  neuroblastoma  . Myocardial Infarction (Heart attack) Mother   . Myocardial Infarction (Heart attack) Maternal Grandmother   . Myocardial Infarction (Heart attack) Maternal Grandfather   . High blood pressure (Hypertension) Paternal Grandmother   . High blood pressure (Hypertension) Paternal Grandfather          Objective:   Physical Exam Constitutional:      Appearance: Normal appearance.  Cardiovascular:     Rate and Rhythm: Normal rate and regular rhythm.     Pulses: Normal pulses.     Heart sounds: Normal heart sounds.  Pulmonary:     Effort: Pulmonary effort is normal.     Breath sounds: Normal breath sounds.  Musculoskeletal:     Cervical back: Neck supple.  Skin:    General: Skin is warm and dry.  Neurological:     Mental Status: He is alert and oriented to person, place, and time.  Psychiatric:        Mood and Affect: Mood normal.        Behavior: Behavior normal.    Labs and  Radiology:  12/04/2016 colonoscopy:  DIAGNOSIS:  A. COLON POLYPS 2, CECUM AND ASCENDING; HOT SNARE:  - TUBULOVILLOUS ADENOMAS (2).  - NEGATIVE FOR HIGH-GRADE DYSPLASIA AND MALIGNANCY.   B. COLON POLYP, TRANSVERSE; COLD BIOPSY:  - TUBULAR ADENOMA.  - NEGATIVE FOR HIGH-GRADE DYSPLASIA AND MALIGNANCY.   C. COLON POLYP, SIGMOID; HOT SNARE:  - TUBULOVILLOUS ADENOMA.  - NEGATIVE FOR HIGH-GRADE DYSPLASIA AND MALIGNANCY.       Assessment:     Candidate for repeat colonoscopy based on multiple polyps in 2018.    Plan:     Indications for repeat colonoscopy were reviewed.    The procedure is tentatively scheduled for 12/17/2019 at [facility].         Entered by Karie Fetch, RN, acting as a scribe for Dr. Hervey Ard, MD.  The documentation recorded by the scribe accurately reflects the service I personally performed and the decisions made by me.   Robert Bellow, MD FACS

## 2019-12-15 ENCOUNTER — Other Ambulatory Visit
Admission: RE | Admit: 2019-12-15 | Discharge: 2019-12-15 | Disposition: A | Discharge status: Home / Self Care | Payer: Managed Care, Other (non HMO) | Source: Ambulatory Visit | Attending: General Surgery | Admitting: General Surgery

## 2019-12-15 ENCOUNTER — Other Ambulatory Visit: Payer: Self-pay

## 2019-12-15 DIAGNOSIS — Z01812 Encounter for preprocedural laboratory examination: Secondary | ICD-10-CM | POA: Diagnosis present

## 2019-12-15 DIAGNOSIS — Z20822 Contact with and (suspected) exposure to covid-19: Secondary | ICD-10-CM | POA: Diagnosis not present

## 2019-12-15 LAB — SARS CORONAVIRUS 2 (TAT 6-24 HRS): SARS Coronavirus 2: NEGATIVE

## 2019-12-16 ENCOUNTER — Other Ambulatory Visit: Payer: Managed Care, Other (non HMO)

## 2019-12-16 ENCOUNTER — Encounter: Payer: Self-pay | Admitting: General Surgery

## 2019-12-17 ENCOUNTER — Encounter
Admission: RE | Disposition: A | Discharge status: Home / Self Care | Payer: Self-pay | Source: Ambulatory Visit | Attending: General Surgery

## 2019-12-17 ENCOUNTER — Ambulatory Visit: Payer: Managed Care, Other (non HMO) | Admitting: Certified Registered Nurse Anesthetist

## 2019-12-17 ENCOUNTER — Ambulatory Visit
Admission: RE | Admit: 2019-12-17 | Discharge: 2019-12-17 | Disposition: A | Discharge status: Home / Self Care | Payer: Managed Care, Other (non HMO) | Source: Ambulatory Visit | Attending: General Surgery | Admitting: General Surgery

## 2019-12-17 ENCOUNTER — Encounter: Payer: Self-pay | Admitting: General Surgery

## 2019-12-17 ENCOUNTER — Other Ambulatory Visit: Payer: Self-pay

## 2019-12-17 DIAGNOSIS — Z79899 Other long term (current) drug therapy: Secondary | ICD-10-CM | POA: Insufficient documentation

## 2019-12-17 DIAGNOSIS — D120 Benign neoplasm of cecum: Secondary | ICD-10-CM | POA: Diagnosis not present

## 2019-12-17 DIAGNOSIS — Z86010 Personal history of colonic polyps: Secondary | ICD-10-CM | POA: Insufficient documentation

## 2019-12-17 DIAGNOSIS — F17290 Nicotine dependence, other tobacco product, uncomplicated: Secondary | ICD-10-CM | POA: Diagnosis not present

## 2019-12-17 DIAGNOSIS — Z1211 Encounter for screening for malignant neoplasm of colon: Secondary | ICD-10-CM | POA: Insufficient documentation

## 2019-12-17 DIAGNOSIS — D123 Benign neoplasm of transverse colon: Secondary | ICD-10-CM | POA: Insufficient documentation

## 2019-12-17 DIAGNOSIS — Z902 Acquired absence of lung [part of]: Secondary | ICD-10-CM | POA: Insufficient documentation

## 2019-12-17 HISTORY — PX: COLONOSCOPY WITH PROPOFOL: SHX5780

## 2019-12-17 HISTORY — DX: Syncope and collapse: R55

## 2019-12-17 HISTORY — DX: Varicella without complication: B01.9

## 2019-12-17 HISTORY — DX: Hyperlipidemia, unspecified: E78.5

## 2019-12-17 SURGERY — COLONOSCOPY WITH PROPOFOL
Anesthesia: General

## 2019-12-17 MED ORDER — PROPOFOL 500 MG/50ML IV EMUL
INTRAVENOUS | Status: AC
  Filled 2019-12-17: qty 50

## 2019-12-17 MED ORDER — PROPOFOL 10 MG/ML IV BOLUS
INTRAVENOUS | Status: DC | PRN
  Administered 2019-12-17: 70 mg via INTRAVENOUS

## 2019-12-17 MED ORDER — SODIUM CHLORIDE 0.9 % IV SOLN: INTRAVENOUS | Status: DC

## 2019-12-17 MED ORDER — LIDOCAINE HCL (PF) 2 % IJ SOLN
INTRAMUSCULAR | Status: AC
  Filled 2019-12-17: qty 5

## 2019-12-17 MED ORDER — PROPOFOL 500 MG/50ML IV EMUL
INTRAVENOUS | Status: DC | PRN
  Administered 2019-12-17: 175 ug/kg/min via INTRAVENOUS

## 2019-12-17 MED ORDER — LIDOCAINE HCL (CARDIAC) PF 100 MG/5ML IV SOSY
PREFILLED_SYRINGE | INTRAVENOUS | Status: DC | PRN
  Administered 2019-12-17: 50 mg via INTRAVENOUS

## 2019-12-17 MED ORDER — PROPOFOL 10 MG/ML IV BOLUS
INTRAVENOUS | Status: AC
  Filled 2019-12-17: qty 20

## 2019-12-17 NOTE — H&P (Signed)
[name] [id number] 08/08/1960     HPI:  59 y/o male with multiple polyps, tubulovillous adenoma and tubular adenomas at the time of his 2018 colonoscopy. For f/u exam.   Medications Prior to Admission  Medication Sig Dispense Refill Last Dose  . carbamazepine (TEGRETOL) 200 MG tablet Take 200 mg by mouth 4 (four) times daily.   12/17/2019 at 0615  . fluticasone (FLONASE) 50 MCG/ACT nasal spray Place 2 sprays into both nostrils daily.   12/17/2019 at 0615  . hydrALAZINE (APRESOLINE) 50 MG tablet Take 50 mg by mouth 3 (three) times daily.   12/16/2019 at 2300  . hydrochlorothiazide (HYDRODIURIL) 25 MG tablet Take 25 mg by mouth daily.   Past Week at Unknown time  . losartan (COZAAR) 100 MG tablet Take 100 mg by mouth daily.   12/16/2019 at 0800  . sildenafil (VIAGRA) 100 MG tablet Take 100 mg by mouth daily as needed for erectile dysfunction.      No Known Allergies Past Medical History:  Diagnosis Date  . Arthritis 2011  . Chicken pox   . Colon polyp 01/15/2012  . Diarrhea   . GERD (gastroesophageal reflux disease)   . Hernia 2007   admits 2006  . Hyperlipemia   . Hypertension admits 2011  . Obesity, unspecified   . Personal history of tobacco use, presenting hazards to health   . Screening for obesity   . Seizure disorder ([place]) 1965   admits 1964  . Syncope and collapse   . Ulcer 1975   admits 1974  . Umbilical hernia without mention of obstruction or gangrene   . Ventral hernia, unspecified, without mention of obstruction or gangrene    Past Surgical History:  Procedure Laterality Date  . ABDOMINAL SURGERY  1965  . arm surgery  1969  . COLONOSCOPY  01/15/2012   Dr Douglas Newton  . COLONOSCOPY WITH PROPOFOL N/A 12/04/2016   Procedure: COLONOSCOPY WITH PROPOFOL;  Surgeon: Douglas Bellow, MD;  Location: [facility] ENDOSCOPY;  Service: Endoscopy;  Laterality: N/A;  . HERNIA REPAIR  December 2013   ventral hernia,umbilical hernia  . KNEE ARTHROSCOPY  October 1983    right knee  . KNEE SURGERY  October 2011   left knee  . LOBECTOMY  May 1965  . stomach tube  1965  . tracheotomy  1965  . wires removed from under arm  1963   Social History   Socioeconomic History  . Marital status: Widowed    Spouse name: Not on file  . Number of children: Not on file  . Years of education: Not on file  . Highest education level: Not on file  Occupational History  . Not on file  Tobacco Use  . Smoking status: Current Some Day Smoker    Packs/day: 0.00    Years: 0.00    Pack years: 0.00    Types: Cigars  . Smokeless tobacco: Never Used  Vaping Use  . Vaping Use: Never used  Substance and Sexual Activity  . Alcohol use: Yes    Comment: rarely  . Drug use: No  . Sexual activity: Not on file  Other Topics Concern  . Not on file  Social History Narrative   ** Merged History Encounter **       Social Determinants of Health   Financial Resource Strain:   . Difficulty of Paying Living Expenses: Not on file  Food Insecurity:   . Worried About Charity fundraiser in the Last Year: Not on  file  . [place] in the Last Year: Not on file  Transportation Needs:   . Lack of Transportation (Medical): Not on file  . Lack of Transportation (Non-Medical): Not on file  Physical Activity:   . Days of Exercise per Week: Not on file  . Minutes of Exercise per Session: Not on file  Stress:   . Feeling of Stress : Not on file  Social Connections:   . Frequency of Communication with Friends and Family: Not on file  . Frequency of Social Gatherings with Friends and Family: Not on file  . Attends Religious Services: Not on file  . Active Member of Clubs or Organizations: Not on file  . Attends Archivist Meetings: Not on file  . Marital Status: Not on file  Intimate Partner Violence:   . Fear of Current or Ex-Partner: Not on file  . Emotionally Abused: Not on file  . Physically Abused: Not on file  . Sexually Abused: Not on file   Social  History   Social History Narrative   ** Merged History Encounter **         ROS: Negative.     PE: HEENT: Negative. Lungs: Clear. Cardio: RR.   Assessment/Plan:  Proceed with planned endoscopy.   Douglas Newton [facility] 12/17/2019

## 2019-12-17 NOTE — Op Note (Signed)
[facility] Gastroenterology Patient Name: Douglas Newton Procedure Date: 12/17/2019 8:04 AM MRN: [id number] Account #: [id number] Date of Birth: 1960/11/20 Admit Type: Outpatient Age: 59 Room: [facility] ENDO ROOM 1 Gender: Male Note Status: Finalized Procedure:             Colonoscopy Indications:           High risk colon cancer surveillance: Personal history                         of colonic polyps Providers:             Robert Bellow, MD Referring MD:          Irven Easterly. Kary Kos, MD (Referring MD) Medicines:             Monitored Anesthesia Care Complications:         No immediate complications. Procedure:             Pre-Anesthesia Assessment:                        - Prior to the procedure, a History and Physical was                         performed, and patient medications, allergies and                         sensitivities were reviewed. The patient's tolerance                         of previous anesthesia was reviewed.                        - The risks and benefits of the procedure and the                         sedation options and risks were discussed with the                         patient. All questions were answered and informed                         consent was obtained.                        After obtaining informed consent, the colonoscope was                         passed under direct vision. Throughout the procedure,                         the patient's blood pressure, pulse, and oxygen                         saturations were monitored continuously. The                         Colonoscope was introduced through the anus and                         advanced to the the terminal  ileum. The colonoscopy                         was performed without difficulty. The patient                         tolerated the procedure well. The quality of the bowel                         preparation was excellent. Findings:      A 12 mm polyp was  found in the cecum. The polyp was sessile. The polyp       was removed with a hot snare. Resection and retrieval were complete. To       prevent bleeding post-intervention, one hemostatic clip was successfully       placed (MR conditional). There was no bleeding during, or at the end, of       the procedure.      Four sessile polyps were found in the transverse colon and ascending       colon. The polyps were 5 to 10 mm in size. These polyps were removed       with a hot snare. Resection and retrieval were complete.      The retroflexed view of the distal rectum and anal verge was normal and       showed no anal or rectal abnormalities. Impression:            - One 12 mm polyp in the cecum, removed with a hot                         snare. Resected and retrieved. Clip (MR conditional)                         was placed.                        - Four 5 to 10 mm polyps in the transverse colon and                         in the ascending colon, removed with a hot snare.                         Resected and retrieved.                        - The distal rectum and anal verge are normal on                         retroflexion view. Recommendation:        - Telephone endoscopist for pathology results in 1                         week. Procedure Code(s):     --- Professional ---                        [phone], Colonoscopy, flexible; with removal of                         tumor(s), polyp(s), or other lesion(s) by snare  technique Diagnosis Code(s):     --- Professional ---                        K63.5, Polyp of colon                        Z86.010, Personal history of colonic polyps CPT copyright 2019 American Medical Association. All rights reserved. The codes documented in this report are preliminary and upon coder review may  be revised to meet current compliance requirements. Robert Bellow, MD 12/17/2019 9:05:25 AM This report has been signed  electronically. Number of Addenda: 0 Note Initiated On: 12/17/2019 8:04 AM Scope Withdrawal Time: 0 hours 26 minutes 25 seconds  Total Procedure Duration: 0 hours 33 minutes 22 seconds  Estimated Blood Loss:  Estimated blood loss: none.      [facility]

## 2019-12-17 NOTE — Anesthesia Preprocedure Evaluation (Signed)
Anesthesia Evaluation  Patient identified by MRN, date of birth, ID band Patient awake    Reviewed: Allergy & Precautions, H&P , NPO status , Patient's Chart, lab work & pertinent test results  History of Anesthesia Complications Negative for: history of anesthetic complications  Airway Mallampati: III  TM Distance: >3 FB     Dental  (+) Chipped   Pulmonary neg sleep apnea, neg COPD, Current Smoker and Patient abstained from smoking.,    breath sounds clear to auscultation       Cardiovascular hypertension, (-) angina(-) Past MI and (-) Cardiac Stents + dysrhythmias ("symptomatic bradycardia" in chart - no issues since medications adjusted)  Rhythm:regular Rate:Normal     Neuro/Psych Seizures -,  negative psych ROS   GI/Hepatic Neg liver ROS, GERD  ,  Endo/Other  negative endocrine ROS  Renal/GU negative Renal ROS  negative genitourinary   Musculoskeletal   Abdominal   Peds  Hematology negative hematology ROS (+)   Anesthesia Other Findings Obesity  Past Medical History: 2011: Arthritis No date: Chicken pox 01/15/2012: Colon polyp No date: Diarrhea No date: GERD (gastroesophageal reflux disease) 2007: Hernia     Comment:  admits 2006 No date: Hyperlipemia admits 2011: Hypertension No date: Obesity, unspecified No date: Personal history of tobacco use, presenting hazards to health No date: Screening for obesity 1965: Seizure disorder ([place])     Comment:  admits 1964 No date: Syncope and collapse 1975: Ulcer     Comment:  admits 0370 No date: Umbilical hernia without mention of obstruction or gangrene No date: Ventral hernia, unspecified, without mention of obstruction  or gangrene  Past Surgical History: 1965: ABDOMINAL SURGERY 1969: arm surgery 01/15/2012: COLONOSCOPY     Comment:  Dr Bary Castilla 12/04/2016: COLONOSCOPY WITH PROPOFOL; N/A     Comment:  Procedure: COLONOSCOPY WITH PROPOFOL;  Surgeon:  Robert Bellow, MD;  Location: [facility] ENDOSCOPY;  Service:               Endoscopy;  Laterality: N/A; December 2013: HERNIA REPAIR     Comment:  ventral hernia,umbilical hernia October 1983: KNEE ARTHROSCOPY     Comment:  right knee October 2011: KNEE SURGERY     Comment:  left knee May 1965: LOBECTOMY 1965: stomach tube 1965: tracheotomy 1963: wires removed from under arm  BMI    Body Mass Index: 38.29 kg/m      Reproductive/Obstetrics negative OB ROS                             Anesthesia Physical Anesthesia Plan  ASA: II  Anesthesia Plan: General   Post-op Pain Management:    Induction:   PONV Risk Score and Plan: Propofol infusion and TIVA  Airway Management Planned:   Additional Equipment:   Intra-op Plan:   Post-operative Plan:   Informed Consent: I have reviewed the patients History and Physical, chart, labs and discussed the procedure including the risks, benefits and alternatives for the proposed anesthesia with the patient or authorized representative who has indicated his/her understanding and acceptance.     Dental Advisory Given  Plan Discussed with: Anesthesiologist, CRNA and Surgeon  Anesthesia Plan Comments:         Anesthesia Quick Evaluation

## 2019-12-17 NOTE — Anesthesia Postprocedure Evaluation (Signed)
Anesthesia Post Note  Patient: Douglas Newton  Procedure(s) Performed: COLONOSCOPY WITH PROPOFOL (N/A )  Patient location during evaluation: Endoscopy Anesthesia Type: General Level of consciousness: awake and alert Pain management: pain level controlled Vital Signs Assessment: post-procedure vital signs reviewed and stable Respiratory status: spontaneous breathing, nonlabored ventilation, respiratory function stable and patient connected to nasal cannula oxygen Cardiovascular status: blood pressure returned to baseline and stable Postop Assessment: no apparent nausea or vomiting Anesthetic complications: no   No complications documented.   Last Vitals:  Vitals:   12/17/19 0930 12/17/19 0940  BP: 134/79 (!) 147/75  Pulse: (!) 56 (!) 56  Resp: 14 15  Temp:    SpO2: 100% 100%    Last Pain:  Vitals:   12/17/19 0750  PainSc: 0-No pain                 Martha Clan

## 2019-12-17 NOTE — Anesthesia Procedure Notes (Signed)
Date/Time: 12/17/2019 8:24 AM Performed by: Johnna Acosta, CRNA Pre-anesthesia Checklist: Patient identified, Emergency Drugs available, Suction available, Patient being monitored and Timeout performed Patient Re-evaluated:Patient Re-evaluated prior to induction Oxygen Delivery Method: Nasal cannula Preoxygenation: Pre-oxygenation with 100% oxygen Induction Type: IV induction

## 2019-12-17 NOTE — Transfer of Care (Signed)
Immediate Anesthesia Transfer of Care Note  Patient: Douglas Newton  Procedure(s) Performed: COLONOSCOPY WITH PROPOFOL (N/A )  Patient Location: PACU  Anesthesia Type:General  Level of Consciousness: sedated  Airway & Oxygen Therapy: Patient Spontanous Breathing  Post-op Assessment: Report given to RN and Post -op Vital signs reviewed and stable  Post vital signs: Reviewed and stable  Last Vitals:  Vitals Value Taken Time  BP    Temp    Pulse 70 12/17/19 0908  Resp 14 12/17/19 0908  SpO2 99 % 12/17/19 0908    Last Pain:  Vitals:   12/17/19 0750  PainSc: 0-No pain         Complications: No complications documented.

## 2019-12-20 LAB — SURGICAL PATHOLOGY

## 2020-10-14 IMAGING — CR DG CHEST 1V
1 series · 1 of 1 positions shown · non-contrast
Comparison: None.

CLINICAL DATA: Fall, trauma

EXAM:
CHEST  1 VIEW

[chest ap]
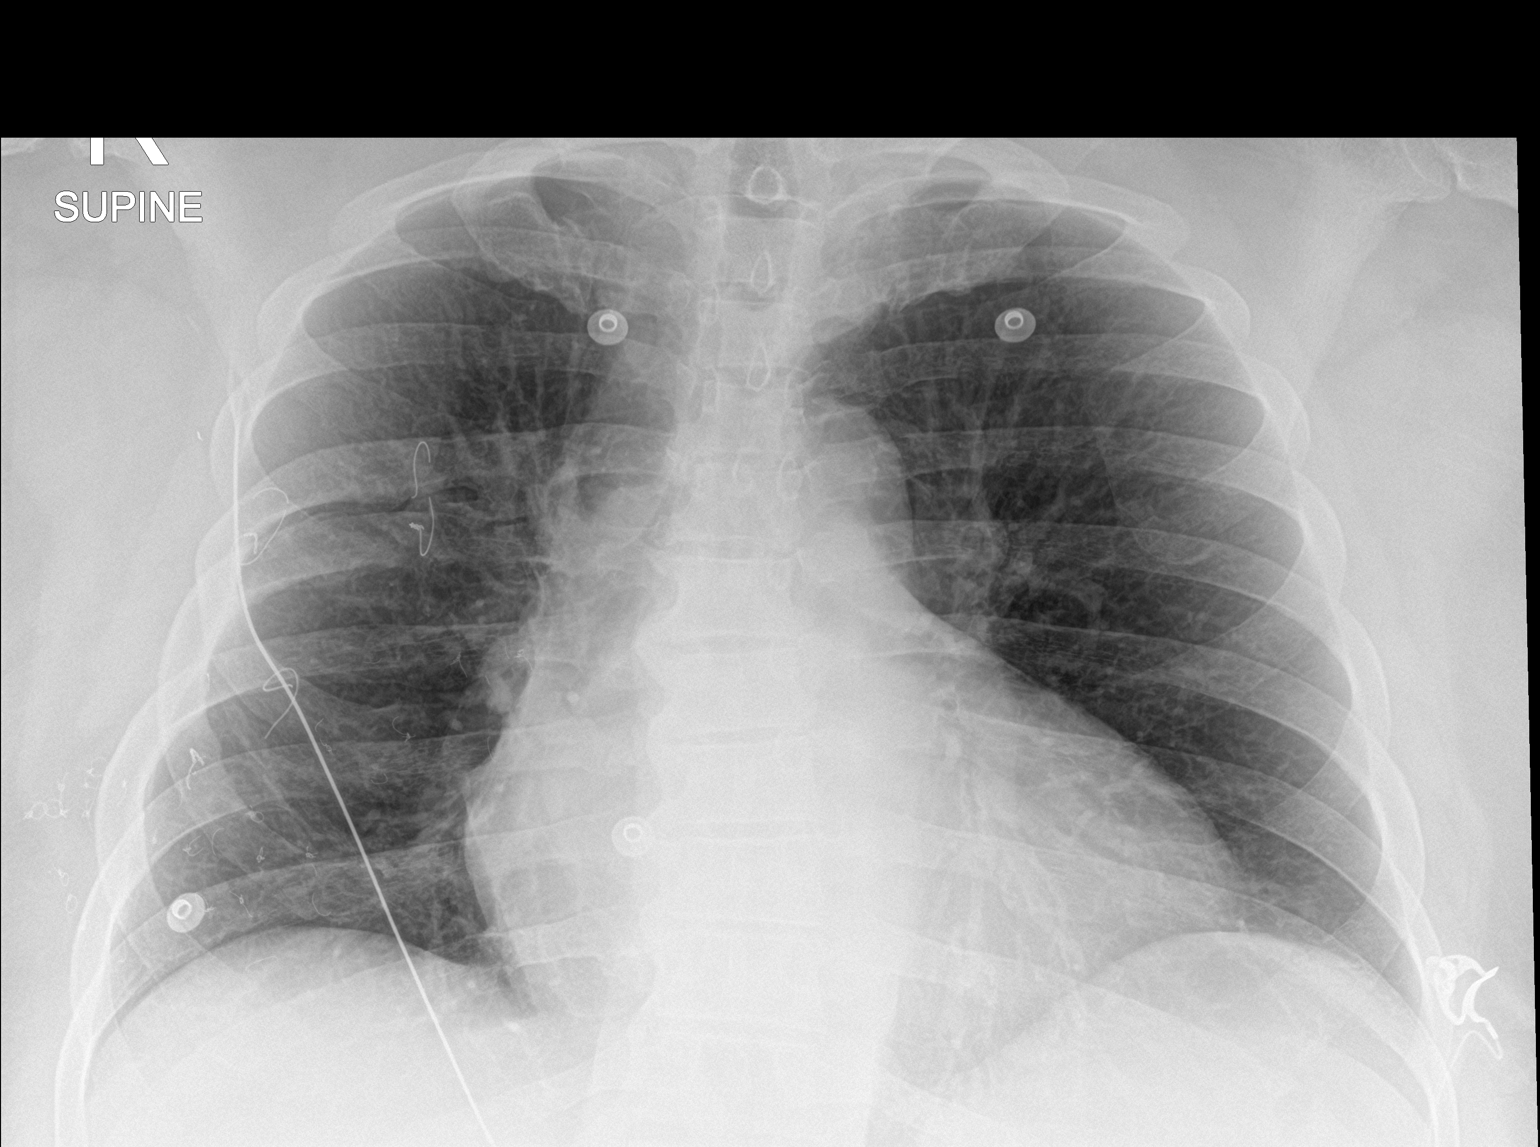

[1 of 1 positions shown; findings below may reference images not displayed]

FINDINGS: Remote appearing right chest wall deformity with a healed fracture
of the right sixth rib posteriorly. Adjacent surgical material
likely reflecting change from prior thoracotomy and lobectomy.
Additional postsurgical change about the hilum. No consolidation,
features of edema, pneumothorax, or effusion. Mild cardiomegaly with
a calcified aorta. No acute osseous or soft tissue abnormality.
IMPRESSION: Postsurgical changes in the right chest wall and hilum likely
related to prior thoracotomy and lobectomy.

No acute cardiopulmonary abnormality.

Mild cardiomegaly.

Aortic Atherosclerosis ([id number]).

## 2020-10-14 IMAGING — CT CT CERVICAL SPINE W/O CM
3 of 4 series · 12 of 33 positions shown, 14 images · non-contrast
Comparison: None.

CLINICAL DATA: Syncope, possible head strike

EXAM:
CT HEAD WITHOUT CONTRAST
CT CERVICAL SPINE WITHOUT CONTRAST
TECHNIQUE: Multidetector CT imaging of the head and cervical spine was
performed following the standard protocol without intravenous
contrast. Multiplanar CT image reconstructions of the cervical spine
were also generated.

[Series 4: sagittal bone · sagittal · 0.24mm/px · 5 of 65 slices shown, 6 images]
[im 22/65  bone]
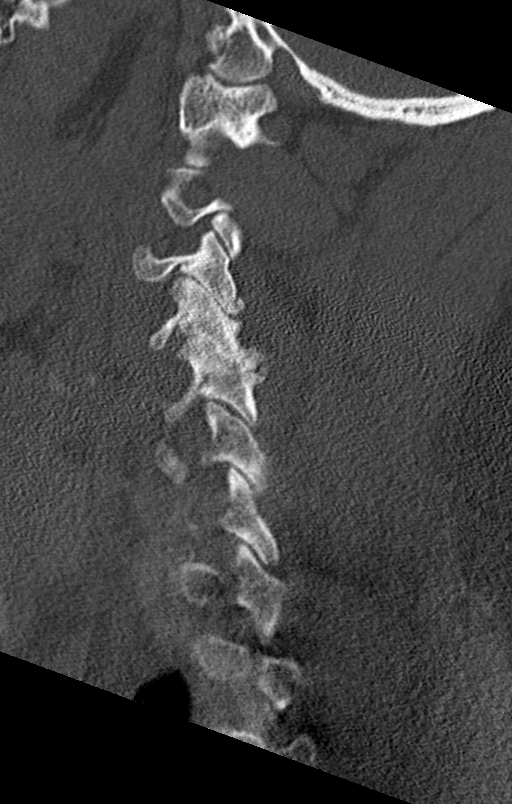
[im 27/65  bone]
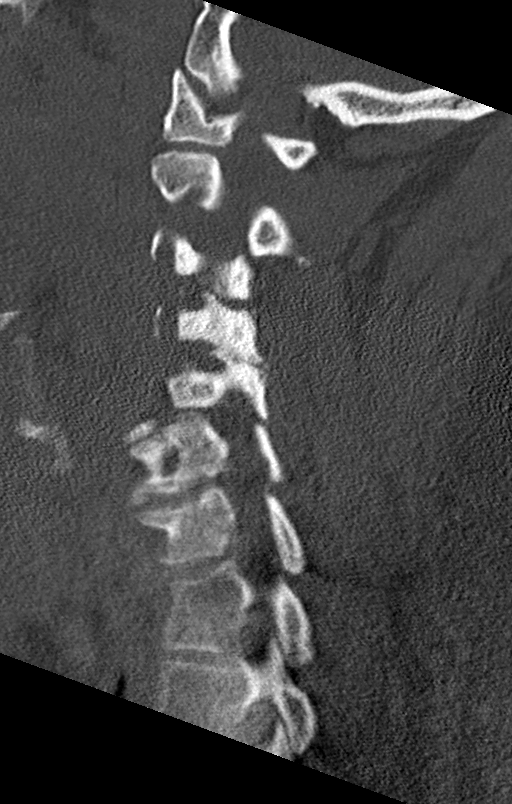
[im 33/65  soft-tissue]
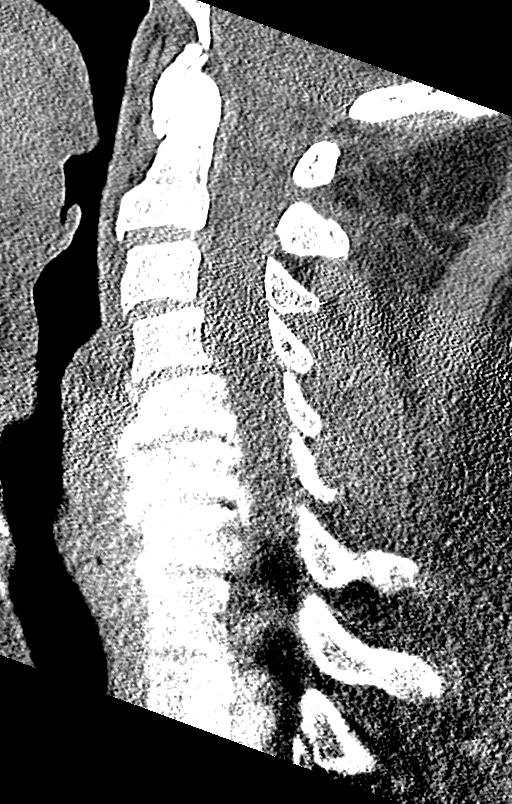
[im 33/65  bone]
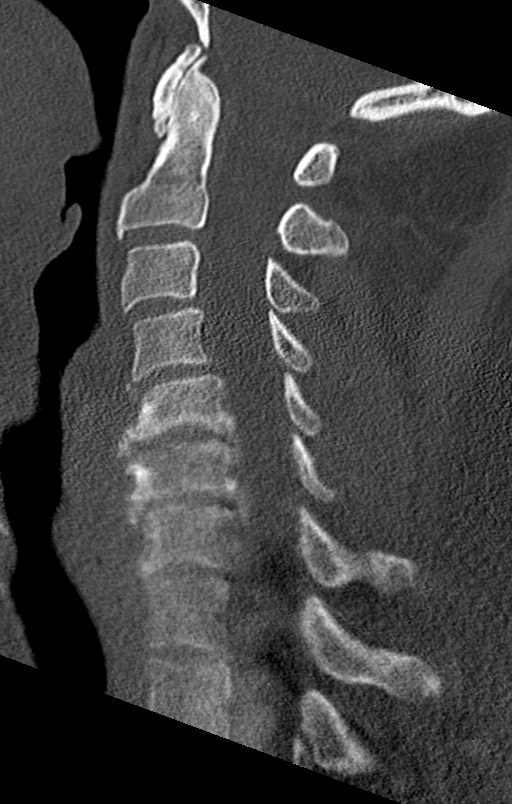
[im 38/65  bone]
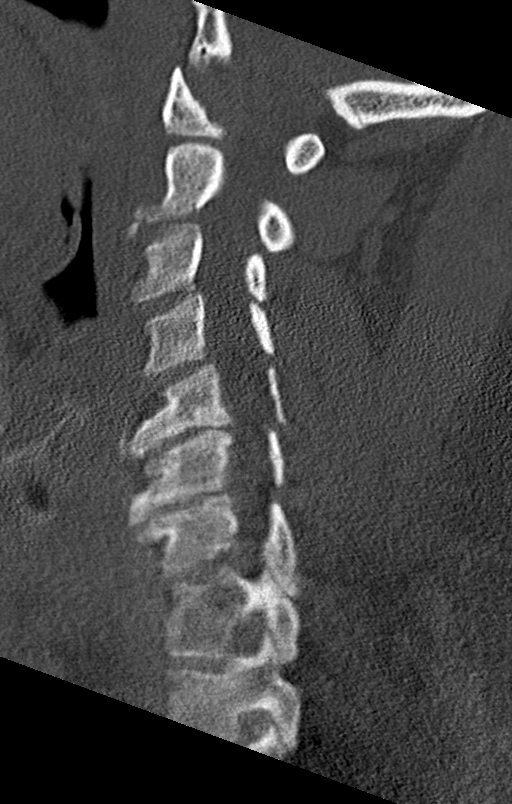
[im 43/65  bone]
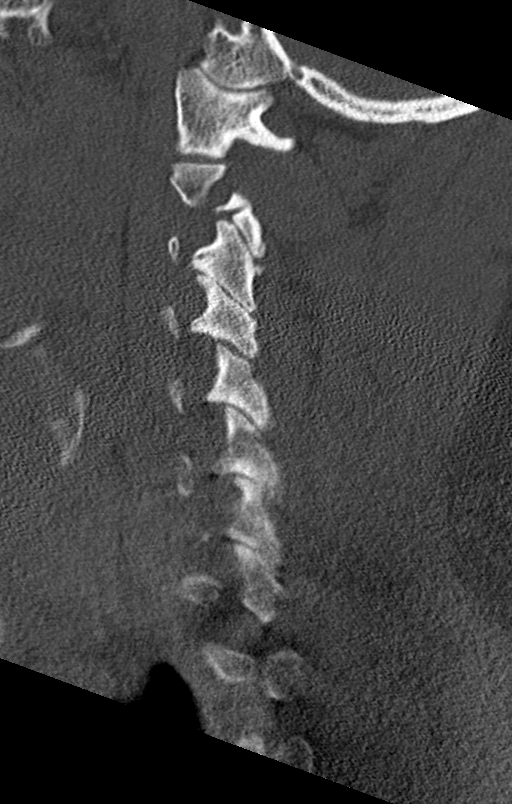

[Series 5: coronal bone · coronal · 0.28mm/px · 3 of 52 slices shown]
[im 11/52  bone]
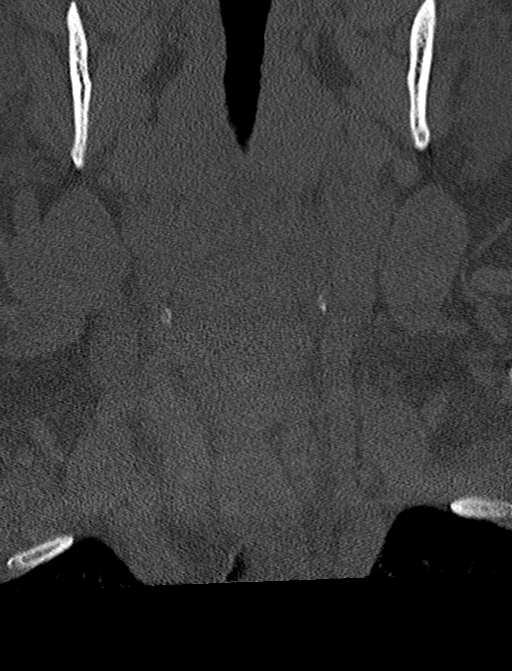
[im 21/52  bone]
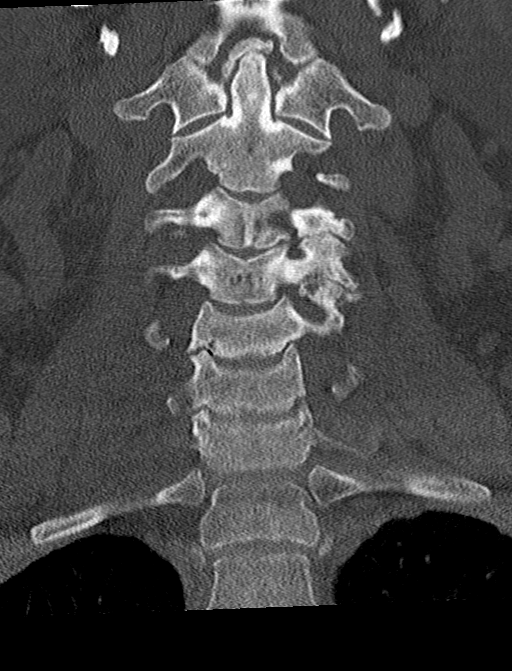
[im 31/52  bone]
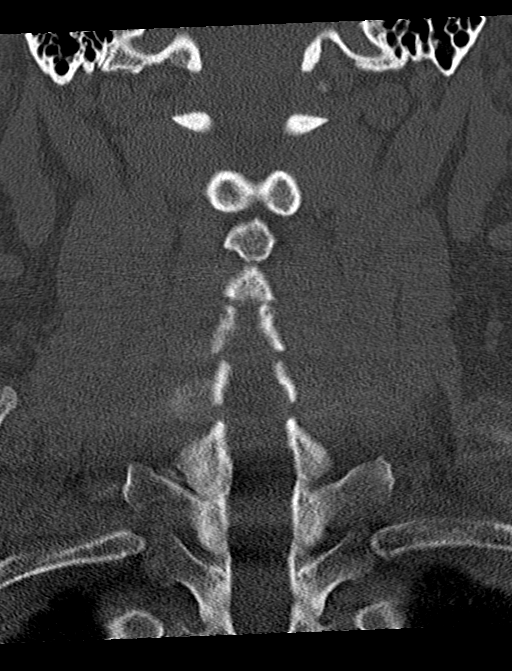

[Series 6: orthogonal bone · axial · 0.24mm/px · z∈[-319,-192]mm · 4 of 96 slices shown, 5 images]
[im 14/96  soft-tissue]
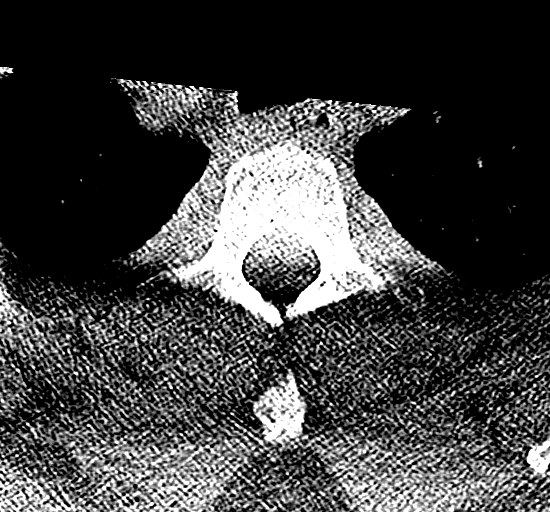
[im 14/96  bone]
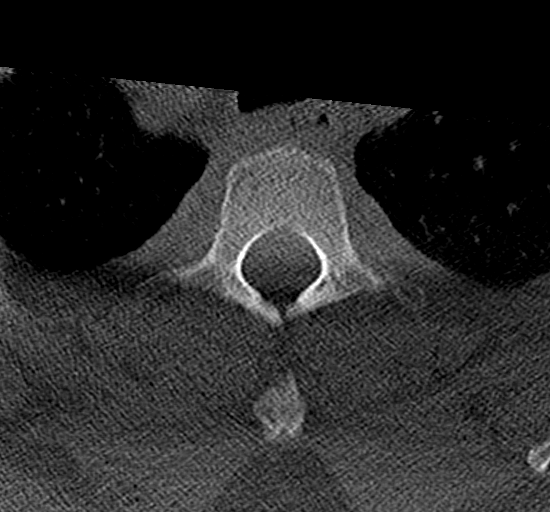
[im 41/96  bone]
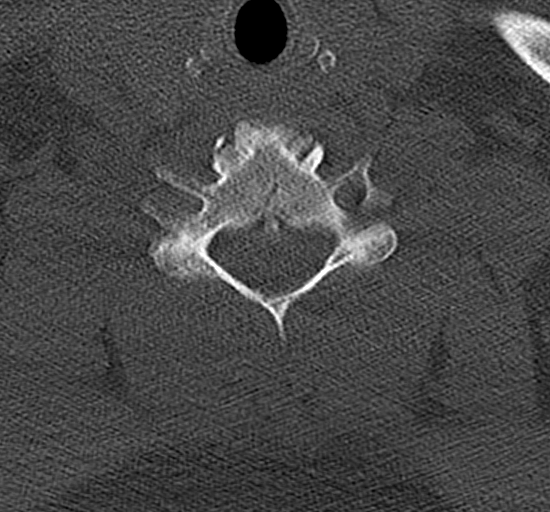
[im 55/96  bone]
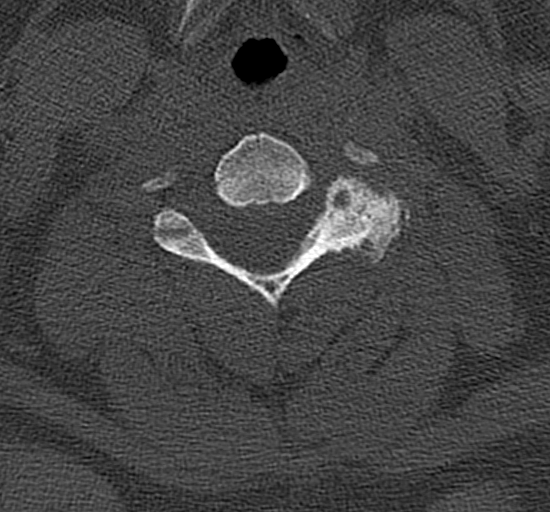
[im 82/96  bone]
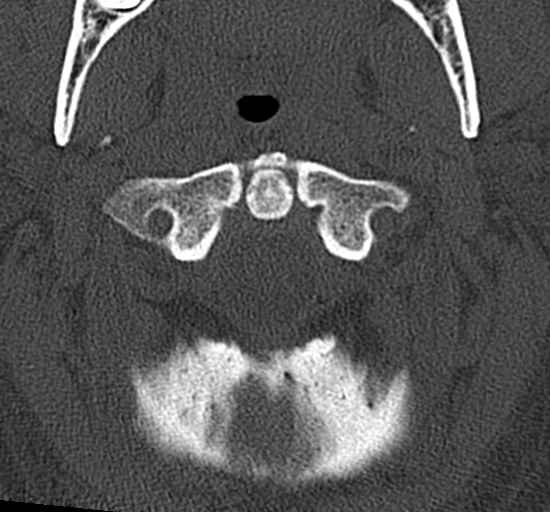

[12 of 33 positions shown; findings below may reference images not displayed]

FINDINGS: CT HEAD FINDINGS

Brain: No evidence of acute infarction, hemorrhage, hydrocephalus,
extra-axial collection or mass lesion/mass effect.

Vascular: No hyperdense vessel or unexpected calcification.

Skull: Mild left temporal scalp thickening without large hematoma.
No calvarial fracture or suspicious osseous lesion.

Sinuses/Orbits: Minimal mural thickening in the maxillary sinuses.
Remaining paranasal sinuses and mastoids are predominantly clear.
Included orbital structures are unremarkable.

Other: None

CT CERVICAL SPINE FINDINGS

Alignment: Slight reversal of the lower cervical lordosis with
likely degenerative stepwise anterolisthesis C3-C6. No convincing
traumatic listhesis in the absence of abnormally widened, perched or
jumped facets. Craniocervical and atlantoaxial articulations are
maintained.

Skull base and vertebrae: No acute fracture. No primary bone lesion
or focal pathologic process.

Soft tissues and spinal canal: No pre or paravertebral fluid or
swelling. No visible canal hematoma.

Disc levels: Multilevel intervertebral disc height loss with
spondylitic endplate changes. Features most pronounced C5-6, C6-7
with at least mild canal stenosis at these levels. Uncinate spurring
and facet hypertrophic changes are also new, mild-to-moderate at the
aforementioned levels and moderate to severe on the left at C4-5.

Upper chest: No acute abnormality in the upper chest or imaged lung
apices.

Other: Normal thyroid.
IMPRESSION: 1. Mild left temporal scalp thickening without large hematoma. No
calvarial fracture.
2. No evidence of acute intracranial findings.
3. No evidence of acute fracture or listhesis involving the cervical
spine.
4. Multilevel spondylitic and facet degenerative changes in the
cervical spine as detailed above.

## 2023-01-11 ENCOUNTER — Encounter: Payer: Self-pay | Admitting: Intensive Care

## 2023-01-11 ENCOUNTER — Emergency Department: Payer: Managed Care, Other (non HMO)

## 2023-01-11 ENCOUNTER — Other Ambulatory Visit: Payer: Self-pay

## 2023-01-11 ENCOUNTER — Emergency Department
Admission: EM | Admit: 2023-01-11 | Discharge: 2023-01-11 | Disposition: A | Discharge status: Home / Self Care | Payer: Managed Care, Other (non HMO) | Attending: Emergency Medicine | Admitting: Emergency Medicine

## 2023-01-11 DIAGNOSIS — R42 Dizziness and giddiness: Secondary | ICD-10-CM | POA: Diagnosis not present

## 2023-01-11 DIAGNOSIS — R112 Nausea with vomiting, unspecified: Secondary | ICD-10-CM | POA: Insufficient documentation

## 2023-01-11 DIAGNOSIS — R1110 Vomiting, unspecified: Secondary | ICD-10-CM | POA: Diagnosis present

## 2023-01-11 DIAGNOSIS — R197 Diarrhea, unspecified: Secondary | ICD-10-CM | POA: Diagnosis not present

## 2023-01-11 LAB — BASIC METABOLIC PANEL
Anion gap: 5 (ref 5–15)
BUN: 17 mg/dL (ref 8–23)
CO2: 25 mmol/L (ref 22–32)
Calcium: 8.7 mg/dL — ABNORMAL LOW (ref 8.9–10.3)
Chloride: 106 mmol/L (ref 98–111)
Creatinine, Ser: 0.7 mg/dL (ref 0.61–1.24)
GFR, Estimated: >60 mL/min (ref >60)
Glucose, Bld: 110 mg/dL — ABNORMAL HIGH (ref 70–99)
Potassium: 4.2 mmol/L (ref 3.5–5.1)
Sodium: 136 mmol/L (ref 135–145)

## 2023-01-11 LAB — URINALYSIS, ROUTINE W REFLEX MICROSCOPIC
Bilirubin Urine: NEGATIVE
Glucose, UA: NEGATIVE mg/dL
Hgb urine dipstick: NEGATIVE
Ketones, ur: 5 mg/dL — AB
Leukocytes,Ua: NEGATIVE
Nitrite: NEGATIVE
Protein, ur: 30 mg/dL — AB
Specific Gravity, Urine: 1.029 (ref 1.005–1.030)
pH: 5 (ref 5.0–8.0)

## 2023-01-11 LAB — CBC
HCT: 45.6 % (ref 39.0–52.0)
Hemoglobin: 15.5 g/dL (ref 13.0–17.0)
MCH: 29.9 pg (ref 26.0–34.0)
MCHC: 34 g/dL (ref 30.0–36.0)
MCV: 88 fL (ref 80.0–100.0)
Platelets: 249 10*3/uL (ref 150–400)
RBC: 5.18 MIL/uL (ref 4.22–5.81)
RDW: 12.7 % (ref 11.5–15.5)
WBC: 8.7 10*3/uL (ref 4.0–10.5)
nRBC: 0 % (ref 0.0–0.2)

## 2023-01-11 LAB — HEPATIC FUNCTION PANEL
ALT: 21 U/L (ref 0–44)
AST: 25 U/L (ref 15–41)
Albumin: 3.7 g/dL (ref 3.5–5.0)
Alkaline Phosphatase: 89 U/L (ref 38–126)
Bilirubin, Direct: 0.2 mg/dL (ref 0.0–0.2)
Indirect Bilirubin: 0.3 mg/dL (ref 0.3–0.9)
Total Bilirubin: 0.5 mg/dL (ref <1.2)
Total Protein: 6.8 g/dL (ref 6.5–8.1)

## 2023-01-11 LAB — LIPASE, BLOOD: Lipase: 26 U/L (ref 11–51)

## 2023-01-11 MED ORDER — ONDANSETRON HCL 4 MG/2ML IJ SOLN
4.0000 mg | Freq: Once | INTRAMUSCULAR | Status: AC
  Administered 2023-01-11: 4 mg via INTRAVENOUS
  Filled 2023-01-11: qty 2

## 2023-01-11 MED ORDER — ONDANSETRON HCL 4 MG PO TABS: 4.0000 mg | ORAL_TABLET | Freq: Four times a day (QID) | ORAL | 0 refills | Status: AC | PRN

## 2023-01-11 MED ORDER — HYDROCODONE-ACETAMINOPHEN 5-325 MG PO TABS
1.0000 | ORAL_TABLET | Freq: Once | ORAL | Status: AC
Start: 2023-01-11 — End: 2023-01-11
  Administered 2023-01-11: 1 via ORAL
  Filled 2023-01-11: qty 1

## 2023-01-11 MED ORDER — MECLIZINE HCL 25 MG PO TABS: 25.0000 mg | ORAL_TABLET | Freq: Three times a day (TID) | ORAL | 0 refills | Status: DC | PRN

## 2023-01-11 MED ORDER — MECLIZINE HCL 25 MG PO TABS
25.0000 mg | ORAL_TABLET | Freq: Once | ORAL | Status: AC
  Administered 2023-01-11: 25 mg via ORAL
  Filled 2023-01-11: qty 1

## 2023-01-11 MED ORDER — SODIUM CHLORIDE 0.9 % IV BOLUS
1000.0000 mL | Freq: Once | INTRAVENOUS | Status: AC
  Administered 2023-01-11: 1000 mL via INTRAVENOUS

## 2023-01-11 NOTE — ED Provider Notes (Signed)
[facility] Provider Note    Event Date/Time   First MD Initiated Contact with Patient 01/11/23 1644     (approximate)   History   Emesis   HPI  Douglas Newton is a 62 year old presenting with department for evaluation of vomiting and diarrhea.  Patient ate some cheese cake that he think may have been spoiled and since then has had multiple episodes of nonbloody vomiting and diarrhea.  Did have a syncopal episode yesterday immediately after standing up.  Unknown head strike.  Reported head and neck pain as well as abdominal pain in triage.  On my evaluation, patient's primary complaints are nausea and dizziness.  Does describe some of his dizziness as a spinning sensation.    Physical Exam   Triage Vital Signs: ED Triage Vitals  Encounter Vitals Group     BP 01/11/23 1451 (!) 152/85     Systolic BP Percentile --      Diastolic BP Percentile --      Pulse Rate 01/11/23 1451 66     Resp 01/11/23 1451 16     Temp 01/11/23 1451 98.2 F (36.8 C)     Temp Source 01/11/23 1451 Oral     SpO2 01/11/23 1451 99 %     Weight 01/11/23 1452 240 lb (108.9 kg)     Height 01/11/23 1452 5\' 6"  (1.676 m)     Head Circumference --      Peak Flow --      Pain Score 01/11/23 1451 4     Pain Loc --      Pain Education --      Exclude from Growth Chart --     Most recent vital signs: Vitals:   01/11/23 1710 01/11/23 2001  BP: (!) 164/80 (!) 160/78  Pulse:  70  Resp:  18  Temp:  98.3 F (36.8 C)  SpO2:  98%     General: Awake, interactive  CV:  Regular rate, good peripheral perfusion.  Resp:  Unlabored respirations, lungs clear to auscultation Abd:  Nondistended, soft, mild tenderness over the lower abdomen without rebound or guarding  Neuro:  Symmetric facial movement, fluid speech, symmetric strength in bilateral upper and lower extremities, moving spontaneously and equally   ED Results / Procedures / Treatments   Labs (all labs ordered are  listed, but only abnormal results are displayed) Labs Reviewed  BASIC METABOLIC PANEL - Abnormal; Notable for the following components:      Result Value   Glucose, Bld 110 (*)    Calcium 8.7 (*)    All other components within normal limits  URINALYSIS, ROUTINE W REFLEX MICROSCOPIC - Abnormal; Notable for the following components:   Color, Urine AMBER (*)    APPearance HAZY (*)    Ketones, ur 5 (*)    Protein, ur 30 (*)    Bacteria, UA RARE (*)    All other components within normal limits  CBC  HEPATIC FUNCTION PANEL  LIPASE, BLOOD     EKG EKG independently reviewed interpreted by myself (ER attending) demonstrates:  EKG demonstrates normal sinus rhythm rate of 69, PR 162, QRS 90, QTc 458, ST depressions noted in multiple leads, similar to prior EKG from 2021  RADIOLOGY Imaging independently reviewed and interpreted by myself demonstrates:  CT head without acute bleed CT C-spine without acute fracture  PROCEDURES:  Critical Care performed: No  Procedures   MEDICATIONS ORDERED IN ED: Medications  sodium chloride 0.9 % bolus 1,000  mL (0 mLs Intravenous Stopped 01/11/23 2002)  ondansetron (ZOFRAN) injection 4 mg (4 mg Intravenous Given 01/11/23 1705)  HYDROcodone-acetaminophen (NORCO/VICODIN) 5-325 MG per tablet 1 tablet (1 tablet Oral Given 01/11/23 1705)  meclizine (ANTIVERT) tablet 25 mg (25 mg Oral Given 01/11/23 2006)     IMPRESSION / MDM / ASSESSMENT AND PLAN / ED COURSE  I reviewed the triage vital signs and the nursing notes.  Differential diagnosis includes, but is not limited to, viral illness, foodborne illness, low suspicion appendicitis, biliary pathology, other acute intra-abdominal process given overall reassuring abdominal exam and history, suspect possible component of dehydration, regarding syncope, suspect likely vasovagal syncope with clinical history, consideration for arrhythmia, anemia, electrolyte abnormality, possible vertigo in the setting of  repeated vomiting  Patient's presentation is most consistent with acute presentation with potential threat to life or bodily function.  62 year old male presenting to the emergency department for evaluation of vomiting and diarrhea.  Vital stable on presentation.  Labs reassuring.  Urine without evidence of infection.  Patient treated symptomatically here with IV fluids, Zofran, Norco.  Reported significant improvement in nausea and pain.  Did report some ongoing dizziness described as spinning sensation.  Suspect patient may have developed vertigo in the setting of his repeated vomiting.  Given meclizine with improvement.  He is comfortable with discharge home.  Will DC with prescriptions for Zofran and meclizine.  Strict return precautions provided.  Patient discharged stable condition.      FINAL CLINICAL IMPRESSION(S) / ED DIAGNOSES   Final diagnoses:  Nausea vomiting and diarrhea  Dizziness     Rx / DC Orders   ED Discharge Orders          Ordered    ondansetron (ZOFRAN) 4 MG tablet  Every 6 hours PRN        01/11/23 2044    meclizine (ANTIVERT) 25 MG tablet  3 times daily PRN        01/11/23 2044             Note:  This document was prepared using Dragon voice recognition software and may include unintentional dictation errors.   Trinna Post, MD 01/11/23 2045

## 2023-01-11 NOTE — ED Triage Notes (Signed)
Patient presents with N/V/D that started last night around 11pm. Reports he had syncopal episode last night from standing and woke up on the floor. C/o abdominal pain and head/neck pain.

## 2023-01-11 NOTE — ED Triage Notes (Signed)
First nurse note: pt to ED [facility] from home  for n/v started last night. Reports LOC yesterday. Denies hitting head or injuries. No blood thinners.  20g to L AC. 4mg  zofran PTA

## 2023-01-11 NOTE — Discharge Instructions (Signed)
You were seen in the emergency department today for evaluation of your diarrhea.  Your testing was fortunately reassuring.  I suspect you likely have a GI bug.  Please try and stay hydrated is much as possible.  I sent a prescription for nausea medicine to your pharmacy.  Have also sent a prescription for meclizine for your dizziness.  Follow-up your primary care doctor for further evaluation.  Return to the ER for new or worsening symptoms include worsening abdominal pain, inability tolerate food or liquids despite medication, severe headache, or any other new or concerning symptoms.

## 2023-01-11 NOTE — ED Provider Triage Note (Signed)
Emergency Medicine Provider Triage Evaluation Note  Douglas Newton , a 62 y.o. male  was evaluated in triage.  Pt complains of v/d, neck pain after syncopal episode.  Review of Systems  Positive:  Negative:   Physical Exam  BP (!) 152/85 (BP Location: Right Arm)   Pulse 66   Temp 98.2 F (36.8 C) (Oral)   Resp 16   Ht 5\' 6"  (1.676 m)   Wt 108.9 kg   SpO2 99%   BMI 38.74 kg/m  Gen:   Awake, no distress   Resp:  Normal effort  MSK:   Moves extremities without difficulty  Other:    Medical Decision Making  Medically screening exam initiated at 2:53 PM.  Appropriate orders placed.  Ursula Beath was informed that the remainder of the evaluation will be completed by another provider, this initial triage assessment does not replace that evaluation, and the importance of remaining in the ED until their evaluation is complete.     Faythe Ghee, PA-C 01/11/23 1453

## 2023-01-29 DIAGNOSIS — M1712 Unilateral primary osteoarthritis, left knee: Secondary | ICD-10-CM

## 2023-01-29 HISTORY — DX: Unilateral primary osteoarthritis, left knee: M17.12

## 2023-03-28 ENCOUNTER — Encounter: Payer: Self-pay | Admitting: Gastroenterology

## 2023-03-28 ENCOUNTER — Ambulatory Visit: Payer: Managed Care, Other (non HMO) | Admitting: Registered Nurse

## 2023-03-28 ENCOUNTER — Ambulatory Visit
Admission: RE | Admit: 2023-03-28 | Discharge: 2023-03-28 | Disposition: A | Discharge status: Home / Self Care | Payer: Managed Care, Other (non HMO) | Attending: Gastroenterology | Admitting: Gastroenterology

## 2023-03-28 ENCOUNTER — Encounter
Admission: RE | Disposition: A | Discharge status: Home / Self Care | Payer: Self-pay | Source: Home / Self Care | Attending: Gastroenterology

## 2023-03-28 DIAGNOSIS — D120 Benign neoplasm of cecum: Secondary | ICD-10-CM | POA: Diagnosis not present

## 2023-03-28 DIAGNOSIS — I10 Essential (primary) hypertension: Secondary | ICD-10-CM | POA: Insufficient documentation

## 2023-03-28 DIAGNOSIS — K219 Gastro-esophageal reflux disease without esophagitis: Secondary | ICD-10-CM | POA: Diagnosis not present

## 2023-03-28 DIAGNOSIS — D123 Benign neoplasm of transverse colon: Secondary | ICD-10-CM | POA: Insufficient documentation

## 2023-03-28 DIAGNOSIS — Z1211 Encounter for screening for malignant neoplasm of colon: Secondary | ICD-10-CM | POA: Insufficient documentation

## 2023-03-28 DIAGNOSIS — Z860101 Personal history of adenomatous and serrated colon polyps: Secondary | ICD-10-CM | POA: Diagnosis present

## 2023-03-28 DIAGNOSIS — Z79899 Other long term (current) drug therapy: Secondary | ICD-10-CM | POA: Insufficient documentation

## 2023-03-28 DIAGNOSIS — D124 Benign neoplasm of descending colon: Secondary | ICD-10-CM | POA: Insufficient documentation

## 2023-03-28 HISTORY — PX: POLYPECTOMY: SHX5525

## 2023-03-28 HISTORY — PX: COLONOSCOPY: SHX5424

## 2023-03-28 SURGERY — COLONOSCOPY
Anesthesia: General

## 2023-03-28 MED ORDER — SODIUM CHLORIDE 0.9 % IV SOLN: INTRAVENOUS | Status: DC

## 2023-03-28 MED ORDER — PROPOFOL 10 MG/ML IV BOLUS
INTRAVENOUS | Status: DC | PRN
  Administered 2023-03-28: 50 mg via INTRAVENOUS
  Administered 2023-03-28: 100 ug/kg/min via INTRAVENOUS
  Administered 2023-03-28: 50 mg via INTRAVENOUS

## 2023-03-28 MED ORDER — LIDOCAINE HCL (CARDIAC) PF 100 MG/5ML IV SOSY
PREFILLED_SYRINGE | INTRAVENOUS | Status: DC | PRN
  Administered 2023-03-28: 60 mg via INTRAVENOUS

## 2023-03-28 NOTE — Interval H&P Note (Signed)
 History and Physical Interval Note: Preprocedure H&P from 03/28/23  was reviewed and there was no interval change after seeing and examining the patient.  Written consent was obtained from the patient after discussion of risks, benefits, and alternatives. Patient has consented to proceed with Colonoscopy with possible intervention   03/28/2023 12:32 PM  Douglas Newton  has presented today for surgery, with the diagnosis of Personal history of adenomatous and serrated colon polyps (Z86.0101).  The various methods of treatment have been discussed with the patient and family. After consideration of risks, benefits and other options for treatment, the patient has consented to  Procedure(s): COLONOSCOPY (N/A) as a surgical intervention.  The patient's history has been reviewed, patient examined, no change in status, stable for surgery.  I have reviewed the patient's chart and labs.  Questions were answered to the patient's satisfaction.     Jaynie Collins

## 2023-03-28 NOTE — Op Note (Signed)
 [facility] Gastroenterology Patient Name: Douglas Newton Procedure Date: 03/28/2023 11:50 AM MRN: [id number] Account #: [id number] Date of Birth: 03/09/1960 Admit Type: Outpatient Age: 63 Room: [facility] ENDO ROOM 1 Gender: Male Note Status: Finalized Instrument Name: Nelda Marseille [id number] Procedure:             Colonoscopy Indications:           High risk colon cancer surveillance: Personal history                         of adenoma (10 mm or greater in size), High risk colon                         cancer surveillance: Personal history of adenoma with                         villous component Providers:             Trenda Moots, DO Referring MD:          Jaynie Collins DO, DO (Referring MD), Rhona Leavens.                         Burnett Sheng, MD (Referring MD) Medicines:             Monitored Anesthesia Care Complications:         No immediate complications. Estimated blood loss:                         Minimal. Procedure:             Pre-Anesthesia Assessment:                        - Prior to the procedure, a History and Physical was                         performed, and patient medications and allergies were                         reviewed. The patient is competent. The risks and                         benefits of the procedure and the sedation options and                         risks were discussed with the patient. All questions                         were answered and informed consent was obtained.                         Patient identification and proposed procedure were                         verified by the physician, the nurse, the anesthetist                         and the technician in the endoscopy suite. Mental  Status Examination: alert and oriented. Airway                         Examination: normal oropharyngeal airway and neck                         mobility. Respiratory Examination: clear to                          auscultation. CV Examination: RRR, no murmurs, no S3                         or S4. Prophylactic Antibiotics: The patient does not                         require prophylactic antibiotics. Prior                         Anticoagulants: The patient has taken no anticoagulant                         or antiplatelet agents. ASA Grade Assessment: III - A                         patient with severe systemic disease. After reviewing                         the risks and benefits, the patient was deemed in                         satisfactory condition to undergo the procedure. The                         anesthesia plan was to use monitored anesthesia care                         (MAC). Immediately prior to administration of                         medications, the patient was re-assessed for adequacy                         to receive sedatives. The heart rate, respiratory                         rate, oxygen saturations, blood pressure, adequacy of                         pulmonary ventilation, and response to care were                         monitored throughout the procedure. The physical                         status of the patient was re-assessed after the                         procedure.  After obtaining informed consent, the colonoscope was                         passed under direct vision. Throughout the procedure,                         the patient's blood pressure, pulse, and oxygen                         saturations were monitored continuously. The                         Colonoscope was introduced through the anus and                         advanced to the the cecum, identified by appendiceal                         orifice and ileocecal valve. The colonoscopy was                         performed without difficulty. The patient tolerated                         the procedure well. The quality of the bowel                         preparation was  evaluated using the BBPS [facility] Bowel                         Preparation Scale) with scores of: Right Colon = 3,                         Transverse Colon = 3 and Left Colon = 3 (entire mucosa                         seen well with no residual staining, small fragments                         of stool or opaque liquid). The total BBPS score                         equals 9. The ileocecal valve, appendiceal orifice,                         and rectum were photographed. Findings:      The perianal and digital rectal examinations were normal. Pertinent       negatives include normal sphincter tone.      Two sessile polyps were found in the transverse colon and cecum. The       polyps were 1 to 2 mm in size. These polyps were removed with a jumbo       cold forceps. Resection and retrieval were complete. Estimated blood       loss was minimal.      Three sessile polyps were found in the descending colon and transverse       colon. The polyps were 3 to 6 mm in size. These  polyps were removed with       a cold snare. Resection and retrieval were complete. Estimated blood       loss was minimal.      The exam was otherwise without abnormality on direct and retroflexion       views. Impression:            - Two 1 to 2 mm polyps in the transverse colon and in                         the cecum, removed with a jumbo cold forceps. Resected                         and retrieved.                        - Three 3 to 6 mm polyps in the descending colon and                         in the transverse colon, removed with a cold snare.                         Resected and retrieved.                        - The examination was otherwise normal on direct and                         retroflexion views. Recommendation:        - Patient has a contact number available for                         emergencies. The signs and symptoms of potential                         delayed complications were discussed with  the patient.                         Return to normal activities tomorrow. Written                         discharge instructions were provided to the patient.                        - Discharge patient to home.                        - Resume previous diet.                        - No ibuprofen, naproxen, or other non-steroidal                         anti-inflammatory drugs for 5 days after polyp removal.                        - Continue present medications.                        - Await pathology results.                        -  Repeat colonoscopy for surveillance based on                         pathology results.                        - Return to referring physician as previously                         scheduled.                        - The findings and recommendations were discussed with                         the patient. Procedure Code(s):     --- Professional ---                        [phone], Colonoscopy, flexible; with removal of                         tumor(s), polyp(s), or other lesion(s) by snare                         technique                        [id number], 59, Colonoscopy, flexible; with biopsy, single                         or multiple Diagnosis Code(s):     --- Professional ---                        D12.0, Benign neoplasm of cecum                        D12.4, Benign neoplasm of descending colon                        D12.3, Benign neoplasm of transverse colon (hepatic                         flexure or splenic flexure)                        Z86.010, Personal history of colonic polyps CPT copyright 2022 American Medical Association. All rights reserved. The codes documented in this report are preliminary and upon coder review may  be revised to meet current compliance requirements. Attending Participation:      I personally performed the entire procedure. Elfredia Nevins, DO Jaynie Collins DO, DO 03/28/2023 1:22:16 PM This report has been signed  electronically. Number of Addenda: 0 Note Initiated On: 03/28/2023 11:50 AM Scope Withdrawal Time: 0 hours 24 minutes 20 seconds  Total Procedure Duration: 0 hours 27 minutes 41 seconds  Estimated Blood Loss:  Estimated blood loss was minimal.      [facility]

## 2023-03-28 NOTE — Anesthesia Preprocedure Evaluation (Signed)
 Anesthesia Evaluation  Patient identified by MRN, date of birth, ID band Patient awake    Reviewed: Allergy & Precautions, NPO status , Patient's Chart, lab work & pertinent test results  History of Anesthesia Complications Negative for: history of anesthetic complications  Airway Mallampati: III  TM Distance: >3 FB Neck ROM: full    Dental no notable dental hx.    Pulmonary neg pulmonary ROS, Current Smoker   Pulmonary exam normal        Cardiovascular hypertension, On Medications negative cardio ROS Normal cardiovascular exam  Echo 11/2022 CONCLUSION -------------------------------------------------------------------------------  NORMAL LEFT VENTRICULAR SYSTOLIC FUNCTION WITH SEVERE LVH  ESTIMATED EF: >55%  NORMAL LA PRESSURES WITH DIASTOLIC DYSFUNCTION (GRADE 1)  NORMAL RIGHT VENTRICULAR SYSTOLIC FUNCTION  VALVULAR REGURGITATION: No AR, MILD MR, MILD PR, TRIVIAL TR  VALVULAR STENOSIS: TRIVIAL AS, No MS, No PS, No TS     Neuro/Psych Seizures -, Well Controlled,   negative psych ROS   GI/Hepatic Neg liver ROS,GERD  Medicated,,  Endo/Other  negative endocrine ROS    Renal/GU negative Renal ROS  negative genitourinary   Musculoskeletal   Abdominal   Peds  Hematology negative hematology ROS (+)   Anesthesia Other Findings Past Medical History: 2011: Arthritis No date: Chicken pox 01/15/2012: Colon polyp No date: Diarrhea No date: GERD (gastroesophageal reflux disease) 2007: Hernia     Comment:  admits 2006 No date: Hyperlipemia admits 2011: Hypertension No date: Obesity, unspecified No date: Personal history of tobacco use, presenting hazards to health No date: Screening for obesity 1965: Seizure disorder (HCC)     Comment:  admits 1964 No date: Syncope and collapse 1975: Ulcer     Comment:  admits 1974 No date: Umbilical hernia without mention of obstruction or gangrene No date: Ventral hernia,  unspecified, without mention of obstruction  or gangrene  Past Surgical History: 1965: ABDOMINAL SURGERY 1969: arm surgery 01/15/2012: COLONOSCOPY     Comment:  Dr Lemar Livings 12/04/2016: COLONOSCOPY WITH PROPOFOL; N/A     Comment:  Procedure: COLONOSCOPY WITH PROPOFOL;  Surgeon: Earline Mayotte, MD;  Location: [facility] ENDOSCOPY;  Service:               Endoscopy;  Laterality: N/A; 12/17/2019: COLONOSCOPY WITH PROPOFOL; N/A     Comment:  Procedure: COLONOSCOPY WITH PROPOFOL;  Surgeon: Earline Mayotte, MD;  Location: [facility] ENDOSCOPY;  Service:               Endoscopy;  Laterality: N/A; December 2013: HERNIA REPAIR     Comment:  ventral hernia,umbilical hernia October 1983: KNEE ARTHROSCOPY     Comment:  right knee October 2011: KNEE SURGERY     Comment:  left knee May 1965: LOBECTOMY 1965: stomach tube 1965: tracheotomy 1963: wires removed from under arm  BMI    Body Mass Index: 37.74 kg/m      Reproductive/Obstetrics negative OB ROS                             Anesthesia Physical Anesthesia Plan  ASA: 3  Anesthesia Plan: General   Post-op Pain Management:    Induction: Intravenous  PONV Risk Score and Plan: 1 and Propofol infusion and TIVA  Airway Management Planned: Natural Airway and Nasal Cannula  Additional Equipment:   Intra-op Plan:   Post-operative  Plan:   Informed Consent: I have reviewed the patients History and Physical, chart, labs and discussed the procedure including the risks, benefits and alternatives for the proposed anesthesia with the patient or authorized representative who has indicated his/her understanding and acceptance.     Dental Advisory Given  Plan Discussed with: Anesthesiologist, CRNA and Surgeon  Anesthesia Plan Comments: (Patient consented for risks of anesthesia including but not limited to:  - adverse reactions to medications - risk of airway placement if required -  damage to eyes, teeth, lips or other oral mucosa - nerve damage due to positioning  - sore throat or hoarseness - Damage to heart, brain, nerves, lungs, other parts of body or loss of life  Patient voiced understanding and assent.)        Anesthesia Quick Evaluation

## 2023-03-28 NOTE — Anesthesia Procedure Notes (Signed)
 Procedure Name: General with mask airway Date/Time: 03/28/2023 12:35 PM  Performed by: Lily Lovings, CRNAPre-anesthesia Checklist: Patient identified, Emergency Drugs available, Suction available, Patient being monitored and Timeout performed Patient Re-evaluated:Patient Re-evaluated prior to induction Oxygen Delivery Method: Simple face mask Preoxygenation: Pre-oxygenation with 100% oxygen Induction Type: IV induction Comments: pom

## 2023-03-28 NOTE — Transfer of Care (Signed)
 Immediate Anesthesia Transfer of Care Note  Patient: AKARI CRYSLER  Procedure(s) Performed: COLONOSCOPY POLYPECTOMY  Patient Location: Endoscopy Unit  Anesthesia Type:General  Level of Consciousness: drowsy and patient cooperative  Airway & Oxygen Therapy: Patient Spontanous Breathing  Post-op Assessment: Report given to RN and Post -op Vital signs reviewed and stable  Post vital signs: Reviewed and stable  Last Vitals:  Vitals Value Taken Time  BP 126/79 03/28/23 1314  Temp 37 C 03/28/23 1314  Pulse 66 03/28/23 1314  Resp 19 03/28/23 1314  SpO2 100 % 03/28/23 1314  Vitals shown include unfiled device data.  Last Pain:  Vitals:   03/28/23 1314  TempSrc: Tympanic  PainSc: 0-No pain         Complications: No notable events documented.

## 2023-03-28 NOTE — H&P (Signed)
 Pre-Procedure H&P   Patient ID: Douglas Newton is a 63 y.o. male.  Gastroenterology Provider: Jaynie Collins, DO  Referring Provider: Dr. Burnett Sheng PCP: Jerl Mina, MD  Date: 03/28/2023  HPI Mr. Douglas Newton is a 63 y.o. male who presents today for Colonoscopy for Personal history of advanced colon polyps .  Bowel movement 1-2 times daily.  No melena or hematochezia.  Patient last underwent colonoscopy in November 2021 demonstrating a 12 mm tubulovillous adenoma in the cecum and 4 tubular adenomas ranging from 5 to 10 mm throughout the rest the colon which were resected.  He also had adenomatous polyps in November 2018 colonoscopy  No family history of colon cancer or colon polyps  Hemoglobin 15 MCV 90 platelets 241,000 creatinine 0.7   Past Medical History:  Diagnosis Date   Arthritis 2011   Chicken pox    Colon polyp 01/15/2012   Diarrhea    GERD (gastroesophageal reflux disease)    Hernia 2007   admits 2006   Hyperlipemia    Hypertension admits 2011   Obesity, unspecified    Personal history of tobacco use, presenting hazards to health    Screening for obesity    Seizure disorder (HCC) 1965   admits 1964   Syncope and collapse    Ulcer 1975   admits 1974   Umbilical hernia without mention of obstruction or gangrene    Ventral hernia, unspecified, without mention of obstruction or gangrene     Past Surgical History:  Procedure Laterality Date   ABDOMINAL SURGERY  1965   arm surgery  1969   COLONOSCOPY  01/15/2012   Dr Lemar Livings   COLONOSCOPY WITH PROPOFOL N/A 12/04/2016   Procedure: COLONOSCOPY WITH PROPOFOL;  Surgeon: Earline Mayotte, MD;  Location: [facility] ENDOSCOPY;  Service: Endoscopy;  Laterality: N/A;   COLONOSCOPY WITH PROPOFOL N/A 12/17/2019   Procedure: COLONOSCOPY WITH PROPOFOL;  Surgeon: Earline Mayotte, MD;  Location: [facility] ENDOSCOPY;  Service: Endoscopy;  Laterality: N/A;   HERNIA REPAIR  December 2013   ventral  hernia,umbilical hernia   KNEE ARTHROSCOPY  October 1983   right knee   KNEE SURGERY  October 2011   left knee   LOBECTOMY  May 1965   stomach tube  1965   tracheotomy  1965   wires removed from under arm  1963    Family History No h/o GI disease or malignancy  Review of Systems  Constitutional:  Negative for activity change, appetite change, chills, diaphoresis, fatigue, fever and unexpected weight change.  HENT:  Negative for trouble swallowing and voice change.   Respiratory:  Negative for shortness of breath and wheezing.   Cardiovascular:  Negative for chest pain, palpitations and leg swelling.  Gastrointestinal:  Negative for abdominal distention, abdominal pain, anal bleeding, blood in stool, constipation, diarrhea, nausea and vomiting.  Musculoskeletal:  Negative for arthralgias and myalgias.  Skin:  Negative for color change and pallor.  Neurological:  Negative for dizziness, syncope and weakness.  Psychiatric/Behavioral:  Negative for confusion. The patient is not nervous/anxious.   All other systems reviewed and are negative.    Medications No current facility-administered medications on file prior to encounter.   Current Outpatient Medications on File Prior to Encounter  Medication Sig Dispense Refill   carbamazepine (TEGRETOL) 200 MG tablet Take 200 mg by mouth 4 (four) times daily.     carvedilol (COREG) 12.5 MG tablet Take 12.5 mg by mouth 2 (two) times daily with a meal.  escitalopram (LEXAPRO) 10 MG tablet Take 10 mg by mouth daily.     fluticasone (FLONASE) 50 MCG/ACT nasal spray Place 2 sprays into both nostrils daily.     hydrALAZINE (APRESOLINE) 50 MG tablet Take 50 mg by mouth 3 (three) times daily.     hydrochlorothiazide (HYDRODIURIL) 25 MG tablet Take 25 mg by mouth daily.     losartan (COZAAR) 100 MG tablet Take 100 mg by mouth daily.     minoxidil (LONITEN) 2.5 MG tablet Take by mouth daily.     venlafaxine (EFFEXOR) 37.5 MG tablet Take 37.5 mg by  mouth 2 (two) times daily.     sildenafil (VIAGRA) 100 MG tablet Take 100 mg by mouth daily as needed for erectile dysfunction.      Pertinent medications related to GI and procedure were reviewed by me with the patient prior to the procedure   Current Facility-Administered Medications:    0.9 %  sodium chloride infusion, , Intravenous, Continuous, Jaynie Collins, DO  sodium chloride         No Known Allergies Allergies were reviewed by me prior to the procedure  Objective   Body mass index is 37.74 kg/m. Vitals:   03/28/23 1214 03/28/23 1221  BP:  (!) 165/75  Pulse:  63  Resp:  17  Temp:  (!) 97.5 F (36.4 C)  TempSrc:  Temporal  SpO2:  99%  Weight: 106.1 kg   Height: 5\' 6"  (1.676 m)      Physical Exam Vitals and nursing note reviewed.  Constitutional:      General: He is not in acute distress.    Appearance: Normal appearance. He is obese. He is not ill-appearing, toxic-appearing or diaphoretic.  HENT:     Head: Normocephalic and atraumatic.     Nose: Nose normal.     Mouth/Throat:     Mouth: Mucous membranes are moist.     Pharynx: Oropharynx is clear.  Eyes:     General: No scleral icterus.    Extraocular Movements: Extraocular movements intact.  Cardiovascular:     Rate and Rhythm: Normal rate and regular rhythm.     Heart sounds: Normal heart sounds. No murmur heard.    No friction rub. No gallop.  Pulmonary:     Effort: Pulmonary effort is normal. No respiratory distress.     Breath sounds: Normal breath sounds. No wheezing, rhonchi or rales.  Abdominal:     General: Bowel sounds are normal. There is no distension.     Palpations: Abdomen is soft.     Tenderness: There is no abdominal tenderness. There is no guarding or rebound.  Musculoskeletal:     Cervical back: Neck supple.     Right lower leg: No edema.     Left lower leg: No edema.  Skin:    General: Skin is warm and dry.     Coloration: Skin is not jaundiced or pale.   Neurological:     General: No focal deficit present.     Mental Status: He is alert and oriented to person, place, and time. Mental status is at baseline.  Psychiatric:        Mood and Affect: Mood normal.        Behavior: Behavior normal.        Thought Content: Thought content normal.        Judgment: Judgment normal.      Assessment:  Mr. Douglas Newton is a 63 y.o. male  who presents  today for Colonoscopy for Personal history of advanced colon polyps .  Plan:  Colonoscopy with possible intervention today  Colonoscopy with possible biopsy, control of bleeding, polypectomy, and interventions as necessary has been discussed with the patient/patient representative. Informed consent was obtained from the patient/patient representative after explaining the indication, nature, and risks of the procedure including but not limited to death, bleeding, perforation, missed neoplasm/lesions, cardiorespiratory compromise, and reaction to medications. Opportunity for questions was given and appropriate answers were provided. Patient/patient representative has verbalized understanding is amenable to undergoing the procedure.   Jaynie Collins, DO  [facility]  Portions of the record may have been created with voice recognition software. Occasional wrong-word or 'sound-a-like' substitutions may have occurred due to the inherent limitations of voice recognition software.  Read the chart carefully and recognize, using context, where substitutions may have occurred.

## 2023-04-01 LAB — SURGICAL PATHOLOGY

## 2023-04-07 NOTE — Anesthesia Postprocedure Evaluation (Signed)
 Anesthesia Post Note  Patient: Douglas Newton  Procedure(s) Performed: COLONOSCOPY POLYPECTOMY  Patient location during evaluation: Endoscopy Anesthesia Type: General Level of consciousness: awake and alert Pain management: pain level controlled Vital Signs Assessment: post-procedure vital signs reviewed and stable Respiratory status: spontaneous breathing, nonlabored ventilation, respiratory function stable and patient connected to nasal cannula oxygen Cardiovascular status: blood pressure returned to baseline and stable Postop Assessment: no apparent nausea or vomiting Anesthetic complications: no   No notable events documented.   Last Vitals:  Vitals:   03/28/23 1324 03/28/23 1334  BP: (!) 155/85 (!) 165/85  Pulse: 61 60  Resp: 15 15  Temp:    SpO2: 100% 100%    Last Pain:  Vitals:   03/31/23 0729  TempSrc:   PainSc: 0-No pain                 Louie Boston

## 2023-06-06 ENCOUNTER — Emergency Department

## 2023-06-06 ENCOUNTER — Other Ambulatory Visit: Payer: Self-pay

## 2023-06-06 ENCOUNTER — Emergency Department
Admission: EM | Admit: 2023-06-06 | Discharge: 2023-06-06 | Disposition: A | Discharge status: Home / Self Care | Attending: Emergency Medicine | Admitting: Emergency Medicine

## 2023-06-06 DIAGNOSIS — R55 Syncope and collapse: Secondary | ICD-10-CM | POA: Insufficient documentation

## 2023-06-06 DIAGNOSIS — R0789 Other chest pain: Secondary | ICD-10-CM | POA: Insufficient documentation

## 2023-06-06 DIAGNOSIS — M542 Cervicalgia: Secondary | ICD-10-CM | POA: Insufficient documentation

## 2023-06-06 DIAGNOSIS — M791 Myalgia, unspecified site: Secondary | ICD-10-CM | POA: Diagnosis not present

## 2023-06-06 DIAGNOSIS — R52 Pain, unspecified: Secondary | ICD-10-CM

## 2023-06-06 LAB — CBC WITH DIFFERENTIAL/PLATELET
Abs Immature Granulocytes: 0.04 10*3/uL (ref 0.00–0.07)
Basophils Absolute: 0 10*3/uL (ref 0.0–0.1)
Basophils Relative: 0 %
Eosinophils Absolute: 0 10*3/uL (ref 0.0–0.5)
Eosinophils Relative: 0 %
HCT: 42 % (ref 39.0–52.0)
Hemoglobin: 14.2 g/dL (ref 13.0–17.0)
Immature Granulocytes: 1 %
Lymphocytes Relative: 7 %
Lymphs Abs: 0.6 10*3/uL — ABNORMAL LOW (ref 0.7–4.0)
MCH: 30 pg (ref 26.0–34.0)
MCHC: 33.8 g/dL (ref 30.0–36.0)
MCV: 88.6 fL (ref 80.0–100.0)
Monocytes Absolute: 1.2 10*3/uL — ABNORMAL HIGH (ref 0.1–1.0)
Monocytes Relative: 14 %
Neutro Abs: 7 10*3/uL (ref 1.7–7.7)
Neutrophils Relative %: 78 %
Platelets: 211 10*3/uL (ref 150–400)
RBC: 4.74 MIL/uL (ref 4.22–5.81)
RDW: 12.5 % (ref 11.5–15.5)
WBC: 8.9 10*3/uL (ref 4.0–10.5)
nRBC: 0 % (ref 0.0–0.2)

## 2023-06-06 LAB — COMPREHENSIVE METABOLIC PANEL WITH GFR
ALT: 27 U/L (ref 0–44)
AST: 39 U/L (ref 15–41)
Albumin: 3.7 g/dL (ref 3.5–5.0)
Alkaline Phosphatase: 84 U/L (ref 38–126)
Anion gap: 10 (ref 5–15)
BUN: 19 mg/dL (ref 8–23)
CO2: 21 mmol/L — ABNORMAL LOW (ref 22–32)
Calcium: 8.6 mg/dL — ABNORMAL LOW (ref 8.9–10.3)
Chloride: 101 mmol/L (ref 98–111)
Creatinine, Ser: 1.04 mg/dL (ref 0.61–1.24)
GFR, Estimated: >60 mL/min (ref >60)
Glucose, Bld: 116 mg/dL — ABNORMAL HIGH (ref 70–99)
Potassium: 3.9 mmol/L (ref 3.5–5.1)
Sodium: 132 mmol/L — ABNORMAL LOW (ref 135–145)
Total Bilirubin: 1.1 mg/dL (ref 0.0–1.2)
Total Protein: 6.8 g/dL (ref 6.5–8.1)

## 2023-06-06 LAB — CK: Total CK: 453 U/L — ABNORMAL HIGH (ref 49–397)

## 2023-06-06 LAB — TROPONIN I (HIGH SENSITIVITY)
Troponin I (High Sensitivity): 21 ng/L — ABNORMAL HIGH (ref <18)
Troponin I (High Sensitivity): 21 ng/L — ABNORMAL HIGH (ref <18)

## 2023-06-06 MED ORDER — ONDANSETRON HCL 4 MG/2ML IJ SOLN
4.0000 mg | INTRAMUSCULAR | Status: AC
  Administered 2023-06-06: 4 mg via INTRAVENOUS
  Filled 2023-06-06: qty 2

## 2023-06-06 MED ORDER — KETOROLAC TROMETHAMINE 30 MG/ML IJ SOLN
15.0000 mg | Freq: Once | INTRAMUSCULAR | Status: AC
  Administered 2023-06-06: 15 mg via INTRAVENOUS
  Filled 2023-06-06: qty 1

## 2023-06-06 MED ORDER — SODIUM CHLORIDE 0.9 % IV BOLUS
500.0000 mL | Freq: Once | INTRAVENOUS | Status: AC
  Administered 2023-06-06: 500 mL via INTRAVENOUS

## 2023-06-06 MED ORDER — MORPHINE SULFATE (PF) 4 MG/ML IV SOLN
4.0000 mg | Freq: Once | INTRAVENOUS | Status: AC
  Administered 2023-06-06: 4 mg via INTRAVENOUS
  Filled 2023-06-06: qty 1

## 2023-06-06 NOTE — ED Notes (Signed)
 Called lab to come draw blood from pt as 2xs attempt unsuccessful.

## 2023-06-06 NOTE — Discharge Instructions (Addendum)

## 2023-06-06 NOTE — ED Notes (Signed)
 Called CCMD to put pt on monitoring spoke with Aldon Ambrosia

## 2023-06-06 NOTE — ED Triage Notes (Signed)
 Pt arrives by EMS from work. EMS reports pt had an unwitnessed syncopal episode and fell on the bathroom floor. Pt denies blood thinner use and is unsure if he hit his head. Pt reports he is having chest pain and hurts all over and feels weak. Pt was given 500 bolus of NS and aspirin  in route with EMS.

## 2023-06-06 NOTE — ED Provider Notes (Signed)
 [facility] Provider Note    Event Date/Time   First MD Initiated Contact with Patient 06/06/23 0032     (approximate)   History   Near Syncope   HPI Douglas Newton is a 63 y.o. male who presents by EMS for evaluation of apparent syncopal episode.  He was at work and went to the break room after taking his medicines.  He was found at some point with unknown downtime.  He thinks that he passed out but also mentions that he has a history of seizures.  He said his whole body hurts including his neck and his chest.  However he states that he has generalized pain everywhere.  No numbness or weakness in his arms or his legs.  No visual deficits.  He is answering questions but minimally communicative otherwise.  He yells whenever anyone touches him on any part of his body.  He denies difficulty breathing.  He denies recent fever.  He states that he is compliant with his antiseizure medicine.  EMS reports that when they got there his blood pressure was low, but with only about 250 mL of IV fluid he came up to about 160 systolic     Physical Exam   Triage Vital Signs: ED Triage Vitals  Encounter Vitals Group     BP 06/06/23 0039 130/62     Systolic BP Percentile --      Diastolic BP Percentile --      Pulse Rate 06/06/23 0033 (!) 58     Resp 06/06/23 0033 18     Temp 06/06/23 0033 97.6 F (36.4 C)     Temp Source 06/06/23 0033 Oral     SpO2 06/06/23 0033 98 %     Weight 06/06/23 0040 104.3 kg (230 lb)     Height 06/06/23 0040 1.676 m (5\' 6" )     Head Circumference --      Peak Flow --      Pain Score 06/06/23 0039 6     Pain Loc --      Pain Education --      Exclude from Growth Chart --     Most recent vital signs: Vitals:   06/06/23 0103 06/06/23 0330  BP: (!) 141/79 (!) 161/83  Pulse: 63 81  Resp: 15 (!) 21  Temp: 99.1 F (37.3 C)   SpO2: 99% 99%    General: Awake, alert, no obvious distress but decreased level of communication although  he answers questions appropriately. CV:  Good peripheral perfusion.  Regular rate and rhythm with normal heart sounds. Resp:  Normal effort. Speaking easily and comfortably, no accessory muscle usage nor intercostal retractions.  Lungs are clear to auscultation bilaterally. Abd:  No distention.  Patient initially yelled when I palpated his abdomen, just as when I palpated his chest wall and touched his arms and his head and neck to assess for traumatic injury, but with distraction he relaxes and he has no tenderness to palpation and a soft abdomen. Other:  Patient reports pain of his neck but none localizable and no point tenderness.  He is voluntarily moving his head and neck, both with flexion extension and rotation from side-to-side.  No obvious head injury.  He is moving all 4 of his extremities and has no obvious extremity injuries.   ED Results / Procedures / Treatments   Labs (all labs ordered are listed, but only abnormal results are displayed) Labs Reviewed  CBC WITH DIFFERENTIAL/PLATELET - Abnormal;  Notable for the following components:      Result Value   Lymphs Abs 0.6 (*)    Monocytes Absolute 1.2 (*)    All other components within normal limits  COMPREHENSIVE METABOLIC PANEL WITH GFR - Abnormal; Notable for the following components:   Sodium 132 (*)    CO2 21 (*)    Glucose, Bld 116 (*)    Calcium 8.6 (*)    All other components within normal limits  CK - Abnormal; Notable for the following components:   Total CK 453 (*)    All other components within normal limits  TROPONIN I (HIGH SENSITIVITY) - Abnormal; Notable for the following components:   Troponin I (High Sensitivity) 21 (*)    All other components within normal limits  TROPONIN I (HIGH SENSITIVITY) - Abnormal; Notable for the following components:   Troponin I (High Sensitivity) 21 (*)    All other components within normal limits     EKG  ED ECG REPORT I, Lynnda Sas, the attending physician, personally  viewed and interpreted this ECG.  Date: 06/06/2023 EKG Time: 00: 38 Rate: 52 Rhythm: Sinus bradycardia QRS Axis: normal Intervals: normal ST/T Wave abnormalities: Non-specific ST segment / T-wave changes, but no clear evidence of acute ischemia. Narrative Interpretation: no definitive evidence of acute ischemia; does not meet STEMI criteria.    RADIOLOGY See ED course for details   PROCEDURES:  Critical Care performed: No  .1-3 Lead EKG Interpretation  Performed by: Lynnda Sas, MD Authorized by: Lynnda Sas, MD     Interpretation: normal     ECG rate:  81   ECG rate assessment: normal     Rhythm: sinus rhythm     Ectopy: none     Conduction: normal       IMPRESSION / MDM / ASSESSMENT AND PLAN / ED COURSE  I reviewed the triage vital signs and the nursing notes.                              Differential diagnosis includes, but is not limited to, nonspecific syncope, vasovagal episode, cardiogenic syncope, medication or drug side effect, seizure, ACS, PE.  Patient's presentation is most consistent with acute presentation with potential threat to life or bodily function.  Labs/studies ordered: High sensitive troponin x 2, CK, CMP, CBC with differential, CT head, CT cervical spine, 1 view chest x-ray, EKG  Interventions/Medications given:  Medications  sodium chloride  0.9 % bolus 500 mL (0 mLs Intravenous Stopped 06/06/23 0224)  morphine (PF) 4 MG/ML injection 4 mg (4 mg Intravenous Given 06/06/23 0305)  ondansetron  (ZOFRAN ) injection 4 mg (4 mg Intravenous Given 06/06/23 0305)  ketorolac (TORADOL) 30 MG/ML injection 15 mg (15 mg Intravenous Given 06/06/23 0304)    (Note:  hospital course my include additional interventions and/or labs/studies not listed above.)   Difficult to appreciate whether the patient is just hurting in general or whether he is postictal.  Vital signs stable and within normal limits; he was reportedly hypotensive by EMS (possibly vasovagal?)  but is normotensive now after only a small fluid bolus.  I will evaluate broadly including imaging to rule out both acute traumatic injury and intracranial bleed that could have led to his fall/syncope.  No medication at this time but will follow-up shortly.  EKG generally reassuring.  Patient is reporting pain all over including the chest, but not specifically having chest pain  The patient is on the  cardiac monitor to evaluate for evidence of arrhythmia and/or significant heart rate changes.   Clinical Course as of 06/06/23 0446  Fri Jun 06, 2023  0214 Labs are notable for a CK of 453 which is of unclear clinical significance given the fall, the downtime, and the question of possible seizure activity.  High-sensitivity troponin is slightly elevated at 21 but this is apparently chronic for him.  CMP and CBC are within normal limits. [CF]  0214 Ordered morphine 4 mg IV, Toradol 15 mg IV, and Zofran  4 mg IV to the help with the patient's generalized pain which is likely the result of his fall and to prevent nausea. [CF]  0215 I viewed and interpreted the patient's head CT and cervical spine CTs.  There is no evidence of an acute or emergent traumatic injury from the fall.  Radiology report agrees. [CF]  0215 DG Chest Portable 1 View I viewed and interpreted the patient's 1 view chest x-ray and I see no evidence of pneumonia, pneumothorax, nor rib fractures.  I also read the radiologist's report, which confirmed no acute findings. [CF]  0438 Patient's second high-sensitivity troponin is stable.  I reassessed him and he is awake and alert, much more communicative than he was earlier.  He states he feels much better.  He said he does not think that he had a seizure because usually he has a very specific aura before having the seizure.  He said that he has had some viral symptoms recently including a cough and he has been getting very little sleep and wonders if that could have contributed to the issue and  episode he had earlier.  I considered hospitalization but there is no evidence of an emergent medical condition.  He is having no ongoing symptoms at this time and he is very comfortable with the plan for discharge and outpatient follow-up with his PCP.  I strongly encouraged him to call first thing in the morning and schedule next available follow-up appointment and I gave my usual and customary return precautions. [CF]    Clinical Course User Index [CF] Lynnda Sas, MD     FINAL CLINICAL IMPRESSION(S) / ED DIAGNOSES   Final diagnoses:  Syncope and collapse  Generalized pain     Rx / DC Orders   ED Discharge Orders     None        Note:  This document was prepared using Dragon voice recognition software and may include unintentional dictation errors.   Lynnda Sas, MD 06/06/23 [id number]

## 2023-06-06 NOTE — ED Notes (Signed)
 Collected Covid swab and sent to lab with [facility] temp label

## 2023-07-14 ENCOUNTER — Emergency Department

## 2023-07-14 ENCOUNTER — Inpatient Hospital Stay
Admission: EM | Admit: 2023-07-14 | Discharge: 2023-07-17 | DRG: 291 | Disposition: A | Discharge status: Home / Self Care | Attending: Internal Medicine | Admitting: Internal Medicine

## 2023-07-14 ENCOUNTER — Other Ambulatory Visit: Payer: Self-pay

## 2023-07-14 DIAGNOSIS — Z87891 Personal history of nicotine dependence: Secondary | ICD-10-CM

## 2023-07-14 DIAGNOSIS — I2510 Atherosclerotic heart disease of native coronary artery without angina pectoris: Secondary | ICD-10-CM | POA: Diagnosis present

## 2023-07-14 DIAGNOSIS — I2489 Other forms of acute ischemic heart disease: Secondary | ICD-10-CM | POA: Diagnosis present

## 2023-07-14 DIAGNOSIS — Z860100 Personal history of colon polyps, unspecified: Secondary | ICD-10-CM

## 2023-07-14 DIAGNOSIS — I5033 Acute on chronic diastolic (congestive) heart failure: Secondary | ICD-10-CM | POA: Diagnosis present

## 2023-07-14 DIAGNOSIS — I10 Essential (primary) hypertension: Secondary | ICD-10-CM | POA: Diagnosis not present

## 2023-07-14 DIAGNOSIS — Z6837 Body mass index (BMI) 37.0-37.9, adult: Secondary | ICD-10-CM

## 2023-07-14 DIAGNOSIS — Z808 Family history of malignant neoplasm of other organs or systems: Secondary | ICD-10-CM

## 2023-07-14 DIAGNOSIS — F32A Depression, unspecified: Secondary | ICD-10-CM | POA: Diagnosis not present

## 2023-07-14 DIAGNOSIS — Z7951 Long term (current) use of inhaled steroids: Secondary | ICD-10-CM

## 2023-07-14 DIAGNOSIS — I959 Hypotension, unspecified: Secondary | ICD-10-CM | POA: Diagnosis present

## 2023-07-14 DIAGNOSIS — Z791 Long term (current) use of non-steroidal anti-inflammatories (NSAID): Secondary | ICD-10-CM

## 2023-07-14 DIAGNOSIS — I110 Hypertensive heart disease with heart failure: Secondary | ICD-10-CM | POA: Diagnosis not present

## 2023-07-14 DIAGNOSIS — K219 Gastro-esophageal reflux disease without esophagitis: Secondary | ICD-10-CM | POA: Diagnosis present

## 2023-07-14 DIAGNOSIS — Z79899 Other long term (current) drug therapy: Secondary | ICD-10-CM

## 2023-07-14 DIAGNOSIS — R0602 Shortness of breath: Principal | ICD-10-CM

## 2023-07-14 DIAGNOSIS — R531 Weakness: Secondary | ICD-10-CM | POA: Diagnosis not present

## 2023-07-14 DIAGNOSIS — Z1152 Encounter for screening for COVID-19: Secondary | ICD-10-CM

## 2023-07-14 DIAGNOSIS — Z8249 Family history of ischemic heart disease and other diseases of the circulatory system: Secondary | ICD-10-CM

## 2023-07-14 DIAGNOSIS — I700 Atherosclerosis of aorta: Secondary | ICD-10-CM | POA: Diagnosis present

## 2023-07-14 DIAGNOSIS — E669 Obesity, unspecified: Secondary | ICD-10-CM | POA: Diagnosis present

## 2023-07-14 DIAGNOSIS — J9811 Atelectasis: Secondary | ICD-10-CM | POA: Diagnosis present

## 2023-07-14 DIAGNOSIS — G40909 Epilepsy, unspecified, not intractable, without status epilepticus: Secondary | ICD-10-CM | POA: Diagnosis present

## 2023-07-14 DIAGNOSIS — E785 Hyperlipidemia, unspecified: Secondary | ICD-10-CM | POA: Diagnosis present

## 2023-07-14 DIAGNOSIS — J4489 Other specified chronic obstructive pulmonary disease: Secondary | ICD-10-CM | POA: Diagnosis present

## 2023-07-14 LAB — BASIC METABOLIC PANEL WITH GFR
Anion gap: 7 (ref 5–15)
BUN: 14 mg/dL (ref 8–23)
CO2: 26 mmol/L (ref 22–32)
Calcium: 8.6 mg/dL — ABNORMAL LOW (ref 8.9–10.3)
Chloride: 104 mmol/L (ref 98–111)
Creatinine, Ser: 0.74 mg/dL (ref 0.61–1.24)
GFR, Estimated: >60 mL/min (ref >60)
Glucose, Bld: 144 mg/dL — ABNORMAL HIGH (ref 70–99)
Potassium: 3.8 mmol/L (ref 3.5–5.1)
Sodium: 137 mmol/L (ref 135–145)

## 2023-07-14 LAB — CBC
HCT: 39.6 % (ref 39.0–52.0)
HCT: 38.9 % — ABNORMAL LOW (ref 39.0–52.0)
Hemoglobin: 13 g/dL (ref 13.0–17.0)
Hemoglobin: 13 g/dL (ref 13.0–17.0)
MCH: 29.4 pg (ref 26.0–34.0)
MCH: 29.7 pg (ref 26.0–34.0)
MCHC: 32.8 g/dL (ref 30.0–36.0)
MCHC: 33.4 g/dL (ref 30.0–36.0)
MCV: 89.6 fL (ref 80.0–100.0)
MCV: 88.8 fL (ref 80.0–100.0)
Platelets: 304 10*3/uL (ref 150–400)
Platelets: 280 10*3/uL (ref 150–400)
RBC: 4.42 MIL/uL (ref 4.22–5.81)
RBC: 4.38 MIL/uL (ref 4.22–5.81)
RDW: 12.5 % (ref 11.5–15.5)
RDW: 12.5 % (ref 11.5–15.5)
WBC: 13 10*3/uL — ABNORMAL HIGH (ref 4.0–10.5)
WBC: 11.9 10*3/uL — ABNORMAL HIGH (ref 4.0–10.5)
nRBC: 0 % (ref 0.0–0.2)
nRBC: 0 % (ref 0.0–0.2)

## 2023-07-14 LAB — CREATININE, SERUM
Creatinine, Ser: 0.62 mg/dL (ref 0.61–1.24)
GFR, Estimated: >60 mL/min (ref >60)

## 2023-07-14 LAB — URINALYSIS, ROUTINE W REFLEX MICROSCOPIC
Bilirubin Urine: NEGATIVE
Glucose, UA: NEGATIVE mg/dL
Hgb urine dipstick: NEGATIVE
Ketones, ur: NEGATIVE mg/dL
Leukocytes,Ua: NEGATIVE
Nitrite: NEGATIVE
Protein, ur: NEGATIVE mg/dL
Specific Gravity, Urine: 1.016 (ref 1.005–1.030)
pH: 7 (ref 5.0–8.0)

## 2023-07-14 LAB — RESP PANEL BY RT-PCR (RSV, FLU A&B, COVID)  RVPGX2
Influenza A by PCR: NEGATIVE
Influenza B by PCR: NEGATIVE
Resp Syncytial Virus by PCR: NEGATIVE
SARS Coronavirus 2 by RT PCR: NEGATIVE

## 2023-07-14 LAB — LACTIC ACID, PLASMA: Lactic Acid, Venous: 1.5 mmol/L (ref 0.5–1.9)

## 2023-07-14 LAB — PROCALCITONIN: Procalcitonin: <0.1 ng/mL

## 2023-07-14 LAB — TROPONIN I (HIGH SENSITIVITY)
Troponin I (High Sensitivity): 26 ng/L — ABNORMAL HIGH (ref <18)
Troponin I (High Sensitivity): 24 ng/L — ABNORMAL HIGH (ref <18)

## 2023-07-14 LAB — BRAIN NATRIURETIC PEPTIDE: B Natriuretic Peptide: 339 pg/mL — ABNORMAL HIGH (ref 0.0–100.0)

## 2023-07-14 LAB — D-DIMER, QUANTITATIVE: D-Dimer, Quant: 0.39 ug{FEU}/mL (ref 0.00–0.50)

## 2023-07-14 MED ORDER — IPRATROPIUM-ALBUTEROL 0.5-2.5 (3) MG/3ML IN SOLN
3.0000 mL | Freq: Once | RESPIRATORY_TRACT | Status: AC
  Administered 2023-07-14: 3 mL via RESPIRATORY_TRACT
  Filled 2023-07-14: qty 3

## 2023-07-14 MED ORDER — HYDRALAZINE HCL 50 MG PO TABS
50.0000 mg | ORAL_TABLET | Freq: Three times a day (TID) | ORAL | Status: DC
  Administered 2023-07-14 – 2023-07-16 (×5): 50 mg via ORAL
  Filled 2023-07-14 (×5): qty 1

## 2023-07-14 MED ORDER — ACETAMINOPHEN 650 MG RE SUPP: 650.0000 mg | Freq: Four times a day (QID) | RECTAL | Status: DC | PRN

## 2023-07-14 MED ORDER — CARBAMAZEPINE 200 MG PO TABS
200.0000 mg | ORAL_TABLET | Freq: Once | ORAL | Status: AC
  Administered 2023-07-14: 200 mg via ORAL
  Filled 2023-07-14: qty 1

## 2023-07-14 MED ORDER — ENOXAPARIN SODIUM 60 MG/0.6ML IJ SOSY
50.0000 mg | PREFILLED_SYRINGE | INTRAMUSCULAR | Status: DC
  Administered 2023-07-14 – 2023-07-16 (×3): 50 mg via SUBCUTANEOUS
  Filled 2023-07-14 (×3): qty 0.6

## 2023-07-14 MED ORDER — CARVEDILOL 12.5 MG PO TABS
12.5000 mg | ORAL_TABLET | Freq: Two times a day (BID) | ORAL | Status: DC
  Administered 2023-07-14 – 2023-07-16 (×3): 12.5 mg via ORAL
  Filled 2023-07-14: qty 1
  Filled 2023-07-14: qty 2
  Filled 2023-07-14: qty 1

## 2023-07-14 MED ORDER — ONDANSETRON HCL 4 MG/2ML IJ SOLN
4.0000 mg | Freq: Four times a day (QID) | INTRAMUSCULAR | Status: DC | PRN
  Administered 2023-07-14: 4 mg via INTRAVENOUS
  Filled 2023-07-14: qty 2

## 2023-07-14 MED ORDER — TRAZODONE HCL 50 MG PO TABS: 25.0000 mg | ORAL_TABLET | Freq: Every evening | ORAL | Status: DC | PRN

## 2023-07-14 MED ORDER — ALBUTEROL SULFATE (2.5 MG/3ML) 0.083% IN NEBU
3.0000 mL | INHALATION_SOLUTION | Freq: Four times a day (QID) | RESPIRATORY_TRACT | Status: DC | PRN
  Administered 2023-07-14 – 2023-07-17 (×6): 3 mL via RESPIRATORY_TRACT
  Filled 2023-07-14 (×6): qty 3

## 2023-07-14 MED ORDER — LOSARTAN POTASSIUM 50 MG PO TABS
100.0000 mg | ORAL_TABLET | Freq: Every day | ORAL | Status: DC
  Administered 2023-07-14 – 2023-07-16 (×3): 100 mg via ORAL
  Filled 2023-07-14 (×3): qty 2

## 2023-07-14 MED ORDER — ACETAMINOPHEN 325 MG PO TABS: 650.0000 mg | ORAL_TABLET | Freq: Four times a day (QID) | ORAL | Status: DC | PRN

## 2023-07-14 MED ORDER — LACTATED RINGERS IV BOLUS
1000.0000 mL | Freq: Once | INTRAVENOUS | Status: AC
  Administered 2023-07-14: 1000 mL via INTRAVENOUS

## 2023-07-14 MED ORDER — VENLAFAXINE HCL 37.5 MG PO TABS
37.5000 mg | ORAL_TABLET | Freq: Two times a day (BID) | ORAL | Status: DC
  Administered 2023-07-14 – 2023-07-17 (×6): 37.5 mg via ORAL
  Filled 2023-07-14 (×6): qty 1

## 2023-07-14 MED ORDER — SODIUM CHLORIDE 0.9 % IV BOLUS
500.0000 mL | Freq: Once | INTRAVENOUS | Status: AC
  Administered 2023-07-15: 500 mL via INTRAVENOUS

## 2023-07-14 MED ORDER — MINOXIDIL 2.5 MG PO TABS
2.5000 mg | ORAL_TABLET | Freq: Every day | ORAL | Status: DC
  Administered 2023-07-15 – 2023-07-17 (×3): 2.5 mg via ORAL
  Filled 2023-07-14 (×3): qty 1

## 2023-07-14 MED ORDER — SODIUM CHLORIDE 0.9 % IV SOLN: INTRAVENOUS | Status: DC

## 2023-07-14 MED ORDER — CARBAMAZEPINE 200 MG PO TABS
200.0000 mg | ORAL_TABLET | Freq: Four times a day (QID) | ORAL | Status: DC
  Administered 2023-07-14 – 2023-07-17 (×11): 200 mg via ORAL
  Filled 2023-07-14 (×13): qty 1

## 2023-07-14 MED ORDER — MAGNESIUM HYDROXIDE 400 MG/5ML PO SUSP: 30.0000 mL | Freq: Every day | ORAL | Status: DC | PRN

## 2023-07-14 MED ORDER — ONDANSETRON HCL 4 MG PO TABS: 4.0000 mg | ORAL_TABLET | Freq: Four times a day (QID) | ORAL | Status: DC | PRN

## 2023-07-14 MED ORDER — HYDROCHLOROTHIAZIDE 25 MG PO TABS
25.0000 mg | ORAL_TABLET | Freq: Once | ORAL | Status: DC
  Filled 2023-07-14: qty 1

## 2023-07-14 MED ORDER — ESCITALOPRAM OXALATE 10 MG PO TABS: 10.0000 mg | ORAL_TABLET | Freq: Every day | ORAL | Status: DC

## 2023-07-14 NOTE — H&P (Incomplete)
 [place]   PATIENT NAME: Douglas Newton    MR#:  [id number]  DATE OF BIRTH:  06-13-1960  DATE OF ADMISSION:  07/14/2023  PRIMARY CARE PHYSICIAN: Lyle San, MD   Patient is coming from: ***  REQUESTING/REFERRING PHYSICIAN: ***  CHIEF COMPLAINT:   Chief Complaint  Patient presents with  . Weakness    HISTORY OF PRESENT ILLNESS:  Douglas Newton is a 63 y.o. male with medical history significant for ***  ED Course: *** EKG as reviewed by me : *** Imaging: *** PAST MEDICAL HISTORY:   Past Medical History:  Diagnosis Date  . Arthritis 2011  . Chicken pox   . Colon polyp 01/15/2012  . Diarrhea   . GERD (gastroesophageal reflux disease)   . Hernia 2007   admits 2006  . Hyperlipemia   . Hypertension admits 2011  . Obesity, unspecified   . Personal history of tobacco use, presenting hazards to health   . Screening for obesity   . Seizure disorder (HCC) 1965   admits 1964  . Syncope and collapse   . Ulcer 1975   admits 1974  . Umbilical hernia without mention of obstruction or gangrene   . Ventral hernia, unspecified, without mention of obstruction or gangrene     PAST SURGICAL HISTORY:   Past Surgical History:  Procedure Laterality Date  . ABDOMINAL SURGERY  1965  . arm surgery  1969  . COLONOSCOPY  01/15/2012   Dr Marquita Situ  . COLONOSCOPY N/A 03/28/2023   Procedure: COLONOSCOPY;  Surgeon: Quintin Buckle, DO;  Location: [facility] ENDOSCOPY;  Service: Gastroenterology;  Laterality: N/A;  . COLONOSCOPY WITH PROPOFOL  N/A 12/04/2016   Procedure: COLONOSCOPY WITH PROPOFOL ;  Surgeon: Marshall Skeeter, MD;  Location: [facility] ENDOSCOPY;  Service: Endoscopy;  Laterality: N/A;  . COLONOSCOPY WITH PROPOFOL  N/A 12/17/2019   Procedure: COLONOSCOPY WITH PROPOFOL ;  Surgeon: Marshall Skeeter, MD;  Location: [facility] ENDOSCOPY;  Service: Endoscopy;  Laterality: N/A;  . HERNIA REPAIR  December 2013   ventral hernia,umbilical hernia  . KNEE ARTHROSCOPY  October  1983   right knee  . KNEE SURGERY  October 2011   left knee  . LOBECTOMY  May 1965  . POLYPECTOMY  03/28/2023   Procedure: POLYPECTOMY;  Surgeon: Quintin Buckle, DO;  Location: [facility] ENDOSCOPY;  Service: Gastroenterology;;  . stomach tube  1965  . tracheotomy  1965  . wires removed from under arm  1963    SOCIAL HISTORY:   Social History   Tobacco Use  . Smoking status: Some Days    Current packs/day: 0.00    Types: Cigars, Cigarettes  . Smokeless tobacco: Never  Substance Use Topics  . Alcohol use: Yes    Comment: rarely    FAMILY HISTORY:   Family History  Problem Relation Age of Onset  . Heart attack Maternal Grandfather   . Heart attack Maternal Grandmother   . Heart attack Mother   . Brain cancer Mother   . Hypertension Paternal Grandfather   . Hypertension Paternal Grandmother     DRUG ALLERGIES:  No Known Allergies  REVIEW OF SYSTEMS:   ROS As per history of present illness. All pertinent systems were reviewed above. Constitutional, HEENT, cardiovascular, respiratory, GI, GU, musculoskeletal, neuro, psychiatric, endocrine, integumentary and hematologic systems were reviewed and are otherwise negative/unremarkable except for positive findings mentioned above in the HPI.   MEDICATIONS AT HOME:   Prior to Admission medications   Medication Sig Start Date End  Date Taking? Authorizing Provider  albuterol (VENTOLIN HFA) 108 (90 Base) MCG/ACT inhaler Inhale 2 puffs into the lungs every 6 (six) hours as needed for shortness of breath or wheezing. 06/07/23 06/06/24 Yes [provider]  amoxicillin (AMOXIL) 500 MG capsule Take 1,000 mg by mouth every 8 (eight) hours. 06/07/23  Yes [provider]  carbamazepine  (TEGRETOL ) 200 MG tablet Take 200 mg by mouth 4 (four) times daily.   Yes [provider]  carvedilol (COREG) 12.5 MG tablet Take 12.5 mg by mouth 2 (two) times daily with a meal.   Yes [provider]  celecoxib  (CELEBREX) 200 MG capsule Take 200 mg by mouth daily. 05/27/23  Yes [provider]  chlorpheniramine-HYDROcodone  (TUSSIONEX) 10-8 MG/5ML Take 5 mLs by mouth at bedtime as needed. 06/05/23  Yes [provider]  doxycycline (VIBRAMYCIN) 100 MG capsule Take 100 mg by mouth. 06/07/23  Yes [provider]  escitalopram (LEXAPRO) 10 MG tablet Take 10 mg by mouth daily.   Yes [provider]  hydrALAZINE  (APRESOLINE ) 50 MG tablet Take 50 mg by mouth 3 (three) times daily.   Yes [provider]  losartan  (COZAAR ) 100 MG tablet Take 100 mg by mouth daily.   Yes [provider]  minoxidil (LONITEN) 2.5 MG tablet Take by mouth daily.   Yes [provider]  ondansetron  (ZOFRAN -ODT) 4 MG disintegrating tablet Take 8 mg by mouth. 07/14/23 07/14/23 Yes [provider]  predniSONE (DELTASONE) 50 MG tablet Take 50 mg by mouth daily. 06/07/23  Yes [provider]  venlafaxine (EFFEXOR) 37.5 MG tablet Take 37.5 mg by mouth 2 (two) times daily.   Yes [provider]  fluticasone  (FLONASE ) 50 MCG/ACT nasal spray Place 2 sprays into both nostrils daily.    [provider]  hydrochlorothiazide (HYDRODIURIL) 25 MG tablet Take 25 mg by mouth daily. Patient not taking: Reported on 07/14/2023 03/07/19   [provider]  meclizine  (ANTIVERT ) 25 MG tablet Take 1 tablet (25 mg total) by mouth 3 (three) times daily as needed for dizziness. Patient not taking: Reported on 07/14/2023 01/11/23   Claria Crofts, MD  sildenafil (VIAGRA) 100 MG tablet Take 100 mg by mouth daily as needed for erectile dysfunction. Patient not taking: Reported on 07/14/2023    [provider]      VITAL SIGNS:  Blood pressure (!) 153/74, pulse 94, temperature 98.2 F (36.8 C), temperature source Oral, resp. rate 20, height 5' 6 (1.676 m), weight 105.7 kg, SpO2 93%.  PHYSICAL EXAMINATION:  Physical Exam  GENERAL:  63 y.o.-year-old patient lying  in the bed with no acute distress.  EYES: Pupils equal, round, reactive to light and accommodation. No scleral icterus. Extraocular muscles intact.  HEENT: Head atraumatic, normocephalic. Oropharynx and nasopharynx clear.  NECK:  Supple, no jugular venous distention. No thyroid enlargement, no tenderness.  LUNGS: Normal breath sounds bilaterally, no wheezing, rales,rhonchi or crepitation. No use of accessory muscles of respiration.  CARDIOVASCULAR: Regular rate and rhythm, S1, S2 normal. No murmurs, rubs, or gallops.  ABDOMEN: Soft, nondistended, nontender. Bowel sounds present. No organomegaly or mass.  EXTREMITIES: No pedal edema, cyanosis, or clubbing.  NEUROLOGIC: Cranial nerves II through XII are intact. Muscle strength 5/5 in all extremities. Sensation intact. Gait not checked.  PSYCHIATRIC: The patient is alert and oriented x 3.  Normal affect and good eye contact. SKIN: No obvious rash, lesion, or ulcer.   LABORATORY PANEL:   CBC Recent Labs  Lab 07/14/23 1250  WBC 13.0*  HGB 13.0  HCT 39.6  PLT 304   ------------------------------------------------------------------------------------------------------------------  Chemistries  Recent Labs  Lab 07/14/23 1250  NA 137  K 3.8  CL 104  CO2 26  GLUCOSE 144*  BUN 14  CREATININE 0.74  CALCIUM 8.6*   ------------------------------------------------------------------------------------------------------------------  Cardiac Enzymes No results for input(s): TROPONINI in the last 168 hours. ------------------------------------------------------------------------------------------------------------------  RADIOLOGY:  CT CHEST WO CONTRAST Result Date: 07/14/2023 CLINICAL DATA:  Syncope. EXAM: CT CHEST WITHOUT CONTRAST TECHNIQUE: Multidetector CT imaging of the chest was performed following the standard protocol without IV contrast. RADIATION DOSE REDUCTION: This exam was performed according to the departmental  dose-optimization program which includes automated exposure control, adjustment of the mA and/or kV according to patient size and/or use of iterative reconstruction technique. COMPARISON:  None Available. FINDINGS: Cardiovascular: Atherosclerotic calcification of the aorta with age advanced involvement of the left anterior descending coronary artery. Enlarged pulmonic trunk and heart. No pericardial effusion. Mediastinum/Nodes: No pathologically enlarged mediastinal or axillary lymph nodes. Hilar regions are difficult to definitively evaluate without IV contrast. Esophagus is grossly unremarkable. Lungs/Pleura: Calcified granulomas. Minimal scattered pulmonary parenchymal scarring. No pleural fluid. Airway is unremarkable. Upper Abdomen: Visualized portions of the liver, gallbladder, adrenal glands, kidneys, spleen, pancreas, stomach and bowel are grossly unremarkable. No upper abdominal adenopathy. Musculoskeletal: Degenerative changes in the spine. IMPRESSION: 1. No acute findings. 2. Age advanced left anterior descending coronary artery calcification. 3.  Aortic atherosclerosis (ICD10-I70.0). Electronically Signed   By: Shearon Denis M.D.   On: 07/14/2023 17:18   DG Chest Port 1 View Result Date: 07/14/2023 CLINICAL DATA:  Syncope EXAM: PORTABLE CHEST 1 VIEW COMPARISON:  Jun 06, 2023 FINDINGS: Mild bilateral reticular interstitial infiltrates could correlate with congestive changes with mild cardiomegaly. No pleural effusions. IMPRESSION: Mild bilateral reticular interstitial infiltrates could correlate with congestive changes with mild cardiomegaly. Electronically Signed   By: Fredrich Jefferson M.D.   On: 07/14/2023 13:41      IMPRESSION AND PLAN:  Assessment and Plan: No notes have been filed under this hospital service. Service: Hospitalist      DVT prophylaxis: Lovenox ***  Advanced Care Planning:  Code Status: full code***  Family Communication:  The plan of care was discussed in details with  the patient (and family). I answered all questions. The patient agreed to proceed with the above mentioned plan. Further management will depend upon hospital course. Disposition Plan: Back to previous home environment Consults called: none***  All the records are reviewed and case discussed with ED provider.  Status is: Observation {Observation:23811}   At the time of the admission, it appears that the appropriate admission status for this patient is inpatient.  This is judged to be reasonable and necessary in order to provide the required intensity of service to ensure the patient's safety given the presenting symptoms, physical exam findings and initial radiographic and laboratory data in the context of comorbid conditions.  The patient requires inpatient status due to high intensity of service, high risk of further deterioration and high frequency of surveillance required.  I certify that at the time of admission, it is my clinical judgment that the patient will require inpatient hospital care extending more than 2 midnights.                            Dispo: The patient is from: Home              Anticipated d/c is to:  Home              Patient currently is not medically stable to d/c.              Difficult to place patient: No  Virgene Griffin M.D on 07/14/2023 at 9:06 PM  [facility]   From 7 PM-7 AM, contact night-coverage [email or web address]  CC: Primary care physician; Lyle San, MD

## 2023-07-14 NOTE — Progress Notes (Signed)
 PHARMACIST - PHYSICIAN COMMUNICATION  CONCERNING:  Enoxaparin  (Lovenox ) for DVT Prophylaxis   ASSESSMENT: Patient was prescribed enoxaparin  40 mg subcutaneously every 24 hours for VTE prophylaxis.   Body mass index is 37.61 kg/m.  Estimated Creatinine Clearance: 109.1 mL/min (by C-G formula based on SCr of 0.74 mg/dL).  Based on [facility] policy, patient qualifies for enoxaparin  dosing of 0.5 mg per kilogram of total body weight every 24 hours because their body mass index is >30 kg/m2.  PLAN: Pharmacy has adjusted enoxaparin  dose per  [facility] policy.  Description: Patient is now receiving enoxaparin  50 mg subcutaneously every 24 hours.  Will M. Alva Jewels, PharmD Clinical Pharmacist 07/14/2023 8:14 PM

## 2023-07-14 NOTE — ED Provider Notes (Signed)
 [facility] Provider Note    Event Date/Time   First MD Initiated Contact with Patient 07/14/23 1244     (approximate)   History   Weakness   HPI  Douglas Newton is a 63 y.o. male with a history of hypertension and epilepsy who presents with generalized weakness as well as cough and shortness of breath.  The patient states that he was treated for pneumonia about a month ago and the symptoms improved after antibiotics.  Subsequently yesterday he developed a worsened dry, nonproductive cough.  He also started develop some shortness of breath.  Today he started to feel very weak and lightheaded.  He states it feels similar to when he had the pneumonia.  He reports some left upper chest pain that also started this morning.  The patient felt like he was very close to passing out when he was in urgent care earlier this morning and started vomiting.  He denies any diarrhea.  He has no abdominal pain.  I reviewed the past medical records; the [facility] today after presenting with nausea and vomiting, cough, and shortness of breath.  He was sent to the ED for further evaluation given the possibility of pneumonia, ACS, or PE.   Physical Exam   Triage Vital Signs: ED Triage Vitals  Encounter Vitals Group     BP 07/14/23 1233 (!) 80/46     Girls Systolic BP Percentile --      Girls Diastolic BP Percentile --      Boys Systolic BP Percentile --      Boys Diastolic BP Percentile --      Pulse Rate 07/14/23 1233 (!) 59     Resp 07/14/23 1233 17     Temp 07/14/23 1233 98.2 F (36.8 C)     Temp Source 07/14/23 1233 Oral     SpO2 07/14/23 1233 91 %     Weight 07/14/23 1240 233 lb (105.7 kg)     Height 07/14/23 1240 5' 6 (1.676 m)     Head Circumference --      Peak Flow --      Pain Score 07/14/23 1240 8     Pain Loc --      Pain Education --      Exclude from Growth Chart --     Most recent vital signs: Vitals:   07/14/23 1430  07/14/23 1530  BP: (!) 158/74 133/69  Pulse: 89 89  Resp: (!) 29 20  Temp:    SpO2: 96% 96%     General: Alert, weak appearing, no distress.  CV:  Good peripheral perfusion.  Resp:  Normal effort.  Lungs with diffuse wheezing and rhonchi bilaterally.  No rales. Abd:  No distention.  Other:  Trace bilateral lower extremity edema.  Somewhat dry mucous membranes.   ED Results / Procedures / Treatments   Labs (all labs ordered are listed, but only abnormal results are displayed) Labs Reviewed  BASIC METABOLIC PANEL WITH GFR - Abnormal; Notable for the following components:      Result Value   Glucose, Bld 144 (*)    Calcium 8.6 (*)    All other components within normal limits  CBC - Abnormal; Notable for the following components:   WBC 13.0 (*)    All other components within normal limits  BRAIN NATRIURETIC PEPTIDE - Abnormal; Notable for the following components:   B Natriuretic Peptide 339.0 (*)    All  other components within normal limits  TROPONIN I (HIGH SENSITIVITY) - Abnormal; Notable for the following components:   Troponin I (High Sensitivity) 26 (*)    All other components within normal limits  TROPONIN I (HIGH SENSITIVITY) - Abnormal; Notable for the following components:   Troponin I (High Sensitivity) 24 (*)    All other components within normal limits  RESP PANEL BY RT-PCR (RSV, FLU A&B, COVID)  [id number]  LACTIC ACID, PLASMA  PROCALCITONIN  URINALYSIS, ROUTINE W REFLEX MICROSCOPIC     EKG  ED ECG REPORT I, Lind Repine, the attending physician, personally viewed and interpreted this ECG.  Date: 07/14/2023 EKG Time: 1237 Rate: 62 Rhythm: normal sinus rhythm QRS Axis: normal Intervals: normal ST/T Wave abnormalities: Nonspecific ST abnormalities Narrative Interpretation: no evidence of acute ischemia; no significant change when compared to EKG of 06/06/2023    RADIOLOGY  Chest x-ray: I independently viewed and interpreted the images; there is  some bilateral interstitial opacities with no focal consolidation  PROCEDURES:  Critical Care performed: No  Procedures   MEDICATIONS ORDERED IN ED: Medications  lactated ringers bolus 1,000 mL (0 mLs Intravenous Stopped 07/14/23 1445)     IMPRESSION / MDM / ASSESSMENT AND PLAN / ED COURSE  I reviewed the triage vital signs and the nursing notes.  63 year old male with PMH as noted above presents with nonproductive cough, some shortness of breath, chest pain, and acute onset of generalized weakness and lightheadedness this morning.  On arrival to the ED the patient was significant hypotensive, although the blood pressure has now improved without intervention.  Other vital signs are normal.  Lung exam reveals diffuse wheezing and rhonchi.  There is no significant peripheral edema.  Differential diagnosis includes, but is not limited to, pneumonia, acute bronchitis, COVID, influenza, or other viral syndrome, less likely primary cardiac cause.  There is no clinical evidence for PE at this time; the patient has no DVT symptoms.  I also have a low suspicion for aortic dissection or other vascular cause given the cough and abnormal lung sounds.  We will obtain chest x-ray, lab workup, give a fluid bolus, and reassess.  Patient's presentation is most consistent with acute presentation with potential threat to life or bodily function.  The patient is on the cardiac monitor to evaluate for evidence of arrhythmia and/or significant heart rate changes.   ----------------------------------------- 4:19 PM on 07/14/2023 -----------------------------------------  Chest x-ray shows bilateral interstitial opacities.  BNP is slightly elevated.  However, WBC count is also elevated.  The patient's clinical presentation seems more consistent with acute pneumonia or bronchitis rather than CHF.  I have ordered a CT of the chest to further elucidate.  Given the patient's initial hypotension and generalized  weakness, I feel he would likely benefit from inpatient admission.  I have signed him out to the oncoming ED physician Dr. Hendrick Locke.   FINAL CLINICAL IMPRESSION(S) / ED DIAGNOSES   Final diagnoses:  Shortness of breath     Rx / DC Orders   ED Discharge Orders     None        Note:  This document was prepared using Dragon voice recognition software and may include unintentional dictation errors.    Lind Repine, MD 07/14/23 [phone]

## 2023-07-14 NOTE — ED Notes (Signed)
 First nurse note: Pt here via [facility] from syncopal episode. EKG obtained at [facility] states HX of PNA and presents like this.   128/64 96% RA  CBG 159

## 2023-07-14 NOTE — H&P (Incomplete)
 [place]   PATIENT NAME: Douglas Newton    MR#:  [id number]  DATE OF BIRTH:  07/11/1960  DATE OF ADMISSION:  07/14/2023  PRIMARY CARE PHYSICIAN: Lyle San, MD   Patient is coming from: Home  REQUESTING/REFERRING PHYSICIAN: Marylynn Soho, MD  CHIEF COMPLAINT:   Chief Complaint  Patient presents with  . Weakness    HISTORY OF PRESENT ILLNESS:  Douglas Newton is a 63 y.o. African-American male with medical history significant for osteoarthritis, GERD, hypertension, dyslipidemia and seizure disorder, presented to the emergency room with acute onset of dyspnea since Sunday with associated orthopnea and occasional paroxysmal nocturnal dyspnea with generalized weakness and dizziness.  He admitted to having syncope in urgent care before coming here.  He had a pneumonia a month ago.  No fever or chills.  No dysuria, oliguria or hematuria or flank pain.  No paresthesias or focal muscle weakness.  No nausea or vomiting or abdominal pain.  No bleeding since.  ED Course: When the patient came to the ER, vital signs were within normal.  Labs revealed mild leukocytosis of 13 then 11.9.  UA was negative.  BMP was normal.  BNP was 339.  High-sensitivity troponin I was 26 and later 24. EKG as reviewed by me : EKG showed normal sinus rhythm with rate of 62 with T wave inversion inferiorly and laterally. Imaging: Chest x-ray showed mild bilateral reticular interstitial infiltrates cannot correlate with congestive changes with mild cardiomegaly. Chest CT without contrast revealed no acute findings.  Showed age advanced left anterior descending coronary artery calcification and aortic atherosclerosis.  The patient will be admitted to a cardiac telemetry observation bed for further evaluation and management. PAST MEDICAL HISTORY:   Past Medical History:  Diagnosis Date  . Arthritis 2011  . Chicken pox   . Colon polyp 01/15/2012  . Diarrhea   . GERD (gastroesophageal reflux  disease)   . Hernia 2007   admits 2006  . Hyperlipemia   . Hypertension admits 2011  . Obesity, unspecified   . Personal history of tobacco use, presenting hazards to health   . Screening for obesity   . Seizure disorder (HCC) 1965   admits 1964  . Syncope and collapse   . Ulcer 1975   admits 1974  . Umbilical hernia without mention of obstruction or gangrene   . Ventral hernia, unspecified, without mention of obstruction or gangrene     PAST SURGICAL HISTORY:   Past Surgical History:  Procedure Laterality Date  . ABDOMINAL SURGERY  1965  . arm surgery  1969  . COLONOSCOPY  01/15/2012   Dr Marquita Situ  . COLONOSCOPY N/A 03/28/2023   Procedure: COLONOSCOPY;  Surgeon: Quintin Buckle, DO;  Location: [facility] ENDOSCOPY;  Service: Gastroenterology;  Laterality: N/A;  . COLONOSCOPY WITH PROPOFOL  N/A 12/04/2016   Procedure: COLONOSCOPY WITH PROPOFOL ;  Surgeon: Marshall Skeeter, MD;  Location: [facility] ENDOSCOPY;  Service: Endoscopy;  Laterality: N/A;  . COLONOSCOPY WITH PROPOFOL  N/A 12/17/2019   Procedure: COLONOSCOPY WITH PROPOFOL ;  Surgeon: Marshall Skeeter, MD;  Location: [facility] ENDOSCOPY;  Service: Endoscopy;  Laterality: N/A;  . HERNIA REPAIR  December 2013   ventral hernia,umbilical hernia  . KNEE ARTHROSCOPY  October 1983   right knee  . KNEE SURGERY  October 2011   left knee  . LOBECTOMY  May 1965  . POLYPECTOMY  03/28/2023   Procedure: POLYPECTOMY;  Surgeon: Quintin Buckle, DO;  Location: [facility] ENDOSCOPY;  Service: Gastroenterology;;  .  stomach tube  1965  . tracheotomy  1965  . wires removed from under arm  1963    SOCIAL HISTORY:   Social History   Tobacco Use  . Smoking status: Some Days    Current packs/day: 0.00    Types: Cigars, Cigarettes  . Smokeless tobacco: Never  Substance Use Topics  . Alcohol use: Yes    Comment: rarely    FAMILY HISTORY:   Family History  Problem Relation Age of Onset  . Heart attack Maternal Grandfather   . Heart attack  Maternal Grandmother   . Heart attack Mother   . Brain cancer Mother   . Hypertension Paternal Grandfather   . Hypertension Paternal Grandmother     DRUG ALLERGIES:  No Known Allergies  REVIEW OF SYSTEMS:   ROS As per history of present illness. All pertinent systems were reviewed above. Constitutional, HEENT, cardiovascular, respiratory, GI, GU, musculoskeletal, neuro, psychiatric, endocrine, integumentary and hematologic systems were reviewed and are otherwise negative/unremarkable except for positive findings mentioned above in the HPI.   MEDICATIONS AT HOME:   Prior to Admission medications   Medication Sig Start Date End Date Taking? Authorizing Provider  albuterol (VENTOLIN HFA) 108 (90 Base) MCG/ACT inhaler Inhale 2 puffs into the lungs every 6 (six) hours as needed for shortness of breath or wheezing. 06/07/23 06/06/24 Yes [provider]  amoxicillin (AMOXIL) 500 MG capsule Take 1,000 mg by mouth every 8 (eight) hours. 06/07/23  Yes [provider]  carbamazepine  (TEGRETOL ) 200 MG tablet Take 200 mg by mouth 4 (four) times daily.   Yes [provider]  carvedilol (COREG) 12.5 MG tablet Take 12.5 mg by mouth 2 (two) times daily with a meal.   Yes [provider]  celecoxib (CELEBREX) 200 MG capsule Take 200 mg by mouth daily. 05/27/23  Yes [provider]  chlorpheniramine-HYDROcodone  (TUSSIONEX) 10-8 MG/5ML Take 5 mLs by mouth at bedtime as needed. 06/05/23  Yes [provider]  doxycycline (VIBRAMYCIN) 100 MG capsule Take 100 mg by mouth. 06/07/23  Yes [provider]  escitalopram (LEXAPRO) 10 MG tablet Take 10 mg by mouth daily.   Yes [provider]  hydrALAZINE  (APRESOLINE ) 50 MG tablet Take 50 mg by mouth 3 (three) times daily.   Yes [provider]  losartan  (COZAAR ) 100 MG tablet Take 100 mg by mouth daily.   Yes [provider]  minoxidil (LONITEN) 2.5 MG tablet Take by mouth daily.    Yes [provider]  ondansetron  (ZOFRAN -ODT) 4 MG disintegrating tablet Take 8 mg by mouth. 07/14/23 07/14/23 Yes [provider]  predniSONE (DELTASONE) 50 MG tablet Take 50 mg by mouth daily. 06/07/23  Yes [provider]  venlafaxine (EFFEXOR) 37.5 MG tablet Take 37.5 mg by mouth 2 (two) times daily.   Yes [provider]  fluticasone  (FLONASE ) 50 MCG/ACT nasal spray Place 2 sprays into both nostrils daily.    [provider]  hydrochlorothiazide (HYDRODIURIL) 25 MG tablet Take 25 mg by mouth daily. Patient not taking: Reported on 07/14/2023 03/07/19   [provider]  meclizine  (ANTIVERT ) 25 MG tablet Take 1 tablet (25 mg total) by mouth 3 (three) times daily as needed for dizziness. Patient not taking: Reported on 07/14/2023 01/11/23   Claria Crofts, MD  sildenafil (VIAGRA) 100 MG tablet Take 100 mg by mouth daily as needed for erectile dysfunction. Patient not taking: Reported on 07/14/2023    [provider]      VITAL SIGNS:  Blood pressure (!) 162/82, pulse 64, temperature 98.8 F (37.1 C), temperature source Oral, resp. rate 16, height 5' 6 (1.676 m), weight 105.7 kg, SpO2 98%.  PHYSICAL EXAMINATION:  Physical Exam  GENERAL:  62 y.o.-year-old second African American male patient lying in the bed with no acute distress.  EYES: Pupils equal, round, reactive to light and accommodation. No scleral icterus. Extraocular muscles intact.  HEENT: Head atraumatic, normocephalic. Oropharynx and nasopharynx clear.  NECK:  Supple, no jugular venous distention. No thyroid enlargement, no tenderness.  LUNGS: Diminished bibasilar breath sounds with mild bibasilar rales.. No use of accessory muscles of respiration.  CARDIOVASCULAR: Regular rate and rhythm, S1, S2 normal. No murmurs, rubs, or gallops.  ABDOMEN: Soft, nondistended, nontender. Bowel sounds present. No organomegaly or mass.  EXTREMITIES: 1+ bilateral lower extremity pitting edema,  with no cyanosis, or clubbing.  NEUROLOGIC: Cranial nerves II through XII are intact. Muscle strength 5/5 in all extremities. Sensation intact. Gait not checked.  PSYCHIATRIC: The patient is alert and oriented x 3.  Normal affect and good eye contact. SKIN: No obvious rash, lesion, or ulcer.   LABORATORY PANEL:   CBC Recent Labs  Lab 07/15/23 0516  WBC 8.8  HGB 12.3*  HCT 37.4*  PLT 262   ------------------------------------------------------------------------------------------------------------------  Chemistries  Recent Labs  Lab 07/15/23 0516  NA 135  K 3.9  CL 103  CO2 28  GLUCOSE 116*  BUN 14  CREATININE 0.64  CALCIUM 8.4*   ------------------------------------------------------------------------------------------------------------------  Cardiac Enzymes No results for input(s): TROPONINI in the last 168 hours. ------------------------------------------------------------------------------------------------------------------  RADIOLOGY:  CT CHEST WO CONTRAST Result Date: 07/14/2023 CLINICAL DATA:  Syncope. EXAM: CT CHEST WITHOUT CONTRAST TECHNIQUE: Multidetector CT imaging of the chest was performed following the standard protocol without IV contrast. RADIATION DOSE REDUCTION: This exam was performed according to the departmental dose-optimization program which includes automated exposure control, adjustment of the mA and/or kV according to patient size and/or use of iterative reconstruction technique. COMPARISON:  None Available. FINDINGS: Cardiovascular: Atherosclerotic calcification of the aorta with age advanced involvement of the left anterior descending coronary artery. Enlarged pulmonic trunk and heart. No pericardial effusion. Mediastinum/Nodes: No pathologically enlarged mediastinal or axillary lymph nodes. Hilar regions are difficult to definitively evaluate without IV contrast. Esophagus is grossly unremarkable. Lungs/Pleura: Calcified granulomas. Minimal  scattered pulmonary parenchymal scarring. No pleural fluid. Airway is unremarkable. Upper Abdomen: Visualized portions of the liver, gallbladder, adrenal glands, kidneys, spleen, pancreas, stomach and bowel are grossly unremarkable. No upper abdominal adenopathy. Musculoskeletal: Degenerative changes in the spine. IMPRESSION: 1. No acute findings. 2. Age advanced left anterior descending coronary artery calcification. 3.  Aortic atherosclerosis (ICD10-I70.0). Electronically Signed   By: Shearon Denis M.D.   On: 07/14/2023 17:18   DG Chest Port 1 View Result Date: 07/14/2023 CLINICAL DATA:  Syncope EXAM: PORTABLE CHEST 1 VIEW COMPARISON:  Jun 06, 2023 FINDINGS: Mild bilateral reticular interstitial infiltrates could correlate with congestive changes with mild cardiomegaly. No pleural effusions. IMPRESSION: Mild bilateral reticular interstitial infiltrates could correlate with congestive changes with mild cardiomegaly. Electronically Signed   By: Fredrich Jefferson M.D.   On: 07/14/2023 13:41      IMPRESSION AND PLAN:  Assessment and Plan: * Generalized weakness - This could be related to acute on chronic diastolic CHF. - The patient had a syncopal episode and was hypotensive initially and then improved after 1 L bolus of IV lactated ringer and 500 mL IV normal saline.  -The patient will be admitted to a cardiac telemetry  bed. - We will continue diuresis with IV Lasix. - We Will follow serial troponins. - Will check a 2D echo. - We will follow I's and O's and daily weights. - Cardiology consult be obtained.   - I notified Dr. Beau Bound about the patient. - Will check orthostatics. - Will monitor for arrhythmias given his syncope.    Depression - Will continue his antidepressants.  Essential hypertension - Will continue antihypertensive therapy.   DVT prophylaxis: Lovenox .  Advanced Care Planning:  Code Status: full code.  Family Communication:  The plan of care was discussed in details  with the patient (and family). I answered all questions. The patient agreed to proceed with the above mentioned plan. Further management will depend upon hospital course. Disposition Plan: Back to previous home environment Consults called: Cardiology All the records are reviewed and case discussed with ED provider.  Status is: Observation  I certify that at the time of admission, it is my clinical judgment that the patient will require hospital care extending less than 2 midnights.                            Dispo: The patient is from: Home              Anticipated d/c is to: Home              Patient currently is not medically stable to d/c.              Difficult to place patient: No  Virgene Griffin M.D on 07/15/2023 at 6:11 AM  [facility]   From 7 PM-7 AM, contact night-coverage [email or web address]  CC: Primary care physician; Lyle San, MD

## 2023-07-14 NOTE — ED Notes (Signed)
 Called CCMD for central monitoring at this time

## 2023-07-14 NOTE — ED Notes (Signed)
 Pt taken to ED 4 by EDT [facility]

## 2023-07-14 NOTE — ED Triage Notes (Signed)
 Pt comes with weakness. Pt went to [facility] and was brought over here. Pt states he has pneumonia in past and this is similar. Pt states sob and chest pain.

## 2023-07-14 NOTE — ED Notes (Signed)
 Pt reports dizziness when standing. Standing BP 157/88. MD aware.

## 2023-07-15 ENCOUNTER — Observation Stay: Admit: 2023-07-15 | Discharge: 2023-07-15 | Disposition: A | Discharge status: Home / Self Care

## 2023-07-15 ENCOUNTER — Other Ambulatory Visit (HOSPITAL_COMMUNITY): Payer: Self-pay

## 2023-07-15 ENCOUNTER — Telehealth (HOSPITAL_COMMUNITY): Payer: Self-pay | Admitting: Pharmacy Technician

## 2023-07-15 DIAGNOSIS — R531 Weakness: Secondary | ICD-10-CM | POA: Diagnosis not present

## 2023-07-15 DIAGNOSIS — Z8249 Family history of ischemic heart disease and other diseases of the circulatory system: Secondary | ICD-10-CM | POA: Diagnosis not present

## 2023-07-15 DIAGNOSIS — Z79899 Other long term (current) drug therapy: Secondary | ICD-10-CM | POA: Diagnosis not present

## 2023-07-15 DIAGNOSIS — I959 Hypotension, unspecified: Secondary | ICD-10-CM | POA: Diagnosis present

## 2023-07-15 DIAGNOSIS — I5033 Acute on chronic diastolic (congestive) heart failure: Secondary | ICD-10-CM

## 2023-07-15 DIAGNOSIS — F32A Depression, unspecified: Secondary | ICD-10-CM

## 2023-07-15 DIAGNOSIS — I700 Atherosclerosis of aorta: Secondary | ICD-10-CM | POA: Diagnosis present

## 2023-07-15 DIAGNOSIS — Z808 Family history of malignant neoplasm of other organs or systems: Secondary | ICD-10-CM | POA: Diagnosis not present

## 2023-07-15 DIAGNOSIS — Z6837 Body mass index (BMI) 37.0-37.9, adult: Secondary | ICD-10-CM | POA: Diagnosis not present

## 2023-07-15 DIAGNOSIS — Z860100 Personal history of colon polyps, unspecified: Secondary | ICD-10-CM | POA: Diagnosis not present

## 2023-07-15 DIAGNOSIS — R0602 Shortness of breath: Secondary | ICD-10-CM | POA: Diagnosis present

## 2023-07-15 DIAGNOSIS — E669 Obesity, unspecified: Secondary | ICD-10-CM | POA: Diagnosis present

## 2023-07-15 DIAGNOSIS — I110 Hypertensive heart disease with heart failure: Secondary | ICD-10-CM | POA: Diagnosis present

## 2023-07-15 DIAGNOSIS — J4489 Other specified chronic obstructive pulmonary disease: Secondary | ICD-10-CM | POA: Diagnosis present

## 2023-07-15 DIAGNOSIS — I2489 Other forms of acute ischemic heart disease: Secondary | ICD-10-CM | POA: Diagnosis present

## 2023-07-15 DIAGNOSIS — Z791 Long term (current) use of non-steroidal anti-inflammatories (NSAID): Secondary | ICD-10-CM | POA: Diagnosis not present

## 2023-07-15 DIAGNOSIS — I2510 Atherosclerotic heart disease of native coronary artery without angina pectoris: Secondary | ICD-10-CM | POA: Diagnosis present

## 2023-07-15 DIAGNOSIS — E785 Hyperlipidemia, unspecified: Secondary | ICD-10-CM | POA: Diagnosis present

## 2023-07-15 DIAGNOSIS — G40909 Epilepsy, unspecified, not intractable, without status epilepticus: Secondary | ICD-10-CM | POA: Diagnosis present

## 2023-07-15 DIAGNOSIS — K219 Gastro-esophageal reflux disease without esophagitis: Secondary | ICD-10-CM | POA: Diagnosis present

## 2023-07-15 DIAGNOSIS — I10 Essential (primary) hypertension: Secondary | ICD-10-CM | POA: Diagnosis not present

## 2023-07-15 DIAGNOSIS — Z1152 Encounter for screening for COVID-19: Secondary | ICD-10-CM | POA: Diagnosis not present

## 2023-07-15 DIAGNOSIS — Z7951 Long term (current) use of inhaled steroids: Secondary | ICD-10-CM | POA: Diagnosis not present

## 2023-07-15 DIAGNOSIS — Z87891 Personal history of nicotine dependence: Secondary | ICD-10-CM | POA: Diagnosis not present

## 2023-07-15 DIAGNOSIS — J9811 Atelectasis: Secondary | ICD-10-CM | POA: Diagnosis present

## 2023-07-15 LAB — BASIC METABOLIC PANEL WITH GFR
Anion gap: 4 — ABNORMAL LOW (ref 5–15)
BUN: 14 mg/dL (ref 8–23)
CO2: 28 mmol/L (ref 22–32)
Calcium: 8.4 mg/dL — ABNORMAL LOW (ref 8.9–10.3)
Chloride: 103 mmol/L (ref 98–111)
Creatinine, Ser: 0.64 mg/dL (ref 0.61–1.24)
GFR, Estimated: >60 mL/min (ref >60)
Glucose, Bld: 116 mg/dL — ABNORMAL HIGH (ref 70–99)
Potassium: 3.9 mmol/L (ref 3.5–5.1)
Sodium: 135 mmol/L (ref 135–145)

## 2023-07-15 LAB — CBC
HCT: 37.4 % — ABNORMAL LOW (ref 39.0–52.0)
Hemoglobin: 12.3 g/dL — ABNORMAL LOW (ref 13.0–17.0)
MCH: 29.4 pg (ref 26.0–34.0)
MCHC: 32.9 g/dL (ref 30.0–36.0)
MCV: 89.3 fL (ref 80.0–100.0)
Platelets: 262 10*3/uL (ref 150–400)
RBC: 4.19 MIL/uL — ABNORMAL LOW (ref 4.22–5.81)
RDW: 12.6 % (ref 11.5–15.5)
WBC: 8.8 10*3/uL (ref 4.0–10.5)
nRBC: 0 % (ref 0.0–0.2)

## 2023-07-15 LAB — ECHOCARDIOGRAM COMPLETE
Area-P 1/2: 2.74 cm2
Height: 66 in
S' Lateral: 3.01 cm
Weight: 3728 [oz_av]

## 2023-07-15 LAB — CBG MONITORING, ED: Glucose-Capillary: 134 mg/dL — ABNORMAL HIGH (ref 70–99)

## 2023-07-15 LAB — HIV ANTIBODY (ROUTINE TESTING W REFLEX): HIV Screen 4th Generation wRfx: NONREACTIVE

## 2023-07-15 MED ORDER — FUROSEMIDE 10 MG/ML IJ SOLN
40.0000 mg | Freq: Two times a day (BID) | INTRAMUSCULAR | Status: DC
  Administered 2023-07-15 – 2023-07-17 (×4): 40 mg via INTRAVENOUS
  Filled 2023-07-15 (×4): qty 4

## 2023-07-15 MED ORDER — SPIRONOLACTONE 12.5 MG HALF TABLET
12.5000 mg | ORAL_TABLET | Freq: Every day | ORAL | Status: DC
  Administered 2023-07-15 – 2023-07-16 (×2): 12.5 mg via ORAL
  Filled 2023-07-15 (×2): qty 1

## 2023-07-15 MED ORDER — FLUTICASONE PROPIONATE 50 MCG/ACT NA SUSP
2.0000 | Freq: Every day | NASAL | Status: DC
  Administered 2023-07-15 – 2023-07-17 (×3): 2 via NASAL
  Filled 2023-07-15: qty 16

## 2023-07-15 MED ORDER — MIDODRINE HCL 5 MG PO TABS
10.0000 mg | ORAL_TABLET | Freq: Three times a day (TID) | ORAL | Status: DC
  Administered 2023-07-15 (×2): 10 mg via ORAL
  Filled 2023-07-15 (×3): qty 2

## 2023-07-15 MED ORDER — FUROSEMIDE 10 MG/ML IJ SOLN
20.0000 mg | Freq: Two times a day (BID) | INTRAMUSCULAR | Status: DC
  Administered 2023-07-15: 20 mg via INTRAVENOUS
  Filled 2023-07-15: qty 4

## 2023-07-15 MED ORDER — CELECOXIB 200 MG PO CAPS
200.0000 mg | ORAL_CAPSULE | Freq: Every day | ORAL | Status: DC
  Administered 2023-07-16 – 2023-07-17 (×2): 200 mg via ORAL
  Filled 2023-07-15 (×3): qty 1

## 2023-07-15 NOTE — Progress Notes (Addendum)
 Heart Failure Stewardship Pharmacy Note  PCP: Lyle San, MD PCP-Cardiologist: None  HPI: Douglas Newton is a 63 y.o. male with osteoarthritis, GERD, hypertension, dyslipidemia and seizure disorder who presented with dyspnea, orthopnea, occasional PND, dizziness, and generalized weakness. On admission, BNP was 339, HS-troponin was 24, PCT <0.1 , d-dimer 0.39, and lactic acid of 1.5. Chest x-ray noted congestive changes.    Pertinent cardiac history: Echo in 03/2015 noted LVEF of 65-70% with moderate LVH. Echo in 03/2019 noted LVEF >75%, moderate asymmetric LVH of the septal segment, with grade I diastolic dysfunction. Echo ordered this admission.  Pertinent Lab Values: Creatinine  Date Value Ref Range Status  01/28/2012 0.83 0.60 - 1.30 mg/dL Final   Creatinine, Ser  Date Value Ref Range Status  07/15/2023 0.64 0.61 - 1.24 mg/dL Final   BUN  Date Value Ref Range Status  07/15/2023 14 8 - 23 mg/dL Final  46/96/2952 15 7 - 18 mg/dL Final   Potassium  Date Value Ref Range Status  07/15/2023 3.9 3.5 - 5.1 mmol/L Final  01/28/2012 4.3 3.5 - 5.1 mmol/L Final   Sodium  Date Value Ref Range Status  07/15/2023 135 135 - 145 mmol/L Final  01/28/2012 140 136 - 145 mmol/L Final   B Natriuretic Peptide  Date Value Ref Range Status  07/14/2023 339.0 (H) 0.0 - 100.0 pg/mL Final    Comment:    Performed at [facility], [address] [place], [place] [address]   Magnesium  Date Value Ref Range Status  04/13/2019 2.0 1.7 - 2.4 mg/dL Final    Comment:    Performed at [facility], [address] [place], [place] [address]   TSH  Date Value Ref Range Status  04/13/2019 1.706 0.350 - 4.500 uIU/mL Final    Comment:    Performed by a 3rd Generation assay with a functional sensitivity of <=0.01 uIU/mL. Performed at [facility], [address] [place], [place] [address]     Vital Signs:  Temp:  [98.2 F (36.8 C)-99.3 F (37.4 C)] 98.7 F  (37.1 C) (06/17 0752) Pulse Rate:  [47-96] 68 (06/17 0830) Cardiac Rhythm: Sinus bradycardia (06/17 0735) Resp:  [14-29] 18 (06/17 0830) BP: (80-167)/(43-83) 157/82 (06/17 0830) SpO2:  [89 %-99 %] 97 % (06/17 0830) Weight:  [105.7 kg (233 lb)] 105.7 kg (233 lb) (06/16 1240)  Intake/Output Summary (Last 24 hours) at 07/15/2023 1028 Last data filed at 07/14/2023 1445 Gross per 24 hour  Intake 1000 ml  Output --  Net 1000 ml   Current Heart Failure Medications:  Loop diuretic: furosemide 40 mg IV BID Beta-Blocker: carvedilol 12.5 mg BID ACEI/ARB/ARNI: losartan  100 mg daily MRA: none SGLT2i: none Other: minoxidil 2.5 mg daily, hydralazine  50 mg TID, midodrine 10 mg TID  Prior to admission Heart Failure Medications:  Loop diuretic: none Beta-Blocker: carvedilol 12.5 mg BID ACEI/ARB/ARNI: losartan  100 mg daily MRA: none SGLT2i: none Other: minoxidil 2.5 mg daily, hydralazine  50 mg TID  Assessment: 1. Acute on chronic diastolic heart failure (LVEF in 2021 of >75%)  , due to unknown etiology. NYHA class III symptoms.  -Symptoms: Patient reports feeling fine laying with head elevated in bed. Reports wheezing, but no shortness of breath at rest. Positive for orthopnea.   -Volume: Appears to be volume up with mild LEE and orthopnea. Urine color is clear, pale yellow. Currently reports good output on furosemide 40 mg IV BID. -Hemodynamics: BP is highly variable, with two episodes of hypotension and bradycardia.  Patient reports he can feel it coming, but has no inciting factors. Potentially vasovagal? Not severely hypertensive on midodrine and multiple anti-hypertensives. -BB: Currently on carvedilol 12.5 mg BID. Given recent bradycardia associated with hypotension, would recommend decreasing to 6.25 mg BID for the time being. -ACEI/ARB/ARNI: Currently on losartan  100 mg daily. Would Ideally transition to Entresto for greater antihypertensive efficacy to eliminate the need for hydralazine .   -MRA: Consider adding spironolactone.   -SGLT2i: Consider adding SGLT2i with known diastolic dysfunction -Midodrine started during hypotensive episode. Patient is now hypertensive and midodrine should be held to prevent further iatrogenic hypotension. Would prefer deescalation of antihypertensives to addition of midodrine. -Consider stopping hydralazine  if BP is controlled after transition to Entresto.   Plan: 1) Medication changes recommended at this time: -Consider stopping midodrine given hypertension -Consider reducing carvedilol to 6.25 mg BID given hypotensive episodes are associated with bradycardia. -Consider adding spironolactone 25 mg daily -Consider adding Farxiga or Jardiance 10 mg daily -Can consider transition from losartan  to [facility] tomorrow if BP stable off midodrine.  2) Patient assistance: -Viola Greulich copay is $76 -[place] and Jardiance copays are $37.50 -All copays will be $10 or less with copay cards  3) Education: - Patient has been educated on current HF medications and potential additions to HF medication regimen - Patient verbalizes understanding that over the next few months, these medication doses may change and more medications may be added to optimize HF regimen - Patient has been educated on basic disease state pathophysiology and goals of therapy  Medication Assistance / Insurance Benefits Check: Does the patient have prescription insurance?    Type of insurance plan:  Does the patient qualify for medication assistance through manufacturers or grants? No   Outpatient Pharmacy: Prior to admission outpatient pharmacy: [facility]      Please do not hesitate to reach out with questions or concerns,  Bevely Brush, PharmD, CPP, BCPS Heart Failure Pharmacist  Phone - [phone] 07/15/2023 10:28 AM

## 2023-07-15 NOTE — ED Notes (Signed)
Informed RN bed assigned 

## 2023-07-15 NOTE — Progress Notes (Signed)
 [facility]  - [place] [facility]   PATIENT NAME: Douglas Newton    MR#:  [id number]  DATE OF BIRTH:  07-07-1960  SUBJECTIVE:  patient seen in the ER. No family at bedside. Patient apparently came in with syncopal episode without any trauma found to be in congestive heart failure. Overall feels better getting IV Lasix. Heartrate was bit on the lower side 47 to-- 50s beta-blocker today. Denies any chest pain.    VITALS:  Blood pressure (!) 154/97, pulse (!) 59, temperature 98.4 F (36.9 C), temperature source Oral, resp. rate 20, height 5' 6 (1.676 m), weight 106.3 kg, SpO2 93%.  PHYSICAL EXAMINATION:   GENERAL:  63 y.o.-year-old patient with no acute distress.  LUNGS: Normal breath sounds bilaterally, no wheezing CARDIOVASCULAR: S1, S2 normal. No murmur   ABDOMEN: Soft, nontender, nondistended. Bowel sounds present.  EXTREMITIES: ++ edema b/l.    NEUROLOGIC: nonfocal  patient is alert and awake   LABORATORY PANEL:  CBC Recent Labs  Lab 07/15/23 0516  WBC 8.8  HGB 12.3*  HCT 37.4*  PLT 262    Chemistries  Recent Labs  Lab 07/15/23 0516  NA 135  K 3.9  CL 103  CO2 28  GLUCOSE 116*  BUN 14  CREATININE 0.64  CALCIUM 8.4*   Cardiac Enzymes No results for input(s): TROPONINI in the last 168 hours. RADIOLOGY:  CT CHEST WO CONTRAST Result Date: 07/14/2023 CLINICAL DATA:  Syncope. EXAM: CT CHEST WITHOUT CONTRAST TECHNIQUE: Multidetector CT imaging of the chest was performed following the standard protocol without IV contrast. RADIATION DOSE REDUCTION: This exam was performed according to the departmental dose-optimization program which includes automated exposure control, adjustment of the mA and/or kV according to patient size and/or use of iterative reconstruction technique. COMPARISON:  None Available. FINDINGS: Cardiovascular: Atherosclerotic calcification of the aorta with age advanced involvement of the left anterior descending coronary  artery. Enlarged pulmonic trunk and heart. No pericardial effusion. Mediastinum/Nodes: No pathologically enlarged mediastinal or axillary lymph nodes. Hilar regions are difficult to definitively evaluate without IV contrast. Esophagus is grossly unremarkable. Lungs/Pleura: Calcified granulomas. Minimal scattered pulmonary parenchymal scarring. No pleural fluid. Airway is unremarkable. Upper Abdomen: Visualized portions of the liver, gallbladder, adrenal glands, kidneys, spleen, pancreas, stomach and bowel are grossly unremarkable. No upper abdominal adenopathy. Musculoskeletal: Degenerative changes in the spine. IMPRESSION: 1. No acute findings. 2. Age advanced left anterior descending coronary artery calcification. 3.  Aortic atherosclerosis (ICD10-I70.0). Electronically Signed   By: Shearon Denis M.D.   On: 07/14/2023 17:18   DG Chest Port 1 View Result Date: 07/14/2023 CLINICAL DATA:  Syncope EXAM: PORTABLE CHEST 1 VIEW COMPARISON:  Jun 06, 2023 FINDINGS: Mild bilateral reticular interstitial infiltrates could correlate with congestive changes with mild cardiomegaly. No pleural effusions. IMPRESSION: Mild bilateral reticular interstitial infiltrates could correlate with congestive changes with mild cardiomegaly. Electronically Signed   By: Fredrich Jefferson M.D.   On: 07/14/2023 13:41    Assessment and Plan  Douglas Newton is a 63 y.o. African-American male with medical history significant for osteoarthritis, GERD, hypertension, dyslipidemia and seizure disorder, presented to the emergency room with acute onset of dyspnea since Sunday with associated orthopnea and occasional paroxysmal nocturnal dyspnea with generalized weakness and dizziness.   Chest x-ray showed mild bilateral reticular interstitial infiltrates cannot correlate with congestive changes with mild cardiomegaly. Chest CT without contrast revealed no acute findings.  Showed age advanced left anterior descending coronary artery  calcification and aortic atherosclerosis.  BNP was  339.   Generalized weakness Acute on chronic diastolic CHF - The patient had a syncopal episode and was hypotensive initially and then improved after 1 L bolus of IV lactated ringer and 500 mL IV normal saline. -  continue diuresis with IV Lasix. - Cardiac enzymes flat -  2D echo. Done results pending - follow I's and O's and daily weights. - Cardiology consult with [facility] --GDMT per cardiology    Depression -  continue his antidepressants.   Essential hypertension - continue Coreg, hydralazine , losartan   History of seizure disorder -- continue carbamazepine      DVT prophylaxis: Lovenox .  Advanced Care Planning:  Code Status: full code.  Family Communication: no family at bedside  Consults : [facility] cardiology Level of care: Telemetry Cardiac Status is: Inpatient Remains inpatient appropriate because: new onset congestive heart failure    TOTAL TIME TAKING CARE OF THIS PATIENT: 45 minutes.  >50% time spent on counselling and coordination of care  Note: This dictation was prepared with Dragon dictation along with smaller phrase technology. Any transcriptional errors that result from this process are unintentional.  [name] M.D    [facility]   CC: Primary care physician; Lyle San, MD

## 2023-07-15 NOTE — Assessment & Plan Note (Addendum)
-   This could be related to acute on chronic diastolic CHF. - The patient had a syncopal episode and was hypotensive initially and then improved after 1 L bolus of IV lactated ringer and 500 mL IV normal saline.  -The patient will be admitted to a cardiac telemetry bed. - We will continue diuresis with IV Lasix. - We Will follow serial troponins. - Will check a 2D echo. - We will follow I's and O's and daily weights. - Cardiology consult be obtained.   - I notified Dr. Beau Bound about the patient. - Will check orthostatics. - Will monitor for arrhythmias given his syncope.

## 2023-07-15 NOTE — Assessment & Plan Note (Signed)
Will continue antihypertensive therapy.

## 2023-07-15 NOTE — Assessment & Plan Note (Signed)
-   Will continue his antidepressants.

## 2023-07-15 NOTE — Plan of Care (Signed)

## 2023-07-15 NOTE — Consult Note (Signed)
 [facility] CARDIOLOGY CONSULT NOTE       Patient ID: Douglas Newton MRN: [id number] DOB/AGE: Mar 06, 1960 63 y.o.  Admit date: 07/14/2023 Referring Physician Douglas Newton Primary Physician Douglas San, MD Primary Cardiologist Douglas Newton Reason for Consultation Acute CHF  HPI: Douglas Newton is a 63 y.o. male  with a past medical history of hx of syncope, chronic HFpEF , hypertension, hyperlipidemia, severe LVH who presented to the ED on 07/14/2023 for acute onset dyspnea associated with orthopnea.  Patient has no known history of heart failure.  Cardiology was consulted for further evaluation.   Patient presented to the ED with shortness of breath and orthopnea. Work up in the ED notable for sodium 137, potassium 3.8, creatinine 0.74, hemoglobin 13, platelets 304.  Lactate within normal limits.  CXR with pulmonary vascular congestion with mild cardiomegaly.  BNP elevated at 330.  CT revealed age advanced LAD calcification, aortic atherosclerosis. Troponin was minimally elevated and flat 26 >24. EKG with sinus rhythm rate 62 bpm without acute ischemic changes.  Patient given IV fluids and assumed on some home medications.  At the time of my evaluation this AM, patient was resting in hospital bed and that incline.  Discussed patient's symptoms in further detail.  Patient states he came into the ED due to shortness of breath, orthopnea and after having a syncopal episode due to low blood pressures.  Patient denies any chest pain or noticed any swelling.    Review of systems complete and found to be negative unless listed above    Past Medical History:  Diagnosis Date   Arthritis 2011   Chicken pox    Colon polyp 01/15/2012   Diarrhea    GERD (gastroesophageal reflux disease)    Hernia 2007   admits 2006   Hyperlipemia    Hypertension admits 2011   Obesity, unspecified    Personal history of tobacco use, presenting hazards to health    Screening for obesity    Seizure  disorder (HCC) 1965   admits 1964   Syncope and collapse    Ulcer 1975   admits 1974   Umbilical hernia without mention of obstruction or gangrene    Ventral hernia, unspecified, without mention of obstruction or gangrene     Past Surgical History:  Procedure Laterality Date   ABDOMINAL SURGERY  1965   arm surgery  1969   COLONOSCOPY  01/15/2012   Dr Douglas Newton   COLONOSCOPY N/A 03/28/2023   Procedure: COLONOSCOPY;  Surgeon: Douglas Buckle, DO;  Location: [facility] ENDOSCOPY;  Service: Gastroenterology;  Laterality: N/A;   COLONOSCOPY WITH PROPOFOL  N/A 12/04/2016   Procedure: COLONOSCOPY WITH PROPOFOL ;  Surgeon: Douglas Skeeter, MD;  Location: [facility] ENDOSCOPY;  Service: Endoscopy;  Laterality: N/A;   COLONOSCOPY WITH PROPOFOL  N/A 12/17/2019   Procedure: COLONOSCOPY WITH PROPOFOL ;  Surgeon: Douglas Skeeter, MD;  Location: [facility] ENDOSCOPY;  Service: Endoscopy;  Laterality: N/A;   HERNIA REPAIR  December 2013   ventral hernia,umbilical hernia   KNEE ARTHROSCOPY  October 1983   right knee   KNEE SURGERY  October 2011   left knee   LOBECTOMY  May 1965   POLYPECTOMY  03/28/2023   Procedure: POLYPECTOMY;  Surgeon: Douglas Buckle, DO;  Location: [facility] ENDOSCOPY;  Service: Gastroenterology;;   stomach tube  1965   tracheotomy  1965   wires removed from under arm  1963    (Not in a hospital admission)  Social History   Socioeconomic History   Marital  status: Widowed    Spouse name: Not on file   Number of children: Not on file   Years of education: Not on file   Highest education level: Not on file  Occupational History   Not on file  Tobacco Use   Smoking status: Some Days    Current packs/day: 0.00    Types: Cigars, Cigarettes   Smokeless tobacco: Never  Vaping Use   Vaping status: Never Used  Substance and Sexual Activity   Alcohol use: Yes    Comment: rarely   Drug use: No   Sexual activity: Not on file  Other Topics Concern   Not on file  Social History  Narrative   ** Merged History Encounter **       Social Drivers of Health   Financial Resource Strain: Low Risk  (05/29/2023)   [facility] from [facility]   Overall Financial Resource Strain (CARDIA)    Difficulty of Paying Living Expenses: Not hard at all  Food Insecurity: No Food Insecurity (05/29/2023)   Received from [facility]   Hunger Vital Sign    Within the past 12 months, you worried that your food would run out before you got the money to buy more.: Never true    Within the past 12 months, the food you bought just didn't last and you didn't have money to get more.: Never true  Transportation Needs: No Transportation Needs (05/29/2023)   Received from [facility] - Transportation    In the past 12 months, has lack of transportation kept you from medical appointments or from getting medications?: No    Lack of Transportation (Non-Medical): No  Physical Activity: Not on file  Stress: Not on file  Social Connections: Not on file  Intimate Partner Violence: Not on file    Family History  Problem Relation Age of Onset   Heart attack Maternal Grandfather    Heart attack Maternal Grandmother    Heart attack Mother    Brain cancer Mother    Hypertension Paternal Grandfather    Hypertension Paternal Grandmother      Vitals:   07/15/23 0130 07/15/23 0300 07/15/23 0400 07/15/23 0500  BP: 139/79 (!) 154/83 (!) 167/75 (!) 162/82  Pulse: 72 78 66 64  Resp: 17 19 16 16   Temp:   98.8 F (37.1 C)   TempSrc:   Oral   SpO2: 98% 96% 98% 98%  Weight:      Height:        PHYSICAL EXAM General: Well-appearing male, well nourished, in no acute distress. HEENT: Normocephalic and atraumatic. Neck: No JVD.   Lungs: Normal respiratory effort on room air. Wheezing diffusely Heart: HRRR. Normal S1 and S2 without gallops or murmurs.  Abdomen: Non-distended appearing.  Msk: Normal strength and tone for age. Extremities: Warm  and well perfused. No clubbing, cyanosis. Trace bilateral edema.  Neuro: Alert and oriented X 3. Psych: Answers questions appropriately.   Labs: Basic Metabolic Panel: Recent Labs    07/14/23 1250 07/14/23 2112 07/15/23 0516  NA 137  --  135  K 3.8  --  3.9  CL 104  --  103  CO2 26  --  28  GLUCOSE 144*  --  116*  BUN 14  --  14  CREATININE 0.74 0.62 0.64  CALCIUM 8.6*  --  8.4*   Liver Function Tests: No results for input(s): AST, ALT, ALKPHOS, BILITOT, PROT, ALBUMIN in the last  72 hours. No results for input(s): LIPASE, AMYLASE in the last 72 hours. CBC: Recent Labs    07/14/23 2112 07/15/23 0516  WBC 11.9* 8.8  HGB 13.0 12.3*  HCT 38.9* 37.4*  MCV 88.8 89.3  PLT 280 262   Cardiac Enzymes: Recent Labs    07/14/23 1250 07/14/23 1446  TROPONINIHS 26* 24*   BNP: Recent Labs    07/14/23 1250  BNP 339.0*   D-Dimer: Recent Labs    07/14/23 1250  DDIMER 0.39   Hemoglobin A1C: No results for input(s): HGBA1C in the last 72 hours. Fasting Lipid Panel: No results for input(s): CHOL, HDL, LDLCALC, TRIG, CHOLHDL, LDLDIRECT in the last 72 hours. Thyroid Function Tests: No results for input(s): TSH, T4TOTAL, T3FREE, THYROIDAB in the last 72 hours.  Invalid input(s): FREET3 Anemia Panel: No results for input(s): VITAMINB12, FOLATE, FERRITIN, TIBC, IRON, RETICCTPCT in the last 72 hours.   Radiology: CT CHEST WO CONTRAST Result Date: 07/14/2023 CLINICAL DATA:  Syncope. EXAM: CT CHEST WITHOUT CONTRAST TECHNIQUE: Multidetector CT imaging of the chest was performed following the standard protocol without IV contrast. RADIATION DOSE REDUCTION: This exam was performed according to the departmental dose-optimization program which includes automated exposure control, adjustment of the mA and/or kV according to patient size and/or use of iterative reconstruction technique. COMPARISON:  None Available. FINDINGS:  Cardiovascular: Atherosclerotic calcification of the aorta with age advanced involvement of the left anterior descending coronary artery. Enlarged pulmonic trunk and heart. No pericardial effusion. Mediastinum/Nodes: No pathologically enlarged mediastinal or axillary lymph nodes. Hilar regions are difficult to definitively evaluate without IV contrast. Esophagus is grossly unremarkable. Lungs/Pleura: Calcified granulomas. Minimal scattered pulmonary parenchymal scarring. No pleural fluid. Airway is unremarkable. Upper Abdomen: Visualized portions of the liver, gallbladder, adrenal glands, kidneys, spleen, pancreas, stomach and bowel are grossly unremarkable. No upper abdominal adenopathy. Musculoskeletal: Degenerative changes in the spine. IMPRESSION: 1. No acute findings. 2. Age advanced left anterior descending coronary artery calcification. 3.  Aortic atherosclerosis (ICD10-I70.0). Electronically Signed   By: Shearon Denis M.D.   On: 07/14/2023 17:18   DG Chest Port 1 View Result Date: 07/14/2023 CLINICAL DATA:  Syncope EXAM: PORTABLE CHEST 1 VIEW COMPARISON:  Jun 06, 2023 FINDINGS: Mild bilateral reticular interstitial infiltrates could correlate with congestive changes with mild cardiomegaly. No pleural effusions. IMPRESSION: Mild bilateral reticular interstitial infiltrates could correlate with congestive changes with mild cardiomegaly. Electronically Signed   By: Fredrich Jefferson M.D.   On: 07/14/2023 13:41    ECHO ordered  TELEMETRY reviewed by me 07/15/2023: Sinus rhythm rate 60s  EKG reviewed by me: sinus rhythm rate 62 bpm without acute ischemic changes.   Data reviewed by me 07/15/2023: last 24h vitals tele labs imaging I/O ED provider note, admission H&P.  Principal Problem:   Generalized weakness Active Problems:   Essential hypertension   Depression   Acute on chronic diastolic CHF (congestive heart failure) (HCC)    ASSESSMENT AND PLAN:  Douglas Newton is a 63 y.o. male  with a  past medical history of hx of syncope, chronic HFpEF, hypertension, hyperlipidemia, severe LVH who presented to the ED on 07/14/2023 for acute onset dyspnea associated with orthopnea.  Patient has no known history of heart failure.  Cardiology was consulted for further evaluation.   # Acute on chronic HFpEF exacerbation # Demand ischemia # Hypertension # Hyperlipidemia Patient presents with worsening shortness of breath and orthopnea. Patient reports having a syncopal episode when his BP dropped. CXR with pulmonary vascular congestion with  mild cardiomegaly.  BNP elevated at 330.  CT revealed age advanced LAD calcification, aortic atherosclerosis. Troponin was minimally elevated and flat 26 >24. EKG with sinus rhythm rate 62 bpm without acute ischemic changes.  Patient without chest pain. -Echo ordered.  Further recommendations pending results. -Monitor and replenish electrolytes for a goal K >4, Mag >2  -Ordered IV lasix 40 mg BID.  Closely monitor renal function, electrolytes, UOP. -Continue Coreg 12.5 mg twice daily. -Continue losartan  100 mg daily. Viola Greulich copay [phone]) -Ordered spironolactone 12.5 mg daily.  Uptitrate as BP allows. -Consider SGLT2 for GDMT optimization.        Elvina Hammers and Jardiance copays $37.50)         (All copays will be $10 or less with co-pay cards -per pharmacy) -Elevated and flat tropes in the setting of acute heart failure exacerbation is most consistent with demand/supply mismatch and not ACS. -Consider monitor placement at discharge due to syncopal event prior to admission.    This patient's plan of care was discussed and created with Dr. Beau Bound and he is in agreement.  Signed: Creighton Doffing, PA-C  07/15/2023, 7:09 AM [facility] Cardiology

## 2023-07-15 NOTE — Telephone Encounter (Signed)
 Patient Product/process development scientist completed.    The patient is insured through [facility]. Patient has [employer], may use a copay card, and/or apply for patient assistance if available.    Ran test claim for Entresto 24-26 mg and the current 30 day co-pay is $60.00.  Ran test claim for Farxiga 10 mg and the current 30 day co-pay is $37.50.  Ran test claim for Jardiance 10 mg and the current 30 day co-pay is $37.50.  This test claim was processed through [facility]- copay amounts may vary at other pharmacies due to pharmacy/plan contracts, or as the patient moves through the different stages of their insurance plan.     Morgan Arab, CPHT Pharmacy Technician III Certified Patient Advocate [facility] Pharmacy Patient Advocate Team Direct Number: [phone]  Fax: [phone]

## 2023-07-15 NOTE — Progress Notes (Signed)
 Heart Failure Navigator Progress Note  Assessed for Heart & Vascular TOC clinic readiness.  Does not meet criteria due to current [facility] patient.   Navigator will sign off at this time.  Roxy Horseman, RN, BSN [facility] Heart Failure Navigator Secure Chat Only

## 2023-07-16 ENCOUNTER — Inpatient Hospital Stay

## 2023-07-16 DIAGNOSIS — F32A Depression, unspecified: Secondary | ICD-10-CM | POA: Diagnosis not present

## 2023-07-16 DIAGNOSIS — I10 Essential (primary) hypertension: Secondary | ICD-10-CM | POA: Diagnosis not present

## 2023-07-16 DIAGNOSIS — R531 Weakness: Secondary | ICD-10-CM | POA: Diagnosis not present

## 2023-07-16 DIAGNOSIS — I5033 Acute on chronic diastolic (congestive) heart failure: Secondary | ICD-10-CM | POA: Diagnosis not present

## 2023-07-16 LAB — BASIC METABOLIC PANEL WITH GFR
Anion gap: 8 (ref 5–15)
BUN: 14 mg/dL (ref 8–23)
CO2: 29 mmol/L (ref 22–32)
Calcium: 8.4 mg/dL — ABNORMAL LOW (ref 8.9–10.3)
Chloride: 98 mmol/L (ref 98–111)
Creatinine, Ser: 0.68 mg/dL (ref 0.61–1.24)
GFR, Estimated: >60 mL/min (ref >60)
Glucose, Bld: 94 mg/dL (ref 70–99)
Potassium: 3.4 mmol/L — ABNORMAL LOW (ref 3.5–5.1)
Sodium: 135 mmol/L (ref 135–145)

## 2023-07-16 LAB — CBC
HCT: 38.4 % — ABNORMAL LOW (ref 39.0–52.0)
Hemoglobin: 12.7 g/dL — ABNORMAL LOW (ref 13.0–17.0)
MCH: 28.8 pg (ref 26.0–34.0)
MCHC: 33.1 g/dL (ref 30.0–36.0)
MCV: 87.1 fL (ref 80.0–100.0)
Platelets: 254 10*3/uL (ref 150–400)
RBC: 4.41 MIL/uL (ref 4.22–5.81)
RDW: 12.2 % (ref 11.5–15.5)
WBC: 4.4 10*3/uL (ref 4.0–10.5)
nRBC: 0 % (ref 0.0–0.2)

## 2023-07-16 MED ORDER — POTASSIUM CHLORIDE CRYS ER 20 MEQ PO TBCR
20.0000 meq | EXTENDED_RELEASE_TABLET | Freq: Once | ORAL | Status: AC
  Administered 2023-07-16: 20 meq via ORAL
  Filled 2023-07-16: qty 1

## 2023-07-16 MED ORDER — DAPAGLIFLOZIN PROPANEDIOL 10 MG PO TABS
10.0000 mg | ORAL_TABLET | Freq: Every day | ORAL | Status: DC
  Administered 2023-07-16 – 2023-07-17 (×2): 10 mg via ORAL
  Filled 2023-07-16 (×2): qty 1

## 2023-07-16 MED ORDER — SACUBITRIL-VALSARTAN 24-26 MG PO TABS
1.0000 | ORAL_TABLET | Freq: Two times a day (BID) | ORAL | Status: DC
  Administered 2023-07-16: 1 via ORAL
  Filled 2023-07-16: qty 1

## 2023-07-16 MED ORDER — SACUBITRIL-VALSARTAN 24-26 MG PO TABS: 1.0000 | ORAL_TABLET | Freq: Two times a day (BID) | ORAL | Status: DC

## 2023-07-16 MED ORDER — CARVEDILOL 12.5 MG PO TABS
12.5000 mg | ORAL_TABLET | Freq: Two times a day (BID) | ORAL | Status: DC
  Administered 2023-07-16 – 2023-07-17 (×2): 12.5 mg via ORAL
  Filled 2023-07-16 (×2): qty 1

## 2023-07-16 MED ORDER — CARVEDILOL 6.25 MG PO TABS: 6.2500 mg | ORAL_TABLET | Freq: Two times a day (BID) | ORAL | Status: DC

## 2023-07-16 MED ORDER — SPIRONOLACTONE 25 MG PO TABS
25.0000 mg | ORAL_TABLET | Freq: Every day | ORAL | Status: DC
  Administered 2023-07-17: 25 mg via ORAL
  Filled 2023-07-16: qty 1

## 2023-07-16 MED ORDER — LOSARTAN POTASSIUM 50 MG PO TABS: 100.0000 mg | ORAL_TABLET | Freq: Every day | ORAL | Status: DC

## 2023-07-16 NOTE — Progress Notes (Signed)
 Heart Failure Stewardship Pharmacy Note  PCP: Lyle San, MD PCP-Cardiologist: None  HPI: Douglas Newton is a 63 y.o. male with osteoarthritis, GERD, hypertension, dyslipidemia and seizure disorder who presented with dyspnea, orthopnea, occasional PND, dizziness, and generalized weakness. On admission, BNP was 339, HS-troponin was 24, PCT <0.1 , d-dimer 0.39, and lactic acid of 1.5. Chest x-ray noted congestive changes.   Pertinent cardiac history: Echo in 03/2015 noted LVEF of 65-70% with moderate LVH. Echo in 03/2019 noted LVEF >75%, moderate asymmetric LVH of the septal segment, with grade I diastolic dysfunction. Echo this admission noted LVEF remains 70-75% with grade II diastolic dysfunction, moderate concentric hypertrophy, read to be consistent with hypertrophic cardiomyopathy.    Pertinent Lab Values: Creatinine  Date Value Ref Range Status  01/28/2012 0.83 0.60 - 1.30 mg/dL Final   Creatinine, Ser  Date Value Ref Range Status  07/16/2023 0.68 0.61 - 1.24 mg/dL Final   BUN  Date Value Ref Range Status  07/16/2023 14 8 - 23 mg/dL Final  60/45/4098 15 7 - 18 mg/dL Final   Potassium  Date Value Ref Range Status  07/16/2023 3.4 (L) 3.5 - 5.1 mmol/L Final  01/28/2012 4.3 3.5 - 5.1 mmol/L Final   Sodium  Date Value Ref Range Status  07/16/2023 135 135 - 145 mmol/L Final  01/28/2012 140 136 - 145 mmol/L Final   B Natriuretic Peptide  Date Value Ref Range Status  07/14/2023 339.0 (H) 0.0 - 100.0 pg/mL Final    Comment:    Performed at [facility], [address] [place], [place] [address]   Magnesium  Date Value Ref Range Status  04/13/2019 2.0 1.7 - 2.4 mg/dL Final    Comment:    Performed at [facility], [address] [place], [place] [address]   TSH  Date Value Ref Range Status  04/13/2019 1.706 0.350 - 4.500 uIU/mL Final    Comment:    Performed by a 3rd Generation assay with a functional sensitivity of <=0.01 uIU/mL. Performed  at [facility], [address] [place], [place] [address]     Vital Signs:  Temp:  [98.1 F (36.7 C)-98.5 F (36.9 C)] 98.2 F (36.8 C) (06/18 0824) Pulse Rate:  [54-71] 61 (06/18 0824) Cardiac Rhythm: Normal sinus rhythm (06/18 0700) Resp:  [15-22] 22 (06/18 0405) BP: (149-163)/(67-97) 158/75 (06/18 0824) SpO2:  [93 %-100 %] 99 % (06/18 0824) Weight:  [106.3 kg (234 lb 5.6 oz)] 106.3 kg (234 lb 5.6 oz) (06/17 1356)  Intake/Output Summary (Last 24 hours) at 07/16/2023 1103 Last data filed at 07/16/2023 0900 Gross per 24 hour  Intake --  Output 3260 ml  Net -3260 ml   Current Heart Failure Medications:  Loop diuretic: furosemide 40 mg IV BID Beta-Blocker: carvedilol 12.5 mg BID ACEI/ARB/ARNI: losartan  100 mg daily MRA: none SGLT2i: none Other: minoxidil 2.5 mg daily, hydralazine  50 mg TID  Prior to admission Heart Failure Medications:  Loop diuretic: none Beta-Blocker: carvedilol 12.5 mg BID ACEI/ARB/ARNI: losartan  100 mg daily MRA: none SGLT2i: none Other: minoxidil 2.5 mg daily, hydralazine  50 mg TID  Assessment: 1. Acute on chronic diastolic heart failure (LVEF in 2021 of >75%)  , due to unknown etiology. NYHA class III symptoms.  -Symptoms: Patient reports feeling fine laying with head elevated in bed. Reports wheezing and coughing, but no shortness of breath at rest. No further pre-syncopal episodes. -Volume: Appears to be volume up with mild LEE and orthopnea. Urine color is clear, pale yellow.  Currently reports good output on furosemide 40 mg IV BID. -Hemodynamics: BP is more stable today at higher end of normal. 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 Direct vasodilators like hydralazine  and minoxidil are significant contributors to this. Hydralazine  has been stopped and minoxidil is at low dose for alopecia. Will need to be careful with other afterload reducers as well. -BB: Currently on carvedilol 6.25 mg BID. If symptoms are though to be  secondary to LVOTO, BB titration would be warranted. Can consider pending further work-up. -ACEI/ARB/ARNI: Transitioned from losartan  to Entresto 24-26 mg BID.  -MRA: Spironolactone added today.   -SGLT2i: Farxiga added today -Will follow-up further work-up for obstruction (ie CMRI, exercise echo, genetic testing, etc). If LVOTO is contributing to symptoms, patient could be considered a BB failure given PTA use at a moderate dose, though generally metoprolol is used. May require alternative treatment.  Plan: 1) Medication changes recommended at this time: -None.  2) Patient assistance: -Viola Greulich copay is $26 -[place] and Jardiance copays are $37.50 -All copays will be $10 or less with copay cards  3) Education: - Patient has been educated on current HF medications and potential additions to HF medication regimen - Patient verbalizes understanding that over the next few months, these medication doses may change and more medications may be added to optimize HF regimen - Patient has been educated on basic disease state pathophysiology and goals of therapy  Medication Assistance / Insurance Benefits Check: Does the patient have prescription insurance?    Type of insurance plan:  Does the patient qualify for medication assistance through manufacturers or grants? No   Outpatient Pharmacy: Prior to admission outpatient pharmacy: [facility]      Please do not hesitate to reach out with questions or concerns,  Bevely Brush, PharmD, CPP, BCPS Heart Failure Pharmacist  Phone - [phone] 07/16/2023 11:03 AM

## 2023-07-16 NOTE — Progress Notes (Addendum)
 Mobility Specialist - Progress Note   07/16/23 1400  Mobility  Activity Ambulated independently in hallway  Level of Assistance Independent  Assistive Device None  Distance Ambulated (ft) 180 ft  Activity Response Tolerated well  Mobility visit 1 Mobility     Nurse requested Mobility Specialist to perform oxygen saturation test with pt which includes removing pt from oxygen both at rest and while ambulating.  Below are the results from that testing.      O2 while resting on RA = 100%  O2 while AMB on RA = 98%  O2 while AMB on ?L = N/A   Pt lying in bed upon arrival, utilizing RA. Pt completed bed mobility, STS, and ambulation independently. Denied dizziness and SOB throughout session. No complaints. Pt returned to bed with needs in reach. Reported results to nurse.   Searcy Czech Mobility Specialist 07/16/23, 2:51 PM

## 2023-07-16 NOTE — Progress Notes (Signed)
 [facility] CLINIC CARDIOLOGY PROGRESS NOTE       Patient ID: Douglas Newton MRN: [id number] DOB/AGE: 05/07/1960 63 y.o.  Admit date: 07/14/2023 Referring Physician Dr. Lydia Sams Primary Physician Lyle San, MD Primary Cardiologist Dr. Parks Bollman Reason for Consultation Acute CHF  HPI: Douglas Newton is a 63 y.o. male  with a past medical history of hx of syncope, chronic HFpEF , hypertension, hyperlipidemia, severe LVH who presented to the ED on 07/14/2023 for acute onset dyspnea associated with orthopnea.  Patient has no known history of heart failure.  Cardiology was consulted for further evaluation.   Interval History: -Patient seen and examined this AM and laying comfortably in hospital bed. Patient states he still feels SOB and has significant LEE. Denies chest pain.  -Patients BP elevated and HR stable this AM. Overnight Tele showed no significant events.  -Yesterday UOP 2.6L with stable renal function. -Patient remains on room air with stable SpO2.    Review of systems complete and found to be negative unless listed above    Past Medical History:  Diagnosis Date   Arthritis 2011   Chicken pox    Colon polyp 01/15/2012   Diarrhea    GERD (gastroesophageal reflux disease)    Hernia 2007   admits 2006   Hyperlipemia    Hypertension admits 2011   Obesity, unspecified    Personal history of tobacco use, presenting hazards to health    Screening for obesity    Seizure disorder (HCC) 1965   admits 1964   Syncope and collapse    Ulcer 1975   admits 1974   Umbilical hernia without mention of obstruction or gangrene    Ventral hernia, unspecified, without mention of obstruction or gangrene     Past Surgical History:  Procedure Laterality Date   ABDOMINAL SURGERY  1965   arm surgery  1969   COLONOSCOPY  01/15/2012   Dr Marquita Situ   COLONOSCOPY N/A 03/28/2023   Procedure: COLONOSCOPY;  Surgeon: Quintin Buckle, DO;  Location: [facility] ENDOSCOPY;  Service:  Gastroenterology;  Laterality: N/A;   COLONOSCOPY WITH PROPOFOL  N/A 12/04/2016   Procedure: COLONOSCOPY WITH PROPOFOL ;  Surgeon: Marshall Skeeter, MD;  Location: [facility] ENDOSCOPY;  Service: Endoscopy;  Laterality: N/A;   COLONOSCOPY WITH PROPOFOL  N/A 12/17/2019   Procedure: COLONOSCOPY WITH PROPOFOL ;  Surgeon: Marshall Skeeter, MD;  Location: [facility] ENDOSCOPY;  Service: Endoscopy;  Laterality: N/A;   HERNIA REPAIR  December 2013   ventral hernia,umbilical hernia   KNEE ARTHROSCOPY  October 1983   right knee   KNEE SURGERY  October 2011   left knee   LOBECTOMY  May 1965   POLYPECTOMY  03/28/2023   Procedure: POLYPECTOMY;  Surgeon: Quintin Buckle, DO;  Location: [facility] ENDOSCOPY;  Service: Gastroenterology;;   stomach tube  1965   tracheotomy  1965   wires removed from under arm  1963    Medications Prior to Admission  Medication Sig Dispense Refill Last Dose/Taking   albuterol (VENTOLIN HFA) 108 (90 Base) MCG/ACT inhaler Inhale 2 puffs into the lungs every 6 (six) hours as needed for shortness of breath or wheezing.   07/14/2023 Morning   amoxicillin (AMOXIL) 500 MG capsule Take 1,000 mg by mouth every 8 (eight) hours.   Taking   carbamazepine  (TEGRETOL ) 200 MG tablet Take 200 mg by mouth 4 (four) times daily.   07/14/2023 Morning   carvedilol (COREG) 12.5 MG tablet Take 12.5 mg by mouth 2 (two) times daily with a meal.  07/14/2023   celecoxib (CELEBREX) 200 MG capsule Take 200 mg by mouth daily.   Past Month   chlorpheniramine-HYDROcodone  (TUSSIONEX) 10-8 MG/5ML Take 5 mLs by mouth at bedtime as needed.   Past Month   doxycycline (VIBRAMYCIN) 100 MG capsule Take 100 mg by mouth.   Past Month   escitalopram (LEXAPRO) 10 MG tablet Take 10 mg by mouth daily.   Past Month   hydrALAZINE  (APRESOLINE ) 50 MG tablet Take 50 mg by mouth 3 (three) times daily.   07/14/2023 Morning   losartan  (COZAAR ) 100 MG tablet Take 100 mg by mouth daily.   07/13/2023 Morning   minoxidil (LONITEN) 2.5 MG tablet  Take by mouth daily.   07/14/2023 Morning   [EXPIRED] ondansetron  (ZOFRAN -ODT) 4 MG disintegrating tablet Take 8 mg by mouth.   Past Week   predniSONE (DELTASONE) 50 MG tablet Take 50 mg by mouth daily.   Past Month   venlafaxine (EFFEXOR) 37.5 MG tablet Take 37.5 mg by mouth 2 (two) times daily.   Past Week   fluticasone  (FLONASE ) 50 MCG/ACT nasal spray Place 2 sprays into both nostrils daily.      hydrochlorothiazide (HYDRODIURIL) 25 MG tablet Take 25 mg by mouth daily. (Patient not taking: Reported on 07/14/2023)   Not Taking   meclizine  (ANTIVERT ) 25 MG tablet Take 1 tablet (25 mg total) by mouth 3 (three) times daily as needed for dizziness. (Patient not taking: Reported on 07/14/2023) 30 tablet 0 Not Taking   sildenafil (VIAGRA) 100 MG tablet Take 100 mg by mouth daily as needed for erectile dysfunction. (Patient not taking: Reported on 07/14/2023)   Not Taking   Social History   Socioeconomic History   Marital status: Widowed    Spouse name: Not on file   Number of children: Not on file   Years of education: Not on file   Highest education level: Not on file  Occupational History   Not on file  Tobacco Use   Smoking status: Some Days    Current packs/day: 0.00    Types: Cigars, Cigarettes   Smokeless tobacco: Never  Vaping Use   Vaping status: Never Used  Substance and Sexual Activity   Alcohol use: Yes    Comment: rarely   Drug use: No   Sexual activity: Not on file  Other Topics Concern   Not on file  Social History Narrative   ** Merged History Encounter **       Social Drivers of Health   Financial Resource Strain: Low Risk  (05/29/2023)   Received from [facility]   Overall Financial Resource Strain (CARDIA)    Difficulty of Paying Living Expenses: Not hard at all  Food Insecurity: No Food Insecurity (07/15/2023)   Hunger Vital Sign    Worried About Running Out of Food in the Last Year: Never true    Ran Out of Food in the Last Year: Never true   Transportation Needs: No Transportation Needs (07/15/2023)   PRAPARE - Administrator, Civil Service (Medical): No    Lack of Transportation (Non-Medical): No  Physical Activity: Not on file  Stress: Not on file  Social Connections: Not on file  Intimate Partner Violence: Not At Risk (07/15/2023)   Humiliation, Afraid, Rape, and Kick questionnaire    Fear of Current or Ex-Partner: No    Emotionally Abused: No    Physically Abused: No    Sexually Abused: No    Family History  Problem Relation Age  of Onset   Heart attack Maternal Grandfather    Heart attack Maternal Grandmother    Heart attack Mother    Brain cancer Mother    Hypertension Paternal Grandfather    Hypertension Paternal Grandmother      Vitals:   07/15/23 2011 07/16/23 0006 07/16/23 0405 07/16/23 0824  BP: (!) 156/67 (!) 149/72 (!) 152/83 (!) 158/75  Pulse: (!) 58 68 71 61  Resp: 20 (!) 22 (!) 22   Temp: 98.1 F (36.7 C) 98.1 F (36.7 C) 98.5 F (36.9 C) 98.2 F (36.8 C)  TempSrc:      SpO2: 99% 99% 97% 99%  Weight:      Height:        PHYSICAL EXAM General: Well-appearing male, well nourished, in no acute distress. HEENT: Normocephalic and atraumatic. Neck: No JVD.   Lungs: Normal respiratory effort on room air. Diffuse loud rhonchi bilaterally. Heart: HRRR. Normal S1 and S2 without gallops or murmurs.  Abdomen: Non-distended appearing.  Msk: Normal strength and tone for age. Extremities: Warm and well perfused. No clubbing, cyanosis. Bilateral 2+ edema.  Neuro: Alert and oriented X 3. Psych: Answers questions appropriately.   Labs: Basic Metabolic Panel: Recent Labs    07/15/23 0516 07/16/23 0537  NA 135 135  K 3.9 3.4*  CL 103 98  CO2 28 29  GLUCOSE 116* 94  BUN 14 14  CREATININE 0.64 0.68  CALCIUM 8.4* 8.4*   Liver Function Tests: No results for input(s): AST, ALT, ALKPHOS, BILITOT, PROT, ALBUMIN in the last 72 hours. No results for input(s): LIPASE,  AMYLASE in the last 72 hours. CBC: Recent Labs    07/15/23 0516 07/16/23 0537  WBC 8.8 4.4  HGB 12.3* 12.7*  HCT 37.4* 38.4*  MCV 89.3 87.1  PLT 262 254   Cardiac Enzymes: Recent Labs    07/14/23 1250 07/14/23 1446  TROPONINIHS 26* 24*   BNP: Recent Labs    07/14/23 1250  BNP 339.0*   D-Dimer: Recent Labs    07/14/23 1250  DDIMER 0.39   Hemoglobin A1C: No results for input(s): HGBA1C in the last 72 hours. Fasting Lipid Panel: No results for input(s): CHOL, HDL, LDLCALC, TRIG, CHOLHDL, LDLDIRECT in the last 72 hours. Thyroid Function Tests: No results for input(s): TSH, T4TOTAL, T3FREE, THYROIDAB in the last 72 hours.  Invalid input(s): FREET3 Anemia Panel: No results for input(s): VITAMINB12, FOLATE, FERRITIN, TIBC, IRON, RETICCTPCT in the last 72 hours.   Radiology: ECHOCARDIOGRAM COMPLETE Result Date: 07/15/2023    ECHOCARDIOGRAM REPORT   Patient Name:   Douglas Newton Date of Exam: 07/15/2023 Medical Rec #:  [id number]          Height:       66.0 in Accession #:    [id number]         Weight:       233.0 lb Date of Birth:  1960-03-16          BSA:          2.134 m Patient Age:    63 years           BP:           155/86 mmHg Patient Gender: M                  HR:           65 bpm. Exam Location:  [facility] Procedure: 2D Echo, Cardiac Doppler and Color Doppler (Both Spectral and Color  Flow Doppler were utilized during procedure). Indications:     CHF  History:         Patient has prior history of Echocardiogram examinations, most                  recent 04/13/2019. CHF; Risk Factors:Hypertension.  Sonographer:     Gaston Karvonen, FE, PE Referring Phys:  [id number] Creighton Doffing Diagnosing Phys: Antonette Batters MD IMPRESSIONS  1. Hyperthrophic cardiomyopathy.  2. Left ventricular ejection fraction, by estimation, is 70 to 75%. The left ventricle has hyperdynamic function. The left ventricle has no regional wall motion  abnormalities. There is moderate concentric left ventricular hypertrophy. Left ventricular diastolic parameters are consistent with Grade II diastolic dysfunction (pseudonormalization).  3. Right ventricular systolic function is normal. The right ventricular size is normal.  4. Left atrial size was mildly dilated.  5. The mitral valve is normal in structure. Mild mitral valve regurgitation.  6. The aortic valve is normal in structure. Aortic valve regurgitation is not visualized. Conclusion(s)/Recommendation(s): Findings consistent with hypertrophic cardiomyopathy. FINDINGS  Left Ventricle: Left ventricular ejection fraction, by estimation, is 70 to 75%. The left ventricle has hyperdynamic function. The left ventricle has no regional wall motion abnormalities. Global longitudinal strain performed but not reported based on interpreter judgement due to suboptimal tracking. The left ventricular internal cavity size was normal in size. There is moderate concentric left ventricular hypertrophy. Left ventricular diastolic parameters are consistent with Grade II diastolic dysfunction (pseudonormalization). Right Ventricle: The right ventricular size is normal. No increase in right ventricular wall thickness. Right ventricular systolic function is normal. Left Atrium: Left atrial size was mildly dilated. Right Atrium: Right atrial size was normal in size. Pericardium: There is no evidence of pericardial effusion. Mitral Valve: The mitral valve is normal in structure. Mild mitral valve regurgitation. Tricuspid Valve: The tricuspid valve is normal in structure. Tricuspid valve regurgitation is mild. Aortic Valve: The aortic valve is normal in structure. Aortic valve regurgitation is not visualized. Pulmonic Valve: The pulmonic valve was normal in structure. Pulmonic valve regurgitation is not visualized. Aorta: The ascending aorta was not well visualized. IAS/Shunts: No atrial level shunt detected by color flow Doppler.  Additional Comments: Hyperthrophic cardiomyopathy. 3D was performed not requiring image post processing on an independent workstation and was indeterminate.  LEFT VENTRICLE PLAX 2D LVIDd:         4.95 cm   Diastology LVIDs:         3.01 cm   LV e' medial:    3.51 cm/s LV PW:         1.46 cm   LV E/e' medial:  36.2 LV IVS:        1.60 cm   LV e' lateral:   4.74 cm/s LVOT diam:     2.50 cm   LV E/e' lateral: 26.8 LV SV:         143 LV SV Index:   67 LVOT Area:     4.91 cm  RIGHT VENTRICLE RV S prime:     17.00 cm/s TAPSE (M-mode): 2.8 cm LEFT ATRIUM              Index        RIGHT ATRIUM           Index LA diam:        4.30 cm  2.02 cm/m   RA Area:     18.10 cm LA Vol (A2C):   156.0 ml 73.11 ml/m  RA Volume:   45.20 ml  21.18 ml/m LA Vol (A4C):   101.0 ml 47.33 ml/m LA Biplane Vol: 133.0 ml 62.33 ml/m  AORTIC VALVE LVOT Vmax:   142.00 cm/s LVOT Vmean:  95.300 cm/s LVOT VTI:    0.292 m  AORTA Ao Root diam: 3.20 cm MITRAL VALVE                TRICUSPID VALVE MV Area (PHT): 2.74 cm     TR Peak grad:   6.5 mmHg MV Decel Time: 277 msec     TR Vmax:        127.00 cm/s MV E velocity: 127.00 cm/s MV A velocity: 119.00 cm/s  SHUNTS MV E/A ratio:  1.07         Systemic VTI:  0.29 m                             Systemic Diam: 2.50 cm Antonette Batters MD Electronically signed by Antonette Batters MD Signature Date/Time: 07/15/2023/11:19:06 PM    Final    CT CHEST WO CONTRAST Result Date: 07/14/2023 CLINICAL DATA:  Syncope. EXAM: CT CHEST WITHOUT CONTRAST TECHNIQUE: Multidetector CT imaging of the chest was performed following the standard protocol without IV contrast. RADIATION DOSE REDUCTION: This exam was performed according to the departmental dose-optimization program which includes automated exposure control, adjustment of the mA and/or kV according to patient size and/or use of iterative reconstruction technique. COMPARISON:  None Available. FINDINGS: Cardiovascular: Atherosclerotic calcification of the aorta with  age advanced involvement of the left anterior descending coronary artery. Enlarged pulmonic trunk and heart. No pericardial effusion. Mediastinum/Nodes: No pathologically enlarged mediastinal or axillary lymph nodes. Hilar regions are difficult to definitively evaluate without IV contrast. Esophagus is grossly unremarkable. Lungs/Pleura: Calcified granulomas. Minimal scattered pulmonary parenchymal scarring. No pleural fluid. Airway is unremarkable. Upper Abdomen: Visualized portions of the liver, gallbladder, adrenal glands, kidneys, spleen, pancreas, stomach and bowel are grossly unremarkable. No upper abdominal adenopathy. Musculoskeletal: Degenerative changes in the spine. IMPRESSION: 1. No acute findings. 2. Age advanced left anterior descending coronary artery calcification. 3.  Aortic atherosclerosis (ICD10-I70.0). Electronically Signed   By: Shearon Denis M.D.   On: 07/14/2023 17:18   DG Chest Port 1 View Result Date: 07/14/2023 CLINICAL DATA:  Syncope EXAM: PORTABLE CHEST 1 VIEW COMPARISON:  Jun 06, 2023 FINDINGS: Mild bilateral reticular interstitial infiltrates could correlate with congestive changes with mild cardiomegaly. No pleural effusions. IMPRESSION: Mild bilateral reticular interstitial infiltrates could correlate with congestive changes with mild cardiomegaly. Electronically Signed   By: Fredrich Jefferson M.D.   On: 07/14/2023 13:41    ECHO as above  TELEMETRY reviewed by me 07/16/2023: Sinus rhythm rate 67s  EKG reviewed by me: sinus rhythm rate 62 bpm without acute ischemic changes.   Data reviewed by me 07/16/2023: last 24h vitals tele labs imaging I/O hospiatlist progress note.  Principal Problem:   Generalized weakness Active Problems:   Essential hypertension   Depression   Acute on chronic diastolic CHF (congestive heart failure) (HCC)    ASSESSMENT AND PLAN:  Douglas Newton is a 63 y.o. male  with a past medical history of hx of syncope, chronic HFpEF, hypertension,  hyperlipidemia, severe LVH who presented to the ED on 07/14/2023 for acute onset dyspnea associated with orthopnea.  Patient has no known history of heart failure.  Cardiology was consulted for further evaluation.   # Acute on chronic HFpEF  exacerbation # Demand ischemia # Hypertension # Hyperlipidemia Patient presents with worsening shortness of breath and orthopnea. Patient reports having a syncopal episode when his BP dropped. CXR with pulmonary vascular congestion with mild cardiomegaly.  BNP elevated at 330.  CT revealed age advanced LAD calcification, aortic atherosclerosis. Troponin was minimally elevated and flat 26 >24. EKG with sinus rhythm rate 62 bpm without acute ischemic changes. Echo this admission with pEF, hyperdynamic LV function, no RWMA, grade II diastolic dysfunction, moderate concentric LVH. -CXR ordered. -Monitor and replenish electrolytes for a goal K >4, Mag >2  -Continue IV lasix 40 mg BID.  Closely monitor renal function, electrolytes, UOP. -Continue Coreg 12.5 mg twice daily. -Transition losartan  to Entrestro 24-26 mg BID.Uptitrate as BP allows. Viola Greulich copay [phone]) -Increased spironolactone to 25 mg daily. -Ordered dapagliflozin 10 mg daily. (Copays $37.50)         (All copays will be $10 or less with co-pay cards -per pharmacy) -Elevated and flat trops in the setting of acute heart failure exacerbation is most consistent with demand/supply mismatch and not ACS. -Consider monitor placement at discharge due to syncopal event prior to admission.  -Pulmonology consulted, appreciate recommendations.  -Plan for outpatient cardiac MRI to further evaluate possible hypertrophic cardiomyopathy.    This patient's plan of care was discussed and created with Dr. Beau Bound and he is in agreement.  Signed: Creighton Doffing, PA-C  07/16/2023, 12:07 PM [facility] Cardiology
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
## 2023-07-16 NOTE — Plan of Care (Signed)

## 2023-07-17 ENCOUNTER — Other Ambulatory Visit: Payer: Self-pay

## 2023-07-17 DIAGNOSIS — R531 Weakness: Secondary | ICD-10-CM | POA: Diagnosis not present

## 2023-07-17 LAB — BASIC METABOLIC PANEL WITH GFR
Anion gap: 12 (ref 5–15)
BUN: 16 mg/dL (ref 8–23)
CO2: 26 mmol/L (ref 22–32)
Calcium: 8.8 mg/dL — ABNORMAL LOW (ref 8.9–10.3)
Chloride: 95 mmol/L — ABNORMAL LOW (ref 98–111)
Creatinine, Ser: 0.73 mg/dL (ref 0.61–1.24)
GFR, Estimated: >60 mL/min (ref >60)
Glucose, Bld: 92 mg/dL (ref 70–99)
Potassium: 3.5 mmol/L (ref 3.5–5.1)
Sodium: 133 mmol/L — ABNORMAL LOW (ref 135–145)

## 2023-07-17 MED ORDER — PREDNISONE 50 MG PO TABS: 50.0000 mg | ORAL_TABLET | Freq: Every day | ORAL | Status: DC

## 2023-07-17 MED ORDER — SPIRONOLACTONE 25 MG PO TABS: 25.0000 mg | ORAL_TABLET | Freq: Every day | ORAL | 1 refills | Status: AC

## 2023-07-17 MED ORDER — PREDNISONE 20 MG PO TABS: 40.0000 mg | ORAL_TABLET | Freq: Every day | ORAL | Status: DC

## 2023-07-17 MED ORDER — DAPAGLIFLOZIN PROPANEDIOL 10 MG PO TABS: 10.0000 mg | ORAL_TABLET | Freq: Every day | ORAL | 1 refills | Status: AC

## 2023-07-17 MED ORDER — PREDNISONE 50 MG PO TABS: 25.0000 mg | ORAL_TABLET | Freq: Every day | ORAL | Status: DC

## 2023-07-17 MED ORDER — FLUTICASONE FUROATE-VILANTEROL 200-25 MCG/ACT IN AEPB: 1.0000 | INHALATION_SPRAY | Freq: Every day | RESPIRATORY_TRACT | 0 refills | Status: AC

## 2023-07-17 MED ORDER — SACUBITRIL-VALSARTAN 49-51 MG PO TABS: 1.0000 | ORAL_TABLET | Freq: Two times a day (BID) | ORAL | 1 refills | Status: DC

## 2023-07-17 MED ORDER — FLUTICASONE FUROATE-VILANTEROL 200-25 MCG/ACT IN AEPB
1.0000 | INHALATION_SPRAY | Freq: Every day | RESPIRATORY_TRACT | Status: DC
  Administered 2023-07-17: 1 via RESPIRATORY_TRACT
  Filled 2023-07-17: qty 28

## 2023-07-17 MED ORDER — PREDNISONE 20 MG PO TABS: 30.0000 mg | ORAL_TABLET | Freq: Every day | ORAL | Status: DC

## 2023-07-17 MED ORDER — PREDNISONE 20 MG PO TABS: 20.0000 mg | ORAL_TABLET | Freq: Every day | ORAL | Status: DC

## 2023-07-17 MED ORDER — PREDNISONE 50 MG PO TABS: 35.0000 mg | ORAL_TABLET | Freq: Every day | ORAL | Status: DC

## 2023-07-17 MED ORDER — PREDNISONE 10 MG PO TABS: 50.0000 mg | ORAL_TABLET | Freq: Every day | ORAL | 0 refills | Status: DC

## 2023-07-17 MED ORDER — PREDNISONE 50 MG PO TABS: 45.0000 mg | ORAL_TABLET | Freq: Every day | ORAL | Status: DC

## 2023-07-17 MED ORDER — PREDNISONE 10 MG PO TABS: 10.0000 mg | ORAL_TABLET | Freq: Every day | ORAL | Status: DC

## 2023-07-17 MED ORDER — FUROSEMIDE 40 MG PO TABS: 40.0000 mg | ORAL_TABLET | Freq: Every day | ORAL | Status: DC

## 2023-07-17 MED ORDER — FUROSEMIDE 40 MG PO TABS: 40.0000 mg | ORAL_TABLET | Freq: Every day | ORAL | 1 refills | Status: AC

## 2023-07-17 MED ORDER — PREDNISONE 10 MG PO TABS: 15.0000 mg | ORAL_TABLET | Freq: Every day | ORAL | Status: DC

## 2023-07-17 MED ORDER — POTASSIUM CHLORIDE CRYS ER 20 MEQ PO TBCR
40.0000 meq | EXTENDED_RELEASE_TABLET | Freq: Once | ORAL | Status: AC
  Administered 2023-07-17: 40 meq via ORAL
  Filled 2023-07-17: qty 2

## 2023-07-17 MED ORDER — PREDNISONE 10 MG PO TABS: 5.0000 mg | ORAL_TABLET | Freq: Every day | ORAL | Status: DC

## 2023-07-17 MED ORDER — SACUBITRIL-VALSARTAN 49-51 MG PO TABS
1.0000 | ORAL_TABLET | Freq: Two times a day (BID) | ORAL | Status: DC
  Administered 2023-07-17: 1 via ORAL
  Filled 2023-07-17: qty 1

## 2023-07-17 MED ORDER — SACUBITRIL-VALSARTAN 49-51 MG PO TABS
1.0000 | ORAL_TABLET | Freq: Two times a day (BID) | ORAL | 1 refills | Status: DC
  Filled 2023-07-17: qty 60, 30d supply, fill #0

## 2023-07-17 NOTE — TOC CM/SW Note (Signed)
 Transition of Care [facility]) - Inpatient Brief Assessment   Patient Details  Name: Douglas Newton MRN: [id number] Date of Birth: [birth year]  Transition of Care [facility] CM/SW Contact:    Odilia Bennett, LCSW Phone Number: 07/17/2023, 9:48 AM   Clinical Narrative: CSW reviewed chart. No TOC needs identified so far. CSW will continue to follow progress. Please place [facility] consult if any needs arise.  Transition of Care Asessment: Insurance and Status: Insurance coverage has been reviewed Patient has primary care physician: Yes Home environment has been reviewed: Single family home Prior level of function:: Not documented Prior/Current Home Services: No current home services Social Drivers of Health Review: SDOH reviewed no interventions necessary Readmission risk has been reviewed: Yes Transition of care needs: no transition of care needs at this time

## 2023-07-17 NOTE — Plan of Care (Signed)

## 2023-07-17 NOTE — Progress Notes (Signed)
 Heart Failure Stewardship Pharmacy Note  PCP: Lyle San, MD PCP-Cardiologist: None  HPI: Douglas Newton is a 63 y.o. male with osteoarthritis, GERD, hypertension, dyslipidemia and seizure disorder who presented with dyspnea, orthopnea, occasional PND, dizziness, and generalized weakness. On admission, BNP was 339, HS-troponin was 24, PCT <0.1 , d-dimer 0.39, and lactic acid of 1.5. Chest x-ray noted congestive changes.   Pertinent cardiac history: Echo in 03/2015 noted LVEF of 65-70% with moderate LVH. Echo in 03/2019 noted LVEF >75%, moderate asymmetric LVH of the septal segment, with grade I diastolic dysfunction. Echo this admission noted LVEF remains 70-75% with grade II diastolic dysfunction, moderate concentric hypertrophy, read to be consistent with hypertrophic cardiomyopathy.    Pertinent Lab Values: Creatinine  Date Value Ref Range Status  01/28/2012 0.83 0.60 - 1.30 mg/dL Final   Creatinine, Ser  Date Value Ref Range Status  07/17/2023 0.73 0.61 - 1.24 mg/dL Final   BUN  Date Value Ref Range Status  07/17/2023 16 8 - 23 mg/dL Final  09/60/4540 15 7 - 18 mg/dL Final   Potassium  Date Value Ref Range Status  07/17/2023 3.5 3.5 - 5.1 mmol/L Final  01/28/2012 4.3 3.5 - 5.1 mmol/L Final   Sodium  Date Value Ref Range Status  07/17/2023 133 (L) 135 - 145 mmol/L Final  01/28/2012 140 136 - 145 mmol/L Final   B Natriuretic Peptide  Date Value Ref Range Status  07/14/2023 339.0 (H) 0.0 - 100.0 pg/mL Final    Comment:    Performed at [facility], [address] [place], [place] [address]   Magnesium  Date Value Ref Range Status  04/13/2019 2.0 1.7 - 2.4 mg/dL Final    Comment:    Performed at [facility], [address] [place], [place] [address]   TSH  Date Value Ref Range Status  04/13/2019 1.706 0.350 - 4.500 uIU/mL Final    Comment:    Performed by a 3rd Generation assay with a functional sensitivity of <=0.01 uIU/mL. Performed  at [facility], [address] [place], [place] [address]    Vital Signs:  Temp:  [97.9 F (36.6 C)-98.7 F (37.1 C)] 98.5 F (36.9 C) (06/19 0901) Pulse Rate:  [60-67] 67 (06/19 0903) Cardiac Rhythm: Normal sinus rhythm (06/19 0817) Resp:  [18-20] 18 (06/19 0440) BP: (137-170)/(77-84) 145/84 (06/19 0903) SpO2:  [92 %-98 %] 94 % (06/19 0903)  Intake/Output Summary (Last 24 hours) at 07/17/2023 1046 Last data filed at 07/17/2023 3086 Gross per 24 hour  Intake 560 ml  Output 2475 ml  Net -1915 ml   Current Heart Failure Medications:  Loop diuretic: furosemide 40 mg IV BID Beta-Blocker: carvedilol 12.5 mg BID ACEI/ARB/ARNI: losartan  100 mg daily MRA: none SGLT2i: none Other: minoxidil 2.5 mg daily, hydralazine  50 mg TID  Prior to admission Heart Failure Medications:  Loop diuretic: none Beta-Blocker: carvedilol 12.5 mg BID ACEI/ARB/ARNI: losartan  100 mg daily MRA: none SGLT2i: none Other: minoxidil 2.5 mg daily, hydralazine  50 mg TID  Assessment: 1. Acute on chronic diastolic heart failure (LVEF in 2021 of >75%)  , due to unknown etiology. NYHA class III symptoms.  -Symptoms: Patient reports feeling fine today. Respiratory symptoms have improved. No further pre-syncopal episodes or dizziness/lightheadedness. Mild LEE, though much improved. -Volume: Close to euvolemic. Creatinine and BUN are slightly up, chloride down, CO2 stable. Currently on furosemide 40 mg IV BID. Would continue today and can consider oral diuretics tomorrow. -Hemodynamics: BP remains elevated. 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 Direct vasodilators like hydralazine  and minoxidil are significant contributors to this. Hydralazine  has been stopped and minoxidil is at low dose for alopecia. Will need to be careful with other afterload reducers as well while managing severe HTN.  -BB: Currently on carvedilol 12.5 mg BID. If symptoms are though to be secondary to LVOTO, BB titration would be  warranted. Can consider pending further work-up. -ACEI/ARB/ARNI: Entresto increased to 49-51 mg BID.  -MRA: Continue spironolactone  25 mg daily   -SGLT2i: Continue Farxiga 10 mg daily -Will follow-up further work-up for obstruction (ie CMRI, exercise echo, genetic testing, etc). Hypertrophy more likely due to persistent hypertension, but it is possible that HCM with LVOTO could be contributing to symptoms.  Plan: 1) Medication changes recommended at this time: -None.  2) Patient assistance: -Viola Greulich copay is $53 -[place] and [place] copays are $37.50 -All copays will be $10 or less with copay cards  3) Education: - Patient has been educated on current HF medications and potential additions to HF medication regimen - Patient verbalizes understanding that over the next few months, these medication doses may change and more medications may be added to optimize HF regimen - Patient has been educated on basic disease state pathophysiology and goals of therapy  Medication Assistance / Insurance Benefits Check: Does the patient have prescription insurance?    Type of insurance plan:  Does the patient qualify for medication assistance through manufacturers or grants? No   Outpatient Pharmacy: Prior to admission outpatient [facility]      Please do not hesitate to reach out with questions or concerns,  Bevely Brush, PharmD, CPP, BCPS Heart Failure Pharmacist  Phone - [phone] 07/17/2023 10:46 AM
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
## 2023-11-11 ENCOUNTER — Other Ambulatory Visit: Payer: Self-pay | Admitting: Orthopedic Surgery

## 2023-11-27 ENCOUNTER — Encounter
Admission: RE | Admit: 2023-11-27 | Discharge: 2023-11-27 | Disposition: A | Discharge status: Home / Self Care | Source: Ambulatory Visit | Attending: Orthopedic Surgery | Admitting: Orthopedic Surgery

## 2023-11-27 DIAGNOSIS — Z01812 Encounter for preprocedural laboratory examination: Secondary | ICD-10-CM | POA: Insufficient documentation

## 2023-11-27 HISTORY — DX: Atherosclerosis of aorta: I70.0

## 2023-11-27 HISTORY — DX: Atherosclerotic heart disease of native coronary artery without angina pectoris: I25.10

## 2023-11-27 HISTORY — DX: Heart failure, unspecified: I50.9

## 2023-11-27 LAB — URINALYSIS, ROUTINE W REFLEX MICROSCOPIC
Bacteria, UA: NONE SEEN
Bilirubin Urine: NEGATIVE
Glucose, UA: >=500 mg/dL — AB
Hgb urine dipstick: NEGATIVE
Ketones, ur: NEGATIVE mg/dL
Leukocytes,Ua: NEGATIVE
Nitrite: NEGATIVE
Protein, ur: NEGATIVE mg/dL
Specific Gravity, Urine: 1.03 (ref 1.005–1.030)
Squamous Epithelial / HPF: 0 /HPF (ref 0–5)
pH: 5 (ref 5.0–8.0)

## 2023-11-27 LAB — COMPREHENSIVE METABOLIC PANEL WITH GFR
ALT: 19 U/L (ref 0–44)
AST: 29 U/L (ref 15–41)
Albumin: 3.6 g/dL (ref 3.5–5.0)
Alkaline Phosphatase: 114 U/L (ref 38–126)
Anion gap: 13 (ref 5–15)
BUN: 21 mg/dL (ref 8–23)
CO2: 27 mmol/L (ref 22–32)
Calcium: 8.6 mg/dL — ABNORMAL LOW (ref 8.9–10.3)
Chloride: 100 mmol/L (ref 98–111)
Creatinine, Ser: 0.88 mg/dL (ref 0.61–1.24)
GFR, Estimated: >60 mL/min (ref >60)
Glucose, Bld: 130 mg/dL — ABNORMAL HIGH (ref 70–99)
Potassium: 3.2 mmol/L — ABNORMAL LOW (ref 3.5–5.1)
Sodium: 140 mmol/L (ref 135–145)
Total Bilirubin: 0.6 mg/dL (ref 0.0–1.2)
Total Protein: 6.9 g/dL (ref 6.5–8.1)

## 2023-11-27 LAB — CBC WITH DIFFERENTIAL/PLATELET
Abs Immature Granulocytes: 0.02 K/uL (ref 0.00–0.07)
Basophils Absolute: 0 K/uL (ref 0.0–0.1)
Basophils Relative: 0 %
Eosinophils Absolute: 0 K/uL (ref 0.0–0.5)
Eosinophils Relative: 0 %
HCT: 43.9 % (ref 39.0–52.0)
Hemoglobin: 14.5 g/dL (ref 13.0–17.0)
Immature Granulocytes: 0 %
Lymphocytes Relative: 18 %
Lymphs Abs: 1 K/uL (ref 0.7–4.0)
MCH: 29.7 pg (ref 26.0–34.0)
MCHC: 33 g/dL (ref 30.0–36.0)
MCV: 89.8 fL (ref 80.0–100.0)
Monocytes Absolute: 0.5 K/uL (ref 0.1–1.0)
Monocytes Relative: 10 %
Neutro Abs: 3.9 K/uL (ref 1.7–7.7)
Neutrophils Relative %: 72 %
Platelets: 258 K/uL (ref 150–400)
RBC: 4.89 MIL/uL (ref 4.22–5.81)
RDW: 12.4 % (ref 11.5–15.5)
WBC: 5.4 K/uL (ref 4.0–10.5)
nRBC: 0 % (ref 0.0–0.2)

## 2023-11-27 LAB — SURGICAL PCR SCREEN
MRSA, PCR: NEGATIVE
Staphylococcus aureus: NEGATIVE

## 2023-11-27 NOTE — Patient Instructions (Signed)
 Your procedure is scheduled on: 12/08/23 - Monday Report to the Registration Desk on the 1st floor of the [facility]. To find out your arrival time, please call [phone] between 1PM - 3PM on: 12/05/23 - Friday If your arrival time is 6:00 am, do not arrive before that time as the [facility] entrance doors do not open until 6:00 am.  REMEMBER: Instructions that are not followed completely may result in serious medical risk, up to and including death; or upon the discretion of your surgeon and anesthesiologist your surgery may need to be rescheduled.  Do not eat food after midnight the night before surgery.  No gum chewing or hard candies.  You may however, drink CLEAR liquids up to 2 hours before you are scheduled to arrive for your surgery. Do not drink anything within 2 hours of your scheduled arrival time.  Clear liquids include: - water  - apple juice without pulp - gatorade (not RED colors) - black coffee or tea (Do NOT add milk or creamers to the coffee or tea) Do NOT drink anything that is not on this list.  In addition, your doctor has ordered for you to drink the provided:  Ensure Pre-Surgery Clear Carbohydrate Drink  Drinking this carbohydrate drink up to two hours before surgery helps to reduce insulin resistance and improve patient outcomes. Please complete drinking 2 hours before scheduled arrival time.  One week prior to surgery: Stop taking  beginning 11/03, Anti-inflammatories (NSAIDS) such as diclofenac Sodium (VOLTAREN) , Advil, Aleve, Ibuprofen, Motrin, Naproxen, Naprosyn and Aspirin  based products such as Excedrin, Goody's Powder, BC Powder.   Stop ANY OVER THE COUNTER supplements until after surgery.  You may take Tylenol  if needed for pain up until the day of surgery.  FARXIGA  - hold beginning  11/07, may resume after surgery.  HOLD sacubitril -valsartan  (ENTRESTO ) and spironolactone  (ALDACTONE ) on the day of surgery.  ON THE DAY OF SURGERY ONLY TAKE  THESE MEDICATIONS WITH SIPS OF WATER:  carbamazepine  (TEGRETOL )  carvedilol  (COREG )  fluticasone  furoate-vilanterol (BREO ELLIPTA )   Use inhaler albuterol  (VENTOLIN  HFA)  on the day of surgery and bring to the hospital.  No Alcohol for 24 hours before or after surgery.  No Smoking including e-cigarettes for 24 hours before surgery.  No chewable tobacco products for at least 6 hours before surgery.  No nicotine patches on the day of surgery.  Do not use any recreational drugs for at least a week (preferably 2 weeks) before your surgery.  Please be advised that the combination of cocaine and anesthesia may have negative outcomes, up to and including death. If you test positive for cocaine, your surgery will be cancelled.  On the morning of surgery brush your teeth with toothpaste and water, you may rinse your mouth with mouthwash if you wish. Do not swallow any toothpaste or mouthwash.  Use CHG Soap or wipes as directed on instruction sheet.  Do not wear jewelry, make-up, hairpins, clips or nail polish.  For welded (permanent) jewelry: bracelets, anklets, waist bands, etc.  Please have this removed prior to surgery.  If it is not removed, there is a chance that hospital personnel will need to cut it off on the day of surgery.  Do not wear lotions, powders, or perfumes.   Do not shave body hair from the neck down 48 hours before surgery.  Contact lenses, hearing aids and dentures may not be worn into surgery.  Do not bring valuables to the hospital. [facility] is  not responsible for any missing/lost belongings or valuables.   Notify your doctor if there is any change in your medical condition (cold, fever, infection).  Wear comfortable clothing (specific to your surgery type) to the hospital.  After surgery, you can help prevent lung complications by doing breathing exercises.  Take deep breaths and cough every 1-2 hours. Your doctor may order a device called an Incentive  Spirometer to help you take deep breaths.  If you are being admitted to the hospital overnight, leave your suitcase in the car. After surgery it may be brought to your room.  In case of increased patient census, it may be necessary for you, the patient, to continue your postoperative care in the Same Day Surgery department.  If you are being discharged the day of surgery, you will not be allowed to drive home. You will need a responsible individual to drive you home and stay with you for 24 hours after surgery.   If you are taking public transportation, you will need to have a responsible individual with you.  Please call the Pre-admissions Testing Dept. at [phone] if you have any questions about these instructions.  Surgery Visitation Policy:  Patients having surgery or a procedure may have two visitors.  Children under the age of 32 must have an adult with them who is not the patient.  Inpatient Visitation:    Visiting hours are 7 a.m. to 8 p.m. Up to four visitors are allowed at one time in a patient room. The visitors may rotate out with other people during the day.  One visitor age 37 or older may stay with the patient overnight and must be in the room by 8 p.m.   Merchandiser, Retail to address health-related social needs:  [email or web address]    Pre-operative 4 CHG Bath Instructions   You can play a key role in reducing the risk of infection after surgery. Your skin needs to be as free of germs as possible. You can reduce the number of germs on your skin by washing with CHG (chlorhexidine gluconate) soap before surgery. CHG is an antiseptic soap that kills germs and continues to kill germs even after washing.   DO NOT use if you have an allergy to chlorhexidine/CHG or antibacterial soaps. If your skin becomes reddened or irritated, stop using the CHG and notify one of our RNs at [phone].   Please shower with the CHG soap starting 4 days  before surgery using the following schedule: 11/06 - 11/09.    Please keep in mind the following:  DO NOT shave, including legs and underarms, starting the day of your first shower.   You may shave your face at any point before/day of surgery.  Place clean sheets on your bed the day you start using CHG soap. Use a clean washcloth (not used since being washed) for each shower. DO NOT sleep with pets once you start using the CHG.   CHG Shower Instructions:  If you choose to wash your hair and private area, wash first with your normal shampoo/soap.  After you use shampoo/soap, rinse your hair and body thoroughly to remove shampoo/soap residue.  Turn the water OFF and apply about 3 tablespoons (45 ml) of CHG soap to a CLEAN washcloth.  Apply CHG soap ONLY FROM YOUR NECK DOWN TO YOUR TOES (washing for 3-5 minutes)  DO NOT use CHG soap on face, private areas, open wounds, or sores.  Pay special attention to the area where your surgery  is being performed.  If you are having back surgery, having someone wash your back for you may be helpful. Wait 2 minutes after CHG soap is applied, then you may rinse off the CHG soap.  Pat dry with a clean towel  Put on clean clothes/pajamas   If you choose to wear lotion, please use ONLY the CHG-compatible lotions on the back of this paper.     Additional instructions for the day of surgery: DO NOT APPLY any lotions, deodorants, cologne, or perfumes.   Put on clean/comfortable clothes.  Brush your teeth.  Ask your nurse before applying any prescription medications to the skin.      CHG Compatible Lotions   Aveeno Moisturizing lotion  Cetaphil Moisturizing Cream  Cetaphil Moisturizing Lotion  Clairol Herbal Essence Moisturizing Lotion, Dry Skin  Clairol Herbal Essence Moisturizing Lotion, Extra Dry Skin  Clairol Herbal Essence Moisturizing Lotion, Normal Skin  Curel Age Defying Therapeutic Moisturizing Lotion with Alpha Hydroxy  Curel Extreme Care  Body Lotion  Curel Soothing Hands Moisturizing Hand Lotion  Curel Therapeutic Moisturizing Cream, Fragrance-Free  Curel Therapeutic Moisturizing Lotion, Fragrance-Free  Curel Therapeutic Moisturizing Lotion, Original Formula  Eucerin Daily Replenishing Lotion  Eucerin Dry Skin Therapy Plus Alpha Hydroxy Crme  Eucerin Dry Skin Therapy Plus Alpha Hydroxy Lotion  Eucerin Original Crme  Eucerin Original Lotion  Eucerin Plus Crme Eucerin Plus Lotion  Eucerin TriLipid Replenishing Lotion  Keri Anti-Bacterial Hand Lotion  Keri Deep Conditioning Original Lotion Dry Skin Formula Softly Scented  Keri Deep Conditioning Original Lotion, Fragrance Free Sensitive Skin Formula  Keri Lotion Fast Absorbing Fragrance Free Sensitive Skin Formula  Keri Lotion Fast Absorbing Softly Scented Dry Skin Formula  Keri Original Lotion  Keri Skin Renewal Lotion Keri Silky Smooth Lotion  Keri Silky Smooth Sensitive Skin Lotion  Nivea Body Creamy Conditioning Oil  Nivea Body Extra Enriched Lotion  Nivea Body Original Lotion  Nivea Body Sheer Moisturizing Lotion Nivea Crme  Nivea Skin Firming Lotion  NutraDerm 30 Skin Lotion  NutraDerm Skin Lotion  NutraDerm Therapeutic Skin Cream  NutraDerm Therapeutic Skin Lotion  ProShield Protective Hand Cream  Provon moisturizing lotion  How to Use an Incentive Spirometer  An incentive spirometer is a tool that measures how well you are filling your lungs with each breath. Learning to take long, deep breaths using this tool can help you keep your lungs clear and active. This may help to reverse or lessen your chance of developing breathing (pulmonary) problems, especially infection. You may be asked to use a spirometer: After a surgery. If you have a lung problem or a history of smoking. After a long period of time when you have been unable to move or be active. If the spirometer includes an indicator to show the highest number that you have reached, your health care  provider or respiratory therapist will help you set a goal. Keep a log of your progress as told by your health care provider. What are the risks? Breathing too quickly may cause dizziness or cause you to pass out. Take your time so you do not get dizzy or light-headed. If you are in pain, you may need to take pain medicine before doing incentive spirometry. It is harder to take a deep breath if you are having pain. How to use your incentive spirometer  Sit up on the edge of your bed or on a chair. Hold the incentive spirometer so that it is in an upright position. Before you use the spirometer, breathe  out normally. Place the mouthpiece in your mouth. Make sure your lips are closed tightly around it. Breathe in slowly and as deeply as you can through your mouth, causing the piston or the ball to rise toward the top of the chamber. Hold your breath for 3-5 seconds, or for as long as possible. If the spirometer includes a coach indicator, use this to guide you in breathing. Slow down your breathing if the indicator goes above the marked areas. Remove the mouthpiece from your mouth and breathe out normally. The piston or ball will return to the bottom of the chamber. Rest for a few seconds, then repeat the steps 10 or more times. Take your time and take a few normal breaths between deep breaths so that you do not get dizzy or light-headed. Do this every 1-2 hours when you are awake. If the spirometer includes a goal marker to show the highest number you have reached (best effort), use this as a goal to work toward during each repetition. After each set of 10 deep breaths, cough a few times. This will help to make sure that your lungs are clear. If you have an incision on your chest or abdomen from surgery, place a pillow or a rolled-up towel firmly against the incision when you cough. This can help to reduce pain while taking deep breaths and coughing. General tips When you are able to get out of  bed: Walk around often. Continue to take deep breaths and cough in order to clear your lungs. Keep using the incentive spirometer until your health care provider says it is okay to stop using it. If you have been in the hospital, you may be told to keep using the spirometer at home. Contact a health care provider if: You are having difficulty using the spirometer. You have trouble using the spirometer as often as instructed. Your pain medicine is not giving enough relief for you to use the spirometer as told. You have a fever. Get help right away if: You develop shortness of breath. You develop a cough with bloody mucus from the lungs. You have fluid or blood coming from an incision site after you cough. Summary An incentive spirometer is a tool that can help you learn to take long, deep breaths to keep your lungs clear and active. You may be asked to use a spirometer after a surgery, if you have a lung problem or a history of smoking, or if you have been inactive for a long period of time. Use your incentive spirometer as instructed every 1-2 hours while you are awake. If you have an incision on your chest or abdomen, place a pillow or a rolled-up towel firmly against your incision when you cough. This will help to reduce pain. Get help right away if you have shortness of breath, you cough up bloody mucus, or blood comes from your incision when you cough. This information is not intended to replace advice given to you by your health care provider. Make sure you discuss any questions you have with your health care provider. Document Revised: 04/05/2019 Document Reviewed: 04/05/2019 Elsevier Patient Education  2023 Elsevier Inc.   POLAR CARE INFORMATION  Massadvertisement.it  How to use [facility]?  YouTube   Shippingscam.co.uk  OPERATING INSTRUCTIONS  Start the product With dry hands, connect the transformer to the electrical  connection located on the top of the cooler. Next, plug the transformer into an appropriate electrical outlet. The unit  will automatically start running at this point.  To stop the pump, disconnect electrical power.  Unplug to stop the product when not in use. Unplugging the Polar Care unit turns it off. Always unplug immediately after use. Never leave it plugged in while unattended. Remove pad.    FIRST ADD WATER TO FILL LINE, THEN ICE---Replace ice when existing ice is almost melted  1 Discuss Treatment with your Licensed Health Care Practitioner and Use Only as Prescribed 2 Apply Insulation Barrier & Cold Therapy Pad 3 Check for Moisture 4 Inspect Skin Regularly  Tips and Trouble Shooting Usage Tips 1. Use cubed or chunked ice for optimal performance. 2. It is recommended to drain the Pad between uses. To drain the pad, hold the Pad upright with the hose pointed toward the ground. Depress the black plunger and allow water to drain out. 3. You may disconnect the Pad from the unit without removing the pad from the affected area by depressing the silver tabs on the hose coupling and gently pulling the hoses apart. The Pad and unit will seal itself and will not leak. Note: Some dripping during release is normal. 4. DO NOT RUN PUMP WITHOUT WATER! The pump in this unit is designed to run with water. Running the unit without water will cause permanent damage to the pump. 5. Unplug unit before removing lid.  TROUBLESHOOTING GUIDE Pump not running, Water not flowing to the pad, Pad is not getting cold 1. Make sure the transformer is plugged into the wall outlet. 2. Confirm that the ice and water are filled to the indicated levels. 3. Make sure there are no kinks in the pad. 4. Gently pull on the blue tube to make sure the tube/pad junction is straight. 5. Remove the pad from the treatment site and ll it while the pad is lying at; then reapply. 6. Confirm that the pad couplings are securely  attached to the unit. Listen for the double clicks (Figure 1) to confirm the pad couplings are securely attached.  Leaks    Note: Some condensation on the lines, controller, and pads is unavoidable, especially in warmer climates. 1. If using a [employer] Polar Care Cold Therapy unit with a detachable Cold Therapy Pad, and a leak exists (other than condensation on the lines) disconnect the pad couplings. Make sure the silver tabs on the couplings are depressed before reconnecting the pad to the pump hose; then confirm both sides of the coupling are properly clicked in. 2. If the coupling continues to leak or a leak is detected in the pad itself, stop using it and call [employer] Customer Care at [phone].  Cleaning After use, empty and dry the unit with a soft cloth. Warm water and mild detergent may be used occasionally to clean the pump and tubes.  WARNING: The Polar Care Cube can be cold enough to cause serious injury, including full skin necrosis. Follow these Operating Instructions, and carefully read the Product Insert (see pouch on side of unit) and the Cold Therapy Pad Fitting Instructions (provided with each Cold Therapy Pad) prior to use.     Class available 11/06 :

## 2023-11-27 NOTE — Pre-Procedure Instructions (Signed)
 Attempted x 2 to reached patient to do his PAT interview and review his instructions, VM left with no return call, labs, EKG done today, he received instructions and soap, ensure and IS in preparation for his surgery. We will reschedule his call for next week.

## 2023-11-28 ENCOUNTER — Inpatient Hospital Stay: Admission: RE | Admit: 2023-11-28 | Source: Ambulatory Visit

## 2023-12-04 ENCOUNTER — Encounter: Payer: Self-pay | Admitting: Orthopedic Surgery

## 2023-12-04 ENCOUNTER — Other Ambulatory Visit: Payer: Self-pay

## 2023-12-04 ENCOUNTER — Encounter
Admission: RE | Admit: 2023-12-04 | Discharge: 2023-12-04 | Disposition: A | Discharge status: Home / Self Care | Source: Ambulatory Visit | Attending: Orthopedic Surgery | Admitting: Orthopedic Surgery

## 2023-12-04 DIAGNOSIS — Z79899 Other long term (current) drug therapy: Secondary | ICD-10-CM

## 2023-12-04 DIAGNOSIS — E876 Hypokalemia: Secondary | ICD-10-CM

## 2023-12-04 DIAGNOSIS — Z01812 Encounter for preprocedural laboratory examination: Secondary | ICD-10-CM

## 2023-12-04 HISTORY — DX: Unspecified diastolic (congestive) heart failure: I50.30

## 2023-12-04 HISTORY — DX: Cardiomegaly: I51.7

## 2023-12-04 HISTORY — DX: Pneumonia, unspecified organism: J18.9

## 2023-12-04 HISTORY — DX: Umbilical hernia without obstruction or gangrene: K42.9

## 2023-12-04 MED ORDER — POTASSIUM CHLORIDE CRYS ER 10 MEQ PO TBCR
EXTENDED_RELEASE_TABLET | ORAL | 0 refills | Status: AC
  Filled 2023-12-04: qty 6, 5d supply, fill #0
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
## 2023-12-04 NOTE — Progress Notes (Signed)
 [facility] Perioperative Services: Pre-Admission/Anesthesia Testing  Abnormal Lab Notification and Treatment Plan of Care   Date: 12/04/23  Name: Douglas Newton DOB: 06-24-1960 MRN:   [id number]  Re: Abnormal labs noted during PAT appointment   Notified:  Provider Name Provider Role Notification Mode  Lorelle Hussar, MD Orthopedics (Surgeon) Routed and/or faxed via [facility]ANE[facility] Valora Lynwood JULIANNA, MD Primary Care Provider Routed and/or faxed via [facility]   Clinical Information and Notes:  ABNORMAL LAB VALUE(S): Lab Results  Component Value Date   K 3.2 (L) 11/27/2023   Reyes LITTIE Breeding is scheduled for an elective ARTHROPLASTY, KNEE, TOTAL (Left: Knee) on 12/08/2023. In review of his medication reconciliation, it is noted that the patient is taking prescribed diuretic medications (furosemide ) daily.   Please note, in efforts to promote a safe and effective anesthetic course, per current guidelines/standards set by the [facility] anesthesia team, the minimal acceptable K+ level for the patient to proceed with general anesthesia is 3.0 mmol/L. With that being said, if the patient drops any lower, his elective procedure will need to be postponed until K+ is better optimized. In efforts to prevent case cancellation, and ultimately to promote the safety of this patient undergoing sedation/anesthesia, will make efforts to optimize pre-surgical K+ level allowing the surgical intervention to proceed as planned.    Impression and Plan:  [name] found to be HYPOkalemic at 3.2 mmol/L on preoperative labs.   He is on daily thiazide diuretic therapy. Discussed diuretic therapy as likely etiology in the absence of GI related symptoms (no diarrhea). Patient denies regular use of laxative medications. Reviewed other potential causes, including decreased intake of dietary K+ and other potentials for insensible losses. Reviewed plans for short term preoperative optimization as  follows:   Meds ordered this encounter  Medications   potassium chloride  (KLOR-CON  M) 10 MEQ tablet    Sig: Take 2 tablets (20 mEq) today, then take 1 tablet (10 mEq) daily until surgery.  Be sure to take dose on day of surgery.  Follow-up with PCP for repeat labs.    Dispense:  6 tablet    Refill:  0    Please contact the patient as soon as it is available for pickup. Rx is for preoperative K+ optimization and needs to be started ASAP.   Encouraged patient to follow up with PCP about 2-3 weeks postoperatively to have labs rechecked to ensure that levels are remaining within normal range. Discussed nutritional intake of K+ rich foods as an adjunctive way to keep his K+ levels normal; list of K+ rich foods provided. Also mentioned ORS, however advised him not to rely solely on these drinks, as they are high in Na+, and he has a HTN diagnosis.   Will send copy of this note to surgeon and PCP to make them aware of K+ level and plans for correction. Discussed that PCP may elect to pursue a change in diuretic therapy.  Of note, patient is already on MRA therapy (spironolactone ).  Could consider increasing MRA dose and discontinuing thiazide dose, or alternatively, they may consider adding a daily K+ supplement if levels remain low on recheck. Order entered to recheck K+ on the day of his surgery to ensure optimization. Wished patient the best of luck with his upcoming surgery and subsequent recovery. He was encouraged to return call to the PAT clinic, or to his surgeon's office, should any questions or concerns arise between now and the time of his surgery. Patient was  appreciative of the care/concern expressed by PAT staff.   Encounter Diagnoses  Name Primary?   Pre-operative laboratory examination Yes   Diuretic-induced hypokalemia    Long term current use of diuretic    [name], MSN, APRN, FNP-C, CEN [facility]  Perioperative Services Nurse Practitioner Phone: [phone] 12/04/23 10:09 AM  NOTE: This note has been prepared using Dragon dictation software. Despite my best ability to proofread, there is always the potential that unintentional transcriptional errors may still occur from this process.

## 2023-12-07 MED ORDER — TRANEXAMIC ACID-NACL 1000-0.7 MG/100ML-% IV SOLN
1000.0000 mg | INTRAVENOUS | Status: AC
  Administered 2023-12-08 (×2): 1000 mg via INTRAVENOUS

## 2023-12-07 MED ORDER — CEFAZOLIN SODIUM-DEXTROSE 2-4 GM/100ML-% IV SOLN
2.0000 g | INTRAVENOUS | Status: AC
Start: 2023-12-08 — End: 2023-12-09
  Administered 2023-12-08: 2 g via INTRAVENOUS

## 2023-12-07 MED ORDER — ORAL CARE MOUTH RINSE: 15.0000 mL | Freq: Once | OROMUCOSAL | Status: AC

## 2023-12-07 MED ORDER — CHLORHEXIDINE GLUCONATE 0.12 % MT SOLN
15.0000 mL | Freq: Once | OROMUCOSAL | Status: AC
  Administered 2023-12-08: 15 mL via OROMUCOSAL

## 2023-12-07 MED ORDER — LACTATED RINGERS IV SOLN: INTRAVENOUS | Status: DC

## 2023-12-07 MED ORDER — DEXAMETHASONE SOD PHOSPHATE PF 10 MG/ML IJ SOLN
8.0000 mg | Freq: Once | INTRAMUSCULAR | Status: AC
  Administered 2023-12-08: 10 mg via INTRAVENOUS

## 2023-12-08 ENCOUNTER — Encounter: Payer: Self-pay | Admitting: Orthopedic Surgery

## 2023-12-08 ENCOUNTER — Ambulatory Visit: Payer: Self-pay | Admitting: Urgent Care

## 2023-12-08 ENCOUNTER — Ambulatory Visit
Admission: RE | Admit: 2023-12-08 | Discharge: 2023-12-09 | Disposition: A | Attending: Orthopedic Surgery | Admitting: Orthopedic Surgery

## 2023-12-08 ENCOUNTER — Encounter
Admission: RE | Disposition: A | Discharge status: Home Health | Payer: Self-pay | Source: Home / Self Care | Attending: Orthopedic Surgery

## 2023-12-08 ENCOUNTER — Ambulatory Visit

## 2023-12-08 ENCOUNTER — Other Ambulatory Visit: Payer: Self-pay

## 2023-12-08 DIAGNOSIS — F17210 Nicotine dependence, cigarettes, uncomplicated: Secondary | ICD-10-CM | POA: Diagnosis not present

## 2023-12-08 DIAGNOSIS — I5033 Acute on chronic diastolic (congestive) heart failure: Secondary | ICD-10-CM | POA: Insufficient documentation

## 2023-12-08 DIAGNOSIS — J449 Chronic obstructive pulmonary disease, unspecified: Secondary | ICD-10-CM | POA: Diagnosis not present

## 2023-12-08 DIAGNOSIS — E876 Hypokalemia: Secondary | ICD-10-CM

## 2023-12-08 DIAGNOSIS — K219 Gastro-esophageal reflux disease without esophagitis: Secondary | ICD-10-CM | POA: Insufficient documentation

## 2023-12-08 DIAGNOSIS — I700 Atherosclerosis of aorta: Secondary | ICD-10-CM | POA: Insufficient documentation

## 2023-12-08 DIAGNOSIS — Z8711 Personal history of peptic ulcer disease: Secondary | ICD-10-CM | POA: Insufficient documentation

## 2023-12-08 DIAGNOSIS — Z79899 Other long term (current) drug therapy: Secondary | ICD-10-CM

## 2023-12-08 DIAGNOSIS — I2510 Atherosclerotic heart disease of native coronary artery without angina pectoris: Secondary | ICD-10-CM | POA: Insufficient documentation

## 2023-12-08 DIAGNOSIS — Z6835 Body mass index (BMI) 35.0-35.9, adult: Secondary | ICD-10-CM | POA: Diagnosis not present

## 2023-12-08 DIAGNOSIS — G40909 Epilepsy, unspecified, not intractable, without status epilepticus: Secondary | ICD-10-CM | POA: Insufficient documentation

## 2023-12-08 DIAGNOSIS — M1712 Unilateral primary osteoarthritis, left knee: Secondary | ICD-10-CM | POA: Diagnosis not present

## 2023-12-08 DIAGNOSIS — E669 Obesity, unspecified: Secondary | ICD-10-CM | POA: Diagnosis not present

## 2023-12-08 DIAGNOSIS — Z96652 Presence of left artificial knee joint: Secondary | ICD-10-CM | POA: Diagnosis not present

## 2023-12-08 DIAGNOSIS — Z23 Encounter for immunization: Secondary | ICD-10-CM | POA: Diagnosis not present

## 2023-12-08 DIAGNOSIS — F17290 Nicotine dependence, other tobacco product, uncomplicated: Secondary | ICD-10-CM | POA: Insufficient documentation

## 2023-12-08 DIAGNOSIS — I5032 Chronic diastolic (congestive) heart failure: Secondary | ICD-10-CM | POA: Diagnosis not present

## 2023-12-08 DIAGNOSIS — I6789 Other cerebrovascular disease: Secondary | ICD-10-CM | POA: Insufficient documentation

## 2023-12-08 DIAGNOSIS — R55 Syncope and collapse: Secondary | ICD-10-CM | POA: Diagnosis present

## 2023-12-08 DIAGNOSIS — I110 Hypertensive heart disease with heart failure: Secondary | ICD-10-CM | POA: Diagnosis not present

## 2023-12-08 DIAGNOSIS — Z7982 Long term (current) use of aspirin: Secondary | ICD-10-CM | POA: Diagnosis not present

## 2023-12-08 DIAGNOSIS — Z01812 Encounter for preprocedural laboratory examination: Secondary | ICD-10-CM

## 2023-12-08 DIAGNOSIS — R7989 Other specified abnormal findings of blood chemistry: Secondary | ICD-10-CM | POA: Diagnosis not present

## 2023-12-08 DIAGNOSIS — I422 Other hypertrophic cardiomyopathy: Secondary | ICD-10-CM | POA: Insufficient documentation

## 2023-12-08 HISTORY — DX: Residual foreign body in soft tissue: M79.5

## 2023-12-08 HISTORY — DX: Chronic obstructive pulmonary disease, unspecified: J44.9

## 2023-12-08 HISTORY — DX: Other hypertrophic cardiomyopathy: I42.2

## 2023-12-08 HISTORY — DX: Osteophyte, vertebrae: M25.78

## 2023-12-08 HISTORY — DX: Other cervical disc degeneration, unspecified cervical region: M50.30

## 2023-12-08 HISTORY — DX: Hypokalemia: E87.6

## 2023-12-08 HISTORY — DX: Other cerebrovascular disease: I67.89

## 2023-12-08 HISTORY — PX: TOTAL KNEE ARTHROPLASTY: SHX125

## 2023-12-08 HISTORY — DX: Other intervertebral disc degeneration, thoracic region: M51.34

## 2023-12-08 SURGERY — ARTHROPLASTY, KNEE, TOTAL
Anesthesia: Spinal | Site: Knee | Laterality: Left

## 2023-12-08 MED ORDER — PROPOFOL 500 MG/50ML IV EMUL
INTRAVENOUS | Status: DC | PRN
  Administered 2023-12-08: 125 ug/kg/min via INTRAVENOUS

## 2023-12-08 MED ORDER — ONDANSETRON HCL 4 MG/2ML IJ SOLN
INTRAMUSCULAR | Status: DC | PRN
  Administered 2023-12-08: 4 mg via INTRAVENOUS

## 2023-12-08 MED ORDER — OXYCODONE HCL 5 MG/5ML PO SOLN: 5.0000 mg | Freq: Once | ORAL | Status: DC | PRN

## 2023-12-08 MED ORDER — ROCURONIUM BROMIDE 100 MG/10ML IV SOLN
INTRAVENOUS | Status: DC | PRN
  Administered 2023-12-08: 70 mg via INTRAVENOUS
  Administered 2023-12-08 (×2): 30 mg via INTRAVENOUS

## 2023-12-08 MED ORDER — SODIUM CHLORIDE 0.9 % IR SOLN
Status: DC | PRN
  Administered 2023-12-08: 3000 mL

## 2023-12-08 MED ORDER — PHENOL 1.4 % MT LIQD: 1.0000 | OROMUCOSAL | Status: DC | PRN

## 2023-12-08 MED ORDER — KETOROLAC TROMETHAMINE 15 MG/ML IJ SOLN
7.5000 mg | Freq: Four times a day (QID) | INTRAMUSCULAR | Status: DC
  Administered 2023-12-08 – 2023-12-09 (×2): 7.5 mg via INTRAVENOUS
  Filled 2023-12-08 (×2): qty 1

## 2023-12-08 MED ORDER — CARBAMAZEPINE 200 MG PO TABS
200.0000 mg | ORAL_TABLET | Freq: Four times a day (QID) | ORAL | Status: DC
  Administered 2023-12-09: 200 mg via ORAL
  Filled 2023-12-08 (×4): qty 1

## 2023-12-08 MED ORDER — ACETAMINOPHEN 10 MG/ML IV SOLN
INTRAVENOUS | Status: DC | PRN
  Administered 2023-12-08: 1000 mg via INTRAVENOUS

## 2023-12-08 MED ORDER — PROPOFOL 10 MG/ML IV BOLUS
INTRAVENOUS | Status: DC | PRN
  Administered 2023-12-08: 100 mg via INTRAVENOUS

## 2023-12-08 MED ORDER — METOCLOPRAMIDE HCL 5 MG/ML IJ SOLN: 5.0000 mg | Freq: Three times a day (TID) | INTRAMUSCULAR | Status: DC | PRN

## 2023-12-08 MED ORDER — FENTANYL CITRATE (PF) 100 MCG/2ML IJ SOLN
INTRAMUSCULAR | Status: AC
  Filled 2023-12-08: qty 2

## 2023-12-08 MED ORDER — MIDAZOLAM HCL 2 MG/2ML IJ SOLN
INTRAMUSCULAR | Status: AC
  Filled 2023-12-08: qty 2

## 2023-12-08 MED ORDER — SUGAMMADEX SODIUM 200 MG/2ML IV SOLN
INTRAVENOUS | Status: DC | PRN
  Administered 2023-12-08: 200 mg via INTRAVENOUS

## 2023-12-08 MED ORDER — KETOROLAC TROMETHAMINE 30 MG/ML IJ SOLN
INTRAMUSCULAR | Status: AC
  Filled 2023-12-08: qty 1

## 2023-12-08 MED ORDER — SODIUM CHLORIDE (PF) 0.9 % IJ SOLN
INTRAMUSCULAR | Status: DC | PRN
  Administered 2023-12-08: 70 mL via INTRAMUSCULAR

## 2023-12-08 MED ORDER — PHENYLEPHRINE HCL-NACL 20-0.9 MG/250ML-% IV SOLN
INTRAVENOUS | Status: DC | PRN
  Administered 2023-12-08: 40 ug/min via INTRAVENOUS

## 2023-12-08 MED ORDER — DROPERIDOL 2.5 MG/ML IJ SOLN: 0.6250 mg | Freq: Once | INTRAMUSCULAR | Status: DC | PRN

## 2023-12-08 MED ORDER — PROPOFOL 1000 MG/100ML IV EMUL
INTRAVENOUS | Status: AC
  Filled 2023-12-08: qty 100

## 2023-12-08 MED ORDER — CARVEDILOL 3.125 MG PO TABS
12.5000 mg | ORAL_TABLET | Freq: Two times a day (BID) | ORAL | Status: DC
  Administered 2023-12-09: 12.5 mg via ORAL
  Filled 2023-12-08: qty 4

## 2023-12-08 MED ORDER — CEFAZOLIN SODIUM-DEXTROSE 2-4 GM/100ML-% IV SOLN
2.0000 g | Freq: Four times a day (QID) | INTRAVENOUS | Status: AC
  Administered 2023-12-08 – 2023-12-09 (×2): 2 g via INTRAVENOUS
  Filled 2023-12-08 (×2): qty 100

## 2023-12-08 MED ORDER — HYDROMORPHONE HCL 1 MG/ML IJ SOLN
INTRAMUSCULAR | Status: DC | PRN
  Administered 2023-12-08 (×2): .5 mg via INTRAVENOUS

## 2023-12-08 MED ORDER — MIDAZOLAM HCL (PF) 2 MG/2ML IJ SOLN
INTRAMUSCULAR | Status: DC | PRN
  Administered 2023-12-08: 2 mg via INTRAVENOUS

## 2023-12-08 MED ORDER — PANTOPRAZOLE SODIUM 40 MG PO TBEC
40.0000 mg | DELAYED_RELEASE_TABLET | Freq: Every day | ORAL | Status: DC
  Administered 2023-12-09: 40 mg via ORAL
  Filled 2023-12-08: qty 1

## 2023-12-08 MED ORDER — BUPIVACAINE-EPINEPHRINE (PF) 0.25% -1:200000 IJ SOLN
INTRAMUSCULAR | Status: AC
  Filled 2023-12-08: qty 30

## 2023-12-08 MED ORDER — PHENYLEPHRINE HCL (PRESSORS) 10 MG/ML IV SOLN
INTRAVENOUS | Status: AC
  Filled 2023-12-08: qty 1

## 2023-12-08 MED ORDER — PROPOFOL 10 MG/ML IV BOLUS
INTRAVENOUS | Status: AC
  Filled 2023-12-08: qty 20

## 2023-12-08 MED ORDER — ONDANSETRON HCL 4 MG/2ML IJ SOLN
INTRAMUSCULAR | Status: AC
  Filled 2023-12-08: qty 2

## 2023-12-08 MED ORDER — ACETAMINOPHEN 325 MG PO TABS: 325.0000 mg | ORAL_TABLET | Freq: Four times a day (QID) | ORAL | Status: DC | PRN

## 2023-12-08 MED ORDER — MENTHOL 3 MG MT LOZG: 1.0000 | LOZENGE | OROMUCOSAL | Status: DC | PRN

## 2023-12-08 MED ORDER — HYDROCODONE-ACETAMINOPHEN 5-325 MG PO TABS: 1.0000 | ORAL_TABLET | ORAL | Status: DC | PRN

## 2023-12-08 MED ORDER — CEFAZOLIN SODIUM-DEXTROSE 2-4 GM/100ML-% IV SOLN
INTRAVENOUS | Status: AC
  Filled 2023-12-08: qty 100

## 2023-12-08 MED ORDER — FLUTICASONE FUROATE-VILANTEROL 200-25 MCG/ACT IN AEPB
1.0000 | INHALATION_SPRAY | Freq: Every day | RESPIRATORY_TRACT | Status: DC
  Administered 2023-12-09: 1 via RESPIRATORY_TRACT
  Filled 2023-12-08: qty 28

## 2023-12-08 MED ORDER — KETOROLAC TROMETHAMINE 30 MG/ML IJ SOLN
INTRAMUSCULAR | Status: DC | PRN
Start: 2023-12-08 — End: 2023-12-08
  Administered 2023-12-08: 30 mg

## 2023-12-08 MED ORDER — TRANEXAMIC ACID-NACL 1000-0.7 MG/100ML-% IV SOLN
INTRAVENOUS | Status: AC
  Filled 2023-12-08: qty 100

## 2023-12-08 MED ORDER — CHLORHEXIDINE GLUCONATE 0.12 % MT SOLN
OROMUCOSAL | Status: AC
  Filled 2023-12-08: qty 15

## 2023-12-08 MED ORDER — HYDROMORPHONE HCL 1 MG/ML IJ SOLN
INTRAMUSCULAR | Status: AC
  Filled 2023-12-08: qty 1

## 2023-12-08 MED ORDER — ONDANSETRON HCL 4 MG PO TABS: 4.0000 mg | ORAL_TABLET | Freq: Four times a day (QID) | ORAL | Status: DC | PRN

## 2023-12-08 MED ORDER — MORPHINE SULFATE (PF) 4 MG/ML IV SOLN: 0.5000 mg | INTRAVENOUS | Status: DC | PRN

## 2023-12-08 MED ORDER — CEFAZOLIN SODIUM-DEXTROSE 2-4 GM/100ML-% IV SOLN
INTRAVENOUS | Status: AC
Start: 2023-12-08 — End: 2023-12-08
  Filled 2023-12-08: qty 100

## 2023-12-08 MED ORDER — SODIUM CHLORIDE 0.9 % IV SOLN: INTRAVENOUS | Status: DC

## 2023-12-08 MED ORDER — ALBUTEROL SULFATE (2.5 MG/3ML) 0.083% IN NEBU: 2.5000 mg | INHALATION_SOLUTION | Freq: Four times a day (QID) | RESPIRATORY_TRACT | Status: DC | PRN

## 2023-12-08 MED ORDER — TRAMADOL HCL 50 MG PO TABS
50.0000 mg | ORAL_TABLET | Freq: Four times a day (QID) | ORAL | Status: DC | PRN
  Administered 2023-12-09: 50 mg via ORAL
  Filled 2023-12-08: qty 1

## 2023-12-08 MED ORDER — DOCUSATE SODIUM 100 MG PO CAPS
100.0000 mg | ORAL_CAPSULE | Freq: Two times a day (BID) | ORAL | Status: DC
  Administered 2023-12-09: 100 mg via ORAL
  Filled 2023-12-08: qty 1

## 2023-12-08 MED ORDER — FUROSEMIDE 20 MG PO TABS: 40.0000 mg | ORAL_TABLET | Freq: Every evening | ORAL | Status: DC

## 2023-12-08 MED ORDER — FENTANYL CITRATE (PF) 100 MCG/2ML IJ SOLN
25.0000 ug | INTRAMUSCULAR | Status: DC | PRN
  Administered 2023-12-08 (×6): 25 ug via INTRAVENOUS

## 2023-12-08 MED ORDER — BUPIVACAINE LIPOSOME 1.3 % IJ SUSP
INTRAMUSCULAR | Status: AC
  Filled 2023-12-08: qty 20

## 2023-12-08 MED ORDER — SURGIRINSE WOUND IRRIGATION SYSTEM - OPTIME
TOPICAL | Status: DC | PRN
Start: 2023-12-08 — End: 2023-12-08

## 2023-12-08 MED ORDER — ACETAMINOPHEN 10 MG/ML IV SOLN: 1000.0000 mg | Freq: Once | INTRAVENOUS | Status: DC | PRN

## 2023-12-08 MED ORDER — METOCLOPRAMIDE HCL 5 MG PO TABS: 5.0000 mg | ORAL_TABLET | Freq: Three times a day (TID) | ORAL | Status: DC | PRN

## 2023-12-08 MED ORDER — INSULIN ASPART 100 UNIT/ML IJ SOLN: 0.0000 [IU] | Freq: Three times a day (TID) | INTRAMUSCULAR | Status: DC

## 2023-12-08 MED ORDER — ACETAMINOPHEN 500 MG PO TABS
1000.0000 mg | ORAL_TABLET | Freq: Three times a day (TID) | ORAL | Status: DC
  Administered 2023-12-08: 500 mg via ORAL
  Administered 2023-12-09: 1000 mg via ORAL
  Filled 2023-12-08 (×2): qty 2

## 2023-12-08 MED ORDER — ENOXAPARIN SODIUM 30 MG/0.3ML IJ SOSY
30.0000 mg | PREFILLED_SYRINGE | Freq: Two times a day (BID) | INTRAMUSCULAR | Status: DC
  Administered 2023-12-09: 30 mg via SUBCUTANEOUS
  Filled 2023-12-08: qty 0.3

## 2023-12-08 MED ORDER — LIDOCAINE HCL (PF) 2 % IJ SOLN
INTRAMUSCULAR | Status: AC
  Filled 2023-12-08: qty 10

## 2023-12-08 MED ORDER — LIDOCAINE HCL (CARDIAC) PF 100 MG/5ML IV SOSY
PREFILLED_SYRINGE | INTRAVENOUS | Status: DC | PRN
  Administered 2023-12-08: 100 mg via INTRAVENOUS

## 2023-12-08 MED ORDER — OXYCODONE HCL 5 MG PO TABS: 5.0000 mg | ORAL_TABLET | Freq: Once | ORAL | Status: DC | PRN

## 2023-12-08 MED ORDER — INSULIN ASPART 100 UNIT/ML IJ SOLN: 0.0000 [IU] | Freq: Every day | INTRAMUSCULAR | Status: DC

## 2023-12-08 MED ORDER — ACETAMINOPHEN 10 MG/ML IV SOLN
INTRAVENOUS | Status: AC
  Filled 2023-12-08: qty 100

## 2023-12-08 MED ORDER — SODIUM CHLORIDE (PF) 0.9 % IJ SOLN
INTRAMUSCULAR | Status: AC
  Filled 2023-12-08: qty 20

## 2023-12-08 MED ORDER — FENTANYL CITRATE (PF) 100 MCG/2ML IJ SOLN
INTRAMUSCULAR | Status: DC | PRN
  Administered 2023-12-08 (×2): 50 ug via INTRAVENOUS

## 2023-12-08 MED ORDER — LACTATED RINGERS IV SOLN: INTRAVENOUS | Status: DC | PRN

## 2023-12-08 MED ORDER — SACUBITRIL-VALSARTAN 49-51 MG PO TABS
1.0000 | ORAL_TABLET | Freq: Two times a day (BID) | ORAL | Status: DC
  Administered 2023-12-09: 1 via ORAL
  Filled 2023-12-08 (×2): qty 1

## 2023-12-08 MED ORDER — SPIRONOLACTONE 25 MG PO TABS
25.0000 mg | ORAL_TABLET | Freq: Every evening | ORAL | Status: DC
  Filled 2023-12-08: qty 1

## 2023-12-08 MED ORDER — ONDANSETRON HCL 4 MG/2ML IJ SOLN: 4.0000 mg | Freq: Four times a day (QID) | INTRAMUSCULAR | Status: DC | PRN

## 2023-12-08 SURGICAL SUPPLY — 57 items
BLADE PATELLA W-PILOT HOLE 35 (BLADE) IMPLANT
BLADE SAW 90X13X1.19 OSCILLAT (BLADE) IMPLANT
BLADE SAW SAG 25X90X1.19 (BLADE) ×1 IMPLANT
BLADE SAW SAG 29X58X.64 (BLADE) ×1 IMPLANT
BNDG ELASTIC 6INX 5YD STR LF (GAUZE/BANDAGES/DRESSINGS) ×1 IMPLANT
BOWL CEMENT MIX W/ADAPTER (MISCELLANEOUS) IMPLANT
BRUSH SCRUB EZ PLAIN DRY (MISCELLANEOUS) IMPLANT
CHLORAPREP W/TINT 26 (MISCELLANEOUS) ×2 IMPLANT
COMPONENT FEM KNEE STD PS 8 LT (Joint) IMPLANT
COMPONENT PATELLA 3 PEG 35 (Joint) IMPLANT
COMPONENT TIB KNEE PS 0D LT (Joint) IMPLANT
COOLER ICEMAN CLASSIC (MISCELLANEOUS) ×1 IMPLANT
CUFF TRNQT CYL 24X4X16.5-23 (TOURNIQUET CUFF) IMPLANT
CUFF TRNQT CYL 30X4X21-28X (TOURNIQUET CUFF) IMPLANT
DERMABOND ADVANCED .7 DNX12 (GAUZE/BANDAGES/DRESSINGS) ×1 IMPLANT
DRAPE SHEET LG 3/4 BI-LAMINATE (DRAPES) ×2 IMPLANT
DRSG MEPILEX SACRM 8.7X9.8 (GAUZE/BANDAGES/DRESSINGS) ×1 IMPLANT
DRSG OPSITE POSTOP 4X10 (GAUZE/BANDAGES/DRESSINGS) IMPLANT
DRSG OPSITE POSTOP 4X8 (GAUZE/BANDAGES/DRESSINGS) IMPLANT
ELECTRODE REM PT RTRN 9FT ADLT (ELECTROSURGICAL) ×1 IMPLANT
GLOVE BIO SURGEON STRL SZ8 (GLOVE) ×1 IMPLANT
GLOVE BIOGEL PI IND STRL 8 (GLOVE) ×1 IMPLANT
GLOVE PI ORTHO PRO STRL 7.5 (GLOVE) ×2 IMPLANT
GLOVE PI ORTHO PRO STRL SZ8 (GLOVE) ×2 IMPLANT
GLOVE SURG SYN 7.5 PF PI (GLOVE) ×1 IMPLANT
GOWN SRG XL LONG LVL 3 NONREIN (GOWNS) ×1 IMPLANT
GOWN SRG XL LVL 3 NONREINFORCE (GOWNS) ×1 IMPLANT
GOWN STRL REUS W/ TWL LRG LVL3 (GOWN DISPOSABLE) ×1 IMPLANT
HOOD PEEL AWAY T7 (MISCELLANEOUS) ×2 IMPLANT
KIT TURNOVER KIT A (KITS) ×1 IMPLANT
MANIFOLD NEPTUNE II (INSTRUMENTS) ×1 IMPLANT
MARKER SKIN DUAL TIP RULER LAB (MISCELLANEOUS) ×1 IMPLANT
MAT ABSORB FLUID 56X50 GRAY (MISCELLANEOUS) ×1 IMPLANT
NDL HYPO 21X1.5 SAFETY (NEEDLE) ×1 IMPLANT
NEEDLE HYPO 21X1.5 SAFETY (NEEDLE) ×1 IMPLANT
PACK TOTAL KNEE (MISCELLANEOUS) ×1 IMPLANT
PAD ARMBOARD POSITIONER FOAM (MISCELLANEOUS) ×3 IMPLANT
PAD COLD UNI WRAP-ON (PAD) ×1 IMPLANT
PENCIL SMOKE EVACUATOR (MISCELLANEOUS) ×1 IMPLANT
PIN DRILL HDLS TROCAR 75 4PK (PIN) IMPLANT
SCREW FEMALE HEX FIX 25X2.5 (ORTHOPEDIC DISPOSABLE SUPPLIES) IMPLANT
SCREW HEX HEADED 3.5X27 DISP (ORTHOPEDIC DISPOSABLE SUPPLIES) IMPLANT
SLEEVE SCD COMPRESS KNEE MED (STOCKING) ×1 IMPLANT
SOL .9 NS 3000ML IRR UROMATIC (IV SOLUTION) ×1 IMPLANT
SOLN STERILE WATER BTL 1000 ML (IV SOLUTION) ×1 IMPLANT
SOLUTION IRRIG SURGIPHOR (IV SOLUTION) ×1 IMPLANT
STEM TIBIAL 10 8-11 EF POLY LT (Joint) IMPLANT
STOCKINETTE IMPERV 14X48 (MISCELLANEOUS) ×1 IMPLANT
SUT STRATAFIX 14 PDO 36 VLT (SUTURE) ×1 IMPLANT
SUT VIC AB 0 CT1 36 (SUTURE) ×1 IMPLANT
SUT VIC AB 2-0 CT2 27 (SUTURE) ×2 IMPLANT
SUTURE STRATA SPIR 4-0 18 (SUTURE) ×1 IMPLANT
SUTURE VICRYL 1-0 27IN ABS (SUTURE) ×1 IMPLANT
SYR 20ML LL LF (SYRINGE) ×2 IMPLANT
TAPE CLOTH 3X10 WHT NS LF (GAUZE/BANDAGES/DRESSINGS) ×1 IMPLANT
TIP FAN IRRIG PULSAVAC PLUS (DISPOSABLE) ×1 IMPLANT
TRAP FLUID SMOKE EVACUATOR (MISCELLANEOUS) ×1 IMPLANT

## 2023-12-08 NOTE — Op Note (Signed)
 Patient Name: Douglas Newton  FMW:[id number]  Pre-Operative Diagnosis: Left knee Osteoarthritis  Post-Operative Diagnosis: (same)  Procedure: Left Total Knee Arthroplasty  Components/Implants: Femur: Persona Size 8 CR PPS   Tibia: Persona Size F OsseoTi  Poly: 10mm MC  Patella: 35x9.73mm symmetric OsseoTi  Femoral Valgus Cut Angle: 5 degrees  Distal Femoral Re-cut: none  Patella Resurfacing: yes   Date of Surgery: 12/08/2023  Surgeon: Arthea Sheer MD  Assistant: Debby Amber PA (present and scrubbed throughout the case, critical for assistance with exposure, retraction, instrumentation, and closure)   Anesthesiologist: Adams  Anesthesia: Spinal   Tourniquet Time: 56 min  EBL: 25cc  IVF: 800cc  Complications: None   Brief history: The patient is a 63 year old male with a history of osteoarthritis of the left knee with pain limiting their range of motion and activities of daily living, which has failed multiple attempts at conservative therapy.  The risks and benefits of total knee arthroplasty as definitive surgical treatment were discussed with the patient, who opted to proceed with the operation.  After outpatient medical clearance and optimization was completed the patient was admitted to [facility] for the procedure.  All preoperative films were reviewed and an appropriate surgical plan was made prior to surgery. Preoperative range of motion was 5 to 105 with a 5 degree flexion contracture. The patient was identified as having a varus alignment.   Description of procedure: The patient was brought to the operating room where laterality was confirmed by all those present to be the leftside.   Spinal anesthesia was administered and the patient received an intravenous dose of antibiotics for surgical prophylaxis and a dose of tranexamic acid.  Patient is positioned supine on the operating room table with all bony prominences well-padded.  A  well-padded tourniquet was applied to the left thigh.  The knee was then prepped and draped in usual sterile fashion with multiple layers of adhesive and nonadhesive drapes.  All of those present in the operating room participated in a surgical timeout laterality and patient were confirmed.   An Esmarch was wrapped around the extremity and the leg was elevated and the knee flexed.  The tourniquet was inflated to a pressure of 250 mmHg. The Esmarch was removed and the leg was brought down to full extension.  The patella and tibial tubercle identified and outlined using a marking pen and a midline skin incision was made with a knife carried through the subcutaneous tissue down to the extensor retinaculum.  After exposure of the extensor mechanism the medial parapatellar arthrotomy was performed with a scalpel and electrocautery extending down medial and distal to the tibial tubercle taking care to avoid incising the patellar tendon.   A standard medial release was performed over the proximal tibia.  The knee was brought into extension in order to excise the fat pad taking care not to damage the patella tendon.  The superior soft tissue was removed from the anterior surface of the distal femur to visualize for the procedure.  The knee was then brought into flexion with the patella subluxed laterally and subluxing the tibia anteriorly.  The ACL was transected and removed with electrocautery and additional soft tissue was removed from the proximal surface of the tibia to fully expose. The PCL was found to be partially intact and was preserved.  An extramedullary tibial cutting guide was then applied to the leg with a spring-loaded ankle clamp placed around the distal tibia just above the malleoli the angulation of  the guide was adjusted to give some posterior slope in the tibial resection with an appropriate varus/valgus alignment.  The resection guide was then pinned to the proximal tibia and the proximal tibial  surface was resected with an oscillating saw.  Careful attention was paid to ensure the blade did not disrupt any of the soft tissues including any lateral or medial ligament.  Attention was then turned to the femur, with the knee slightly flexed a opening drill was used to enter the medullary canal of the femur.  After removing the drill marrow was suctioned out to decompress the distal femur.  An intramedullary femoral guide was then inserted into the drill hole and the alignment guide was seated firmly against the distal end of the medial femoral condyle.  The distal femoral cutting guide was then attached and pinned securely to the anterior surface of the femur and the intramedullary rod and alignment guide was removed.  Distal femur resection was then performed with an oscillating saw with retractors protecting medial and laterally.   The distal cutting block was then removed and the extension gap was checked with a spacer.  Extension gap was found to be appropriately sized to accommodate the spacer block.   The femoral sizing guide was then placed securely into the posterior condyles of the femur and the femoral size was measured and determined to be 8.  The size 8; 4-in-1 cutting guide was placed in position and secured with 2 pins.  The anterior posterior and chamfer resections were then performed with an oscillating saw.  Bony fragments and osteophytes were then removed.  Using a lamina spreader the posterior medial and lateral condyles were checked for additional osteophytes and posterior soft tissue remnants.  Any remaining meniscus was removed at this time.  Periarticular injection was performed in the meniscal rims and posterior capsule with aspiration performed to ensure no intravascular injection.   The tibia was then exposed and the tibial trial was pinned onto the plateau after confirming appropriate orientation and rotation.  Using the drill bushing the tibia was prepared to the appropriate  drill depth.  Tibial broach impactor was then driven through the punch guide using a mallet.  The femoral trial component was then inserted onto the femur. A trial tibial polyethylene bearing was then placed and the knee was reduced.  The knee achieved full extension with no hyperextension and was found to be balanced in flexion and extension with the trials in place.  The knee was then brought into full extension the patella was everted and held with 2 Kocher clamps.  The articular surface of the patella was then resected with an patella reamer and saw after careful measurement with a caliper.  The patella was then prepared with the drill guide and a trial patella was placed.  The knee was then taken through range of motion and it was found that the patella articulated appropriately with the trochlea and good patellofemoral motion without subluxation.    The correct final components for implantation were confirmed and opened by the circulator nurse.  The knee was irrigated with normal saline via pulsatile lavage to remove any bony debris or soft tissue.  The prepared surface of the tibia was exposed and the tibial component was implanted with good bony contact.  The femoral component was then placed and impacted showing good coverage and a good snug fit.  The patella was then cleared off and the patella compression tool was used to apply the patellar component  with symmetric compression onto the patella.  The tibial component was then irrigated and cleared of any debris and a real polyethylene component was placed and engaged with the locking mechanism.  The knee was then injected with the particular cocktail.  The knee was taken through range of motion and found to be stable in flexion and extension with patellar tracking.  The knee was then irrigated with copious amount of normal saline via pulsatile lavage to remove all loose bodies and other debris.  The knee was then irrigated with surgiphor betadine based  wash and reirrigated with saline.  The tourniquet was then dropped and all bleeding vessels were identified and coagulated.  The arthrotomy was approximated with #1 Vicryl and closed with #1 Stratafix suture.  The knee was brought into slight flexion and the subcutaneous tissues were closed with 0 Vicryl, 2-0 Vicryl and a running subcuticular 4-0 stratafix barbed suture.  Skin was then glued with Dermabond.  A sterile adhesive dressing was then placed along with a sequential compression device to the calf, a Ted stocking, and a cryotherapy cuff.   Sponge, needle, and Lap counts were all correct at the end of the case.   The patient was transferred off of the operating room table to a hospital bed, good pulses were found distally on the operative side.  The patient was transferred to the recovery room in stable condition.

## 2023-12-08 NOTE — Anesthesia Procedure Notes (Addendum)
 Procedure Name: Intubation Date/Time: 12/08/2023 1:49 PM  Performed by: Estelle Merck, Hipolito, RNPre-anesthesia Checklist: Patient identified, Patient being monitored, Timeout performed, Emergency Drugs available and Suction available Patient Re-evaluated:Patient Re-evaluated prior to induction Oxygen Delivery Method: Circle system utilized Preoxygenation: Pre-oxygenation with 100% oxygen Induction Type: IV induction Ventilation: Mask ventilation without difficulty and Oral airway inserted - appropriate to patient size Laryngoscope Size: McGrath and 4 Grade View: Grade III Tube type: Oral Tube size: 7.0 mm Number of attempts: 1 Airway Equipment and Method: Stylet Placement Confirmation: ETT inserted through vocal cords under direct vision, positive ETCO2 and breath sounds checked- equal and bilateral Secured at: 21 cm Tube secured with: Tape Dental Injury: Teeth and Oropharynx as per pre-operative assessment  Comments: Atraumatic

## 2023-12-08 NOTE — Anesthesia Postprocedure Evaluation (Signed)
 Anesthesia Post Note  Patient: Douglas Newton  Procedure(s) Performed: ARTHROPLASTY, KNEE, TOTAL (Left: Knee)  Patient location during evaluation: PACU Anesthesia Type: Spinal Level of consciousness: awake and alert Pain management: pain level controlled Vital Signs Assessment: post-procedure vital signs reviewed and stable Respiratory status: spontaneous breathing, nonlabored ventilation, respiratory function stable and patient connected to nasal cannula oxygen Cardiovascular status: blood pressure returned to baseline and stable Postop Assessment: no apparent nausea or vomiting Anesthetic complications: no   No notable events documented.   Last Vitals:  Vitals:   12/08/23 1700 12/08/23 1715  BP: (!) 98/57 111/68  Pulse: 86 93  Resp: 18 14  Temp:    SpO2: 92% 96%    Last Pain:  Vitals:   12/08/23 1645  PainSc: 5                  Debby Mines
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
## 2023-12-08 NOTE — Progress Notes (Signed)
 PT Cancellation Note  Patient Details Name: Douglas Newton MRN: [id number] DOB: 03/04/1960   Cancelled Treatment:    Reason Eval/Treat Not Completed: Per nursing patient requested to hold PT evaluation this date and to be evaluated tomorrow morning on POD#1 due to elevated knee pain.  Will attempt to see pt at a future date/time as medically appropriate.     CHARM Glendia Bertin PT, DPT 12/08/23, 4:42 PM

## 2023-12-08 NOTE — Progress Notes (Signed)
 Patient is not able to walk the distance required to go the bathroom, or he is unable to safely negotiate stairs required to access the bathroom.  A 3in1 BSC will alleviate this problem.        Lollie Marrow, PA-C [facility]

## 2023-12-08 NOTE — Interval H&P Note (Signed)
 Patient history and physical updated. Consent reviewed including risks, benefits, and alternatives to surgery. Patient agrees with above plan to proceed with left total knee arthroplasty.

## 2023-12-08 NOTE — Transfer of Care (Signed)
 Immediate Anesthesia Transfer of Care Note  Patient: Douglas Newton  Procedure(s) Performed: ARTHROPLASTY, KNEE, TOTAL (Left: Knee)  Patient Location: PACU  Anesthesia Type:General  Level of Consciousness: drowsy and patient cooperative  Airway & Oxygen Therapy: Patient Spontanous Breathing and Patient connected to face mask oxygen  Post-op Assessment: Report given to RN and Post -op Vital signs reviewed and stable  Post vital signs: Reviewed and stable  Last Vitals:  Vitals Value Taken Time  BP 162/88 12/08/23 15:48  Temp    Pulse 78 12/08/23 15:53  Resp 14 12/08/23 15:53  SpO2 100 % 12/08/23 15:53  Vitals shown include unfiled device data.  Last Pain:  Vitals:   12/08/23 1143  PainSc: 3          Complications: No notable events documented.

## 2023-12-08 NOTE — Discharge Instructions (Signed)

## 2023-12-08 NOTE — H&P (Signed)
 History of Present Illness: The patient is an 63 y.o. male is here today for history and physical for left total knee arthroplasty with Dr. Lorelle on 12/08/2023. Patient has severe left knee osteoarthritis with complete loss of joint space in the left knee. He has had greater than 10 years of left knee pain that has gradually increased to the point where it is interfering with his quality of life and activities daily living. He has had multiple forms of conservative treatment consisting of physical therapy, steroid injections as well as medications with little relief. His last steroid injections only gave him 1-1/2 days worth of relief. His pain is 10 out of 10 and located along the medial and anterior aspect of the left knee. He has grinding, catching and occasional instability. He is having a hard time going up and down steps. The patient denies fevers, chills, numbness, tingling, shortness of breath, chest pain, recent illness, or any trauma.  Patient is a non-smoker nondiabetic with a A1c from [facility] of 5.1 per report and a BMI of 35.2  Past Medical History: Past Medical History:  Diagnosis Date  Arthritis 2011  Chickenpox  Colon polyp 01/15/2012  Diarrhea  GERD (gastroesophageal reflux disease)  Hyperlipidemia  Hypertension  Obesity  Seizure disorder (CMS/HHS-HCC) 1965  Syncope and collapse  Umbilical hernia  Ventral hernia   Past Surgical History: Past Surgical History:  Procedure Laterality Date  abdominal surgery 1965  KNEE ARTHROSCOPY Right 01/28/1981  REPAIR INCISIONAL/VENTRAL HERNIA 12/2011  COLONOSCOPY 01/15/2012  COLONOSCOPY 12/04/2016  COLONOSCOPY 12/17/2019  Tubulovillous adenoma/Tubular adenomas/PHx CP/Repeat 66yrs/JWB (11/26/2022 Recall letter returned.awb)  Colon @ [facility] 03/28/2023  Tubular adenomas/PHx CP/Repeat 77yrs/SMR  left knee surgery in August 2011  Removal of 1/3 of right lung, 1965  Removal of wires from underarm, 1970  Tracheotomy, 1965  Tube in  stomach, 1965   Past Family History: Family History  Problem Relation Age of Onset  Brain cancer Mother  neuroblastoma  Myocardial Infarction (Heart attack) Mother  Myocardial Infarction (Heart attack) Maternal Grandmother  Myocardial Infarction (Heart attack) Maternal Grandfather  High blood pressure (Hypertension) Paternal Grandmother  High blood pressure (Hypertension) Paternal Grandfather   Medications: Current Outpatient Medications  Medication Sig Dispense Refill  albuterol  MDI, PROVENTIL , VENTOLIN , PROAIR , HFA 90 mcg/actuation inhaler USE 2 INHALATIONS BY MOUTH EVERY 6 HOURS AS NEEDED 17 g 6  BREO ELLIPTA  200-25 mcg/dose DsDv Inhale 1 Puff into the lungs once daily 180 each 3  carBAMazepine  (TEGRETOL ) 200 mg tablet TAKE 1 TABLET BY MOUTH 4 TIMES DAILY 360 tablet 3  carvediloL  (COREG ) 12.5 MG tablet TAKE 1 TABLET BY MOUTH TWICE DAILY WITH MEALS 180 tablet 3  dapagliflozin  propanediol (FARXIGA ) 10 mg tablet Take 1 tablet (10 mg total) by mouth once daily 90 tablet 3  fluticasone  propionate (FLONASE ) 50 mcg/actuation nasal spray USE 2 SPRAYS INTO BOTH NOSTRILS ONCE A DAY AS NEEDED 48 g 3  FUROsemide  (LASIX ) 40 MG tablet Take 1 tablet (40 mg total) by mouth once daily 90 tablet 3  sacubitriL -valsartan  (ENTRESTO ) 49-51 mg tablet Take 1 tablet by mouth 2 (two) times daily  sildenafiL (VIAGRA) 100 MG tablet TAKE 1 TABLET BY MOUTH ONCE DAILY AS NEEDED FOR ERECTILE DYSFUNCTION 18 tablet 0  spironolactone  (ALDACTONE ) 25 MG tablet Take 1 tablet (25 mg total) by mouth once daily 90 tablet 3   No current facility-administered medications for this visit.   Allergies: No Known Allergies   Visit Vitals: Vitals:  11/26/23 0832  BP: 122/78    Review  of Systems:  A comprehensive 14 point ROS was performed, reviewed, and the pertinent orthopaedic findings are documented in the HPI.  Physical Exam: General:  Well developed, well nourished, no apparent distress, normal affect, normal gait  with no antalgic component.   HEENT: Head normocephalic, atraumatic, PERRL.   Abdomen: Soft, non tender, non distended, Bowel sounds present.  Heart: Examination of the heart reveals regular, rate, and rhythm. There is no murmur noted on ascultation. There is a normal apical pulse.  Lungs: Lungs are clear to auscultation. There is no wheeze, rhonchi, or crackles. There is normal expansion of bilateral chest walls.   Comprehensive Knee Exam: Gait Non-antalgic and fluid  Alignment Neutral   Inspection Right Left  Skin Normal appearance with no obvious deformity. No ecchymosis or erythema. Normal appearance with no obvious deformity. No ecchymosis or erythema.  Soft Tissue No focal soft tissue swelling No focal soft tissue swelling  Quad Atrophy None None   Palpation  Right Left  Tenderness Medial joint line parapatellar tenderness palpation Healed joint line parapatellar tenderness palpation  Crepitus + patellofemoral and tibiofemoral crepitus + patellofemoral and tibiofemoral crepitus  Effusion None None   Range of Motion Right Left  Flexion 0-115 5-105  Extension Full knee extension without hyperextension None correctable 5 degree flexion contracture   Ligamentous Exam Right Left  Lachman Normal Normal  Valgus 0 Normal Normal  Valgus 30 Normal Normal  Varus 0 Normal Normal  Varus 30 Normal Normal  Anterior Drawer Normal Normal  Posterior Drawer Normal Normal   Meniscal Exam Right Left  Hyperflexion Test Positive Positive  Hyperextension Test Negative Negative  McMurray's Positive Positive   Neurovascular Right Left  Quadriceps Strength 5/5 5/5  Hamstring Strength 5/5 5/5  Hip Abductor Strength 4/5 4/5  Distal Motor Normal Normal  Distal Sensory Normal light touch sensation Normal light touch sensation  Distal Pulses Normal Normal    Imaging Studies: Left knee x-rays reviewed by me today show severe tricompartmental degenerative changes with medial  bone-on-bone and patellofemoral bone-on-bone articulation with osteophyte formation sclerosis and subchondral cyst formation. Kellgren-Lawrence grade 4. There is a ossified lesion in the distal metadiaphysis of the femur centrally located consistent with an enchondroma no aggressive features noted. No fractures or dislocations noted. AP, sunrise, and flexed PA of the right knee also show similar tricompartmental generative changes with medial bone-on-bone articulation and severe patellofemoral degeneration with osteophyte formation sclerosis and cystic changes. Kellgren-Lawrence grade 4.   Assessment:  ICD-10-CM  1. Primary osteoarthritis of left knee M17.12     Plan: Tijuan is a 63 year old male with severe left knee osteoarthritis. He has complete loss of joint space in the left knee. He has had years of increasing pain with worsening limitations in his activities of daily living. Patient has had diminishing results with conservative treatment. He has seen Dr. Lorelle discussed total knee arthroplasty and agreed and consented to the procedure. Risks, benefits, complications of a left total knee arthroplasty have been discussed with patient patient has agreed and consented procedure with Dr. Lorelle 12/08/2023.  The hospitalization and post-operative care and rehabilitation were also discussed. The use of perioperative antibiotics and DVT prophylaxis were discussed. The risk, benefits and alternatives to a surgical intervention were discussed at length with the patient. The patient was also advised of risks related to the medical comorbidities and elevated body mass index (BMI). A lengthy discussion took place to review the most common complications including but not limited to: stiffness, loss of function, complex  regional pain syndrome, deep vein thrombosis, pulmonary embolus, heart attack, stroke, infection, wound breakdown, numbness, intraoperative fracture, damage to nerves, tendon,muscles,  arteries or other blood vessels, death and other possible complications from anesthesia. The patient was told that we will take steps to minimize these risks by using sterile technique, antibiotics and DVT prophylaxis when appropriate and follow the patient postoperatively in the office setting to monitor progress. The possibility of recurrent pain, no improvement in pain and actual worsening of pain were also discussed with the patient.    All questions answered patient agrees with above plan for left total knee arthroplasty.

## 2023-12-09 ENCOUNTER — Emergency Department

## 2023-12-09 ENCOUNTER — Observation Stay
Admission: EM | Admit: 2023-12-09 | Discharge: 2023-12-12 | Disposition: A | Discharge status: Home / Self Care | Attending: Internal Medicine | Admitting: Internal Medicine

## 2023-12-09 ENCOUNTER — Encounter: Payer: Self-pay | Admitting: Orthopedic Surgery

## 2023-12-09 ENCOUNTER — Other Ambulatory Visit: Payer: Self-pay

## 2023-12-09 DIAGNOSIS — Z7982 Long term (current) use of aspirin: Secondary | ICD-10-CM | POA: Insufficient documentation

## 2023-12-09 DIAGNOSIS — J449 Chronic obstructive pulmonary disease, unspecified: Secondary | ICD-10-CM | POA: Insufficient documentation

## 2023-12-09 DIAGNOSIS — M1712 Unilateral primary osteoarthritis, left knee: Principal | ICD-10-CM | POA: Insufficient documentation

## 2023-12-09 DIAGNOSIS — E669 Obesity, unspecified: Secondary | ICD-10-CM | POA: Insufficient documentation

## 2023-12-09 DIAGNOSIS — F17210 Nicotine dependence, cigarettes, uncomplicated: Secondary | ICD-10-CM | POA: Insufficient documentation

## 2023-12-09 DIAGNOSIS — R7989 Other specified abnormal findings of blood chemistry: Secondary | ICD-10-CM | POA: Insufficient documentation

## 2023-12-09 DIAGNOSIS — R55 Syncope and collapse: Principal | ICD-10-CM | POA: Insufficient documentation

## 2023-12-09 DIAGNOSIS — I214 Non-ST elevation (NSTEMI) myocardial infarction: Secondary | ICD-10-CM

## 2023-12-09 DIAGNOSIS — Z23 Encounter for immunization: Secondary | ICD-10-CM | POA: Insufficient documentation

## 2023-12-09 DIAGNOSIS — R112 Nausea with vomiting, unspecified: Secondary | ICD-10-CM

## 2023-12-09 DIAGNOSIS — Z96652 Presence of left artificial knee joint: Secondary | ICD-10-CM | POA: Insufficient documentation

## 2023-12-09 DIAGNOSIS — I2510 Atherosclerotic heart disease of native coronary artery without angina pectoris: Secondary | ICD-10-CM | POA: Insufficient documentation

## 2023-12-09 DIAGNOSIS — Z6835 Body mass index (BMI) 35.0-35.9, adult: Secondary | ICD-10-CM | POA: Insufficient documentation

## 2023-12-09 DIAGNOSIS — I5032 Chronic diastolic (congestive) heart failure: Secondary | ICD-10-CM | POA: Insufficient documentation

## 2023-12-09 DIAGNOSIS — I110 Hypertensive heart disease with heart failure: Secondary | ICD-10-CM | POA: Insufficient documentation

## 2023-12-09 LAB — BASIC METABOLIC PANEL WITH GFR
Anion gap: 9 (ref 5–15)
BUN: 25 mg/dL — ABNORMAL HIGH (ref 8–23)
CO2: 25 mmol/L (ref 22–32)
Calcium: 8.5 mg/dL — ABNORMAL LOW (ref 8.9–10.3)
Chloride: 102 mmol/L (ref 98–111)
Creatinine, Ser: 0.66 mg/dL (ref 0.61–1.24)
GFR, Estimated: >60 mL/min (ref >60)
Glucose, Bld: 99 mg/dL (ref 70–99)
Potassium: 3.7 mmol/L (ref 3.5–5.1)
Sodium: 136 mmol/L (ref 135–145)

## 2023-12-09 LAB — CBC WITH DIFFERENTIAL/PLATELET
Abs Immature Granulocytes: 0.03 K/uL (ref 0.00–0.07)
Basophils Absolute: 0 K/uL (ref 0.0–0.1)
Basophils Relative: 0 %
Eosinophils Absolute: 0 K/uL (ref 0.0–0.5)
Eosinophils Relative: 0 %
HCT: 44.5 % (ref 39.0–52.0)
Hemoglobin: 14.7 g/dL (ref 13.0–17.0)
Immature Granulocytes: 0 %
Lymphocytes Relative: 6 %
Lymphs Abs: 0.9 K/uL (ref 0.7–4.0)
MCH: 30.2 pg (ref 26.0–34.0)
MCHC: 33 g/dL (ref 30.0–36.0)
MCV: 91.6 fL (ref 80.0–100.0)
Monocytes Absolute: 1.4 K/uL — ABNORMAL HIGH (ref 0.1–1.0)
Monocytes Relative: 10 %
Neutro Abs: 11.4 K/uL — ABNORMAL HIGH (ref 1.7–7.7)
Neutrophils Relative %: 84 %
Platelets: 183 K/uL (ref 150–400)
RBC: 4.86 MIL/uL (ref 4.22–5.81)
RDW: 12.8 % (ref 11.5–15.5)
WBC: 13.7 K/uL — ABNORMAL HIGH (ref 4.0–10.5)
nRBC: 0 % (ref 0.0–0.2)

## 2023-12-09 LAB — CBC
HCT: 40.1 % (ref 39.0–52.0)
Hemoglobin: 13.5 g/dL (ref 13.0–17.0)
MCH: 30.2 pg (ref 26.0–34.0)
MCHC: 33.7 g/dL (ref 30.0–36.0)
MCV: 89.7 fL (ref 80.0–100.0)
Platelets: 205 K/uL (ref 150–400)
RBC: 4.47 MIL/uL (ref 4.22–5.81)
RDW: 12.6 % (ref 11.5–15.5)
WBC: 11.3 K/uL — ABNORMAL HIGH (ref 4.0–10.5)
nRBC: 0 % (ref 0.0–0.2)

## 2023-12-09 LAB — PROTIME-INR
INR: 1.1 (ref 0.8–1.2)
Prothrombin Time: 15 s (ref 11.4–15.2)

## 2023-12-09 MED ORDER — IOHEXOL 350 MG/ML SOLN
100.0000 mL | Freq: Once | INTRAVENOUS | Status: AC | PRN
  Administered 2023-12-10: 100 mL via INTRAVENOUS

## 2023-12-09 MED ORDER — MORPHINE SULFATE (PF) 4 MG/ML IV SOLN
4.0000 mg | Freq: Once | INTRAVENOUS | Status: AC
  Administered 2023-12-09: 4 mg via INTRAVENOUS
  Filled 2023-12-09: qty 1

## 2023-12-09 MED ORDER — ENOXAPARIN SODIUM 40 MG/0.4ML IJ SOSY
40.0000 mg | PREFILLED_SYRINGE | INTRAMUSCULAR | 0 refills | Status: AC
  Filled 2023-12-09: qty 5.6, 14d supply, fill #0

## 2023-12-09 MED ORDER — ONDANSETRON HCL 4 MG PO TABS
4.0000 mg | ORAL_TABLET | Freq: Four times a day (QID) | ORAL | 0 refills | Status: AC | PRN
  Filled 2023-12-09: qty 20, 5d supply, fill #0

## 2023-12-09 MED ORDER — ONDANSETRON HCL 4 MG/2ML IJ SOLN
4.0000 mg | Freq: Once | INTRAMUSCULAR | Status: AC
  Administered 2023-12-09: 4 mg via INTRAVENOUS
  Filled 2023-12-09: qty 2

## 2023-12-09 MED ORDER — OXYCODONE HCL 5 MG PO TABS
5.0000 mg | ORAL_TABLET | Freq: Three times a day (TID) | ORAL | 0 refills | Status: AC | PRN
  Filled 2023-12-09: qty 20, 7d supply, fill #0

## 2023-12-09 MED ORDER — DOCUSATE SODIUM 100 MG PO CAPS
100.0000 mg | ORAL_CAPSULE | Freq: Two times a day (BID) | ORAL | 0 refills | Status: AC
  Filled 2023-12-09: qty 10, 5d supply, fill #0

## 2023-12-09 MED ORDER — CELECOXIB 200 MG PO CAPS
200.0000 mg | ORAL_CAPSULE | Freq: Two times a day (BID) | ORAL | 0 refills | Status: DC
  Filled 2023-12-09: qty 28, 14d supply, fill #0

## 2023-12-09 MED ORDER — TRAMADOL HCL 50 MG PO TABS
50.0000 mg | ORAL_TABLET | Freq: Four times a day (QID) | ORAL | 0 refills | Status: AC | PRN
  Filled 2023-12-09: qty 30, 7d supply, fill #0

## 2023-12-09 MED ORDER — ACETAMINOPHEN 500 MG PO TABS
1000.0000 mg | ORAL_TABLET | Freq: Three times a day (TID) | ORAL | 0 refills | Status: AC
  Filled 2023-12-09: qty 30, 5d supply, fill #0

## 2023-12-09 NOTE — TOC Initial Note (Signed)
 Transition of Care [facility]) - Initial/Assessment Note    Patient Details  Name: Douglas Newton MRN: [id number] Date of Birth: October 25, 1960  [facility]) CM/SW Contact:    Antanasia Kaczynski L Jessah Danser, LCSW Phone Number: 12/09/2023, 9:37 AM  Clinical Narrative:                  RW and BSC ordered through [facility]. DME will be delivered to PO room.        Patient Goals and CMS Choice            Expected Discharge Plan and Services         Expected Discharge Date: 12/09/23                                    Prior Living Arrangements/Services                       Activities of Daily Living   ADL Screening (condition at time of admission) Independently performs ADLs?: Yes (appropriate for developmental age) Is the patient deaf or have difficulty hearing?: No Does the patient have difficulty seeing, even when wearing glasses/contacts?: No Does the patient have difficulty concentrating, remembering, or making decisions?: No  Permission Sought/Granted                  Emotional Assessment              Admission diagnosis:  Primary osteoarthritis of left knee [M17.12] S/P TKR (total knee replacement), left [Z96.652] Patient Active Problem List   Diagnosis Date Noted   S/P TKR (total knee replacement), left 12/08/2023   Depression 07/15/2023   Acute on chronic diastolic CHF (congestive heart failure) (HCC) 07/15/2023   Generalized weakness 07/14/2023   Symptomatic bradycardia 04/13/2019   History of colonic polyps 10/08/2016   Syncope and collapse 04/19/2015   Essential hypertension 04/19/2015   Seizure disorder (HCC) 04/19/2015   GERD (gastroesophageal reflux disease) 04/19/2015   Leukocytosis 04/19/2015   PCP:  [name], MD Pharmacy:   [facility] [address] [place] [address] Phone: [phone] Fax: [phone]     Social Drivers of Health (SDOH) Social  History: SDOH Screenings   Food Insecurity: No Food Insecurity (11/26/2023)   Received from [facility]  Housing: Low Risk  (11/26/2023)   Received from [facility]  Transportation Needs: No Transportation Needs (11/26/2023)   Received from [facility]  Utilities: Not At Risk (11/26/2023)   Received from [facility]  Financial Resource Strain: Low Risk  (11/26/2023)   Received from [facility]  Tobacco Use: High Risk (12/08/2023)   SDOH Interventions:     Readmission Risk Interventions     No data to display

## 2023-12-09 NOTE — Plan of Care (Signed)
   Problem: Coping: Goal: Ability to adjust to condition or change in health will improve Outcome: Progressing

## 2023-12-09 NOTE — TOC Transition Note (Signed)
 Transition of Care [facility]) - Discharge Note   Patient Details  Name: Douglas Newton MRN: [id number] Date of Birth: [birth year]  Transition of [facility]) CM/SW Contact:  Alvaro Louder, LCSW Phone Number: 12/09/2023, 1:22 PM   Clinical Narrative:   LCSWA confirmed with MD that patient is stable for discharge. LCSWA notified the patient and they are in agreement with discharge. LCSWA reached out to [facility] admissions coordinator and started service for patient. The patient reported that he would family come pick him up at discharge.  TOC signing off  Final next level of care: Home w Home Health Services Barriers to Discharge: No Barriers Identified   Patient Goals and CMS Choice            Discharge Placement              Patient chooses bed at:  (Home) Patient to be transferred to facility by: [facility] Name of family member notified: Self Patient and family notified of of transfer: 12/09/23  Discharge Plan and Services Additional resources added to the After Visit Summary for                            [facility] Arranged: PT, OT [facility] Agency: [facility] Date [facility] Agency Contacted: 12/09/23   Representative spoke with at [facility] Agency: Dorothe  Social Drivers of Health (SDOH) Interventions SDOH Screenings   Food Insecurity: No Food Insecurity (11/26/2023)   Received from [facility]  Housing: Low Risk  (11/26/2023)   Received [facility]  Transportation Needs: No Transportation Needs (11/26/2023)   Received from [facility]  Utilities: Not At Risk (11/26/2023)   Received from [facility]  Financial Resource Strain: Low Risk  (11/26/2023)   Received from [facility]  Tobacco Use: High Risk (12/08/2023)     Readmission Risk Interventions     No data to display

## 2023-12-09 NOTE — Progress Notes (Signed)
 DISCHARE NOTE:   Pt dc with IV removed and dc instructions given.  Pt received medication to hospital room as well as a 3 in 1 and RW. Pt has both TED hose on and in place. Pt educated on how to administer Lovenox  shot. Pt voices no further questions or concerns at this time. Pt wheeled down to medical mall entrance by staff. Pt's son provided transportation.

## 2023-12-09 NOTE — Evaluation (Signed)
 Occupational Therapy Evaluation Patient Details Name: Douglas Newton MRN: [id number] DOB: 09/13/1960 Today's Date: 12/09/2023   History of Present Illness   Pt is an 63 y.o. male s/p left total knee arthroplasty with Dr. Lorelle on 12/08/2023. PMH includes: bilat knee OA, Chickenpox, Colon polyp, Diarrhea, GERD, Hyperlipidemia, HTN, Obesity, Seizure disorder, Syncope and collapse, umbilical hernia, ventral hernia.     Clinical Impressions Patient was seen for OT evaluation this date. Prior to hospital admission, patient was living independently, able to manage ADLs/IADLs without AD, limited by L knee pain. Patient lives alone however his son will be staying with him for a couple of nights upon dc home.  Patient underwent L TKA 12/08/23. Pt instructed in polar care mgt, falls prevention strategies, home/routines modifications, DME/AE for LB bathing/dressing tasks, and compression stocking mgt. All education complete, will sign off.       If plan is discharge home, recommend the following:   A little help with bathing/dressing/bathroom     Functional Status Assessment         Equipment Recommendations   BSC/3in1     Recommendations for Other Services         Precautions/Restrictions   Precautions Precautions: Knee Precaution Booklet Issued: No Recall of Precautions/Restrictions: Intact Restrictions Weight Bearing Restrictions Per Provider Order: Yes LLE Weight Bearing Per Provider Order: Weight bearing as tolerated     Mobility Bed Mobility                    Transfers Overall transfer level: Needs assistance Equipment used: Rolling walker (2 wheels) Transfers: Sit to/from Stand Sit to Stand: Supervision                  Balance Overall balance assessment: Needs assistance Sitting-balance support: Feet supported Sitting balance-Leahy Scale: Normal     Standing balance support: No upper extremity supported, Reliant on assistive  device for balance Standing balance-Leahy Scale: Good                             ADL either performed or assessed with clinical judgement   ADL Overall ADL's : Needs assistance/impaired     Grooming: Supervision/safety;Standing   Upper Body Bathing: Supervision/ safety;Sitting   Lower Body Bathing: Minimal assistance;Sitting/lateral leans;Sit to/from stand Lower Body Bathing Details (indicate cue type and reason): would benefit from long handled loofah Upper Body Dressing : Set up   Lower Body Dressing: Moderate assistance;Sit to/from stand Lower Body Dressing Details (indicate cue type and reason): A to thread LLE Toilet Transfer: Supervision/safety;Rolling walker (2 wheels)   Toileting- Clothing Manipulation and Hygiene: Contact guard assist;Sit to/from stand               Vision         Perception         Praxis         Pertinent Vitals/Pain Pain Assessment Pain Assessment: 0-10 Pain Score: 4  Pain Location: L knee Pain Descriptors / Indicators: Sore, Aching Pain Intervention(s): Monitored during session     Extremity/Trunk Assessment Upper Extremity Assessment Upper Extremity Assessment: Overall WFL for tasks assessed       Cervical / Trunk Assessment Cervical / Trunk Assessment: Normal   Communication Communication Communication: No apparent difficulties   Cognition Arousal: Alert Behavior During Therapy: WFL for tasks assessed/performed Cognition: No apparent impairments  Following commands: Intact       Cueing  General Comments   Cueing Techniques: Verbal cues;Gestural cues;Tactile cues;Visual cues      Exercises     Shoulder Instructions      Home Living Family/patient expects to be discharged to:: (P) Private residence Living Arrangements: (P) Alone Available Help at Discharge: (P) Family Type of Home: (P) House Home Access: (P) Stairs to enter Entrance Stairs-Number  of Steps: (P) one small step to enter the home from sidewalk and then one from the porch to the house Entrance Stairs-Rails: (P) None Home Layout: (P) Two level Alternate Level Stairs-Number of Steps: (P) bedroom and bath on main level   Bathroom Shower/Tub: (P) Walk-in shower   Bathroom Toilet: (P) Handicapped height Bathroom Accessibility: (P) Yes How Accessible: (P) Accessible via walker Home Equipment: (P) None          Prior Functioning/Environment Prior Level of Function : (P) Independent/Modified Independent;Driving             Mobility Comments: (P) community ambulator ADLs Comments: (P) ind with ADLs    OT Problem List:     OT Treatment/Interventions:        OT Goals(Current goals can be found in the care plan section)       OT Frequency:       Co-evaluation              AM-PAC OT 6 Clicks Daily Activity     Outcome Measure Help from another person eating meals?: None Help from another person taking care of personal grooming?: None Help from another person toileting, which includes using toliet, bedpan, or urinal?: None Help from another person bathing (including washing, rinsing, drying)?: A Little Help from another person to put on and taking off regular upper body clothing?: None Help from another person to put on and taking off regular lower body clothing?: A Little 6 Click Score: 22   End of Session Equipment Utilized During Treatment: Rolling walker (2 wheels)  Activity Tolerance: Patient tolerated treatment well Patient left: in chair;with call bell/phone within reach  OT Visit Diagnosis: Unsteadiness on feet (R26.81)                Time: 8980-8961 OT Time Calculation (min): 19 min Charges:  OT General Charges $OT Visit: 1 Visit OT Evaluation $OT Eval Low Complexity: 1 Low OT Treatments $Self Care/Home Management : 8-22 mins  Rogers Clause, OT/L MSOT, 12/09/2023

## 2023-12-09 NOTE — ED Provider Notes (Signed)
 [facility] Provider Note    None    (approximate)   History   Loss of Consciousness  Pt BIB EMS from home with c/o LOC while watching TV. Had sudden dizziness, nausea, and lost consciousness for about 45 seconds to 1 minute. D/ced from here after total knee replacement today. Last oxy was at 1430.  146cbg  114/68 95RA   HPI Douglas Newton is a 63 y.o. male PMH multiple medical comorbidities including CHF, hypertension, seizure disorder, heart failure, COPD, hyperlipidemia recent admission for left knee replacement (discharged earlier today) presents for evaluation of a syncopal episode -Patient states he was at home earlier this evening in his recliner chair when he had a witnessed loss of consciousness episode.  Unconscious for about 45 seconds.  No associated trauma, no head strike.  Did wake up and then vomit. -Does remember feeling very dizzy and lightheaded shortly prior to episode.  No frank chest pain or shortness of breath.  Has been having notable left knee pain since discharge after his knee surgery.  He is on Lovenox . -Does have some ongoing knee pain and nausea -Does have 2 prior episodes of syncope that he says occurred when he stood up quickly     Physical Exam   Triage Vital Signs: BP (!) 107/58   Pulse 71   Temp 98.2 F (36.8 C)   Resp 19   Ht 5' 6 (1.676 m)   Wt 98.8 kg   SpO2 99%   BMI 35.16 kg/m     Most recent vital signs: Vitals:   12/09/23 2218 12/09/23 2230  BP: 119/62 (!) 107/58  Pulse: 74 71  Resp: 18 19  Temp: 98.2 F (36.8 C)   SpO2: 100% 99%     General: Awake, no distress.  CV:  Good peripheral perfusion. RRR, RP 2+ Resp:  Normal effort. CTAB Abd:  No distention. Nontender to deep palpation throughout Other:  Left lower extremity wrapped in Ace band, pedal pulse 2+, some tenderness throughout knee.  Ace bandage partially removed to look at wound wounds, dressings not removed,  clean/dry/intact.   ED Results / Procedures / Treatments   Labs (all labs ordered are listed, but only abnormal results are displayed) Labs Reviewed  CBC WITH DIFFERENTIAL/PLATELET  PROTIME-INR  LIPASE, BLOOD  COMPREHENSIVE METABOLIC PANEL WITH GFR  TROPONIN T, HIGH SENSITIVITY     EKG  Ecg = sinus rhythm, rate 72, some trace diffuse ST depressions with T wave inversions in inferior leads and leads V4-V6, left axis deviation, normal intervals..  Unchanged from prior on 07/21/2023.  No clear evidence of ischemia no arrhythmia on my interpretation.   RADIOLOGY pending    PROCEDURES:  Critical Care performed: No  Procedures   MEDICATIONS ORDERED IN ED: Medications  ondansetron  (ZOFRAN ) injection 4 mg (has no administration in time range)  morphine  (PF) 4 MG/ML injection 4 mg (has no administration in time range)     IMPRESSION / MDM / ASSESSMENT AND PLAN / ED COURSE  I reviewed the triage vital signs and the nursing notes.                              DDX/MDM/AP: Differential diagnosis includes, but is not limited to, likely vasovagal episode in the setting of knee pain and possible nausea.  Consider possibility of PE.  Patient does have known diastolic heart failure, consider transient arrhythmia.  Consider ACS though no  clear anginal equivalent.  No abdominal pain, do not suspect intra-abdominal pathology at this time.  Plan: - Labs - CT PE, dimer likely for screening tool in setting of recent surgery.  Left lower extremity DVT ultrasound. - EKG - Cardiac monitor - Pain control, antiemetic - Reassess  Patient's presentation is most consistent with acute presentation with potential threat to life or bodily function.  The patient is on the cardiac monitor to evaluate for evidence of arrhythmia and/or significant heart rate changes.  ED course below.  Patient signed out to oncoming ED provider pending labs, imaging.      FINAL CLINICAL IMPRESSION(S) / ED  DIAGNOSES   Final diagnoses:  Syncope, unspecified syncope type  Nausea and vomiting, unspecified vomiting type     Rx / DC Orders   ED Discharge Orders     None        Note:  This document was prepared using Dragon voice recognition software and may include unintentional dictation errors.   Clarine Ozell LABOR, MD 12/09/23 [phone]

## 2023-12-09 NOTE — Evaluation (Signed)
 Physical Therapy Evaluation Patient Details Name: Douglas Newton MRN: [id number] DOB: 05/08/1960 Today's Date: 12/09/2023  History of Present Illness  Pt is an 63 y.o. male s/p left total knee arthroplasty with Dr. Lorelle on 12/08/2023. PMH includes: bilat knee OA, Chickenpox, Colon polyp, Diarrhea, GERD, Hyperlipidemia, HTN, Obesity, Seizure disorder, Syncope and collapse, umbilical hernia, ventral hernia.    Clinical Impression  Pt was pleasant and motivated to participate during the session, despite some pain 5/10 on arrival, and put forth good effort throughout. Pt was educated on WBAT precautions, ROM exercises, sit to stand and stand to sit mechanics with RW, negotiating stairs and getting in and out of a vehicle. Pt tolerated approx 175 ft of ambulation with a step to pattern and CGA. Pts pain remained at 5/10 NPS during gait activity and stair training. Pt will benefit from continued PT services upon discharge to safely address deficits listed in patient problem list for decreased caregiver assistance and eventual return to PLOF.       If plan is discharge home, recommend the following: A little help with walking and/or transfers;A little help with bathing/dressing/bathroom;Assist for transportation;Help with stairs or ramp for entrance   Can travel by private vehicle        Equipment Recommendations Rolling walker (2 wheels);BSC/3in1  Recommendations for Other Services       Functional Status Assessment Patient has had a recent decline in their functional status and demonstrates the ability to make significant improvements in function in a reasonable and predictable amount of time.     Precautions / Restrictions Precautions Precautions: Knee Precaution Booklet Issued: Yes (comment) Recall of Precautions/Restrictions: Intact Restrictions Weight Bearing Restrictions Per Provider Order: Yes LLE Weight Bearing Per Provider Order: Weight bearing as tolerated       Mobility  Bed Mobility Overal bed mobility: Independent             General bed mobility comments: pt found in chair a beginning of session    Transfers Overall transfer level: Needs assistance Equipment used: Rolling walker (2 wheels) Transfers: Sit to/from Stand Sit to Stand: Contact guard assist                Ambulation/Gait Ambulation/Gait assistance: Contact guard assist Gait Distance (Feet): 175 Feet Assistive device: Rolling walker (2 wheels) Gait Pattern/deviations: Step-to pattern, Decreased stance time - left, Decreased step length - right Gait velocity: decreased   Pre-gait activities: single leg marching with L LE, R LE General Gait Details: pt needed some cueing for staying within the frame of the walker when advancing for ambulation  Stairs Stairs: Yes Stairs assistance: Contact guard assist Stair Management: Backwards, With walker Number of Stairs: 1 General stair comments: pt practiced negotiating stairs x2 w/out and LOB with CGA  Wheelchair Mobility     Tilt Bed    Modified Rankin (Stroke Patients Only)       Balance Overall balance assessment: Needs assistance Sitting-balance support: Feet supported Sitting balance-Leahy Scale: Normal     Standing balance support: No upper extremity supported, Reliant on assistive device for balance Standing balance-Leahy Scale: Good Standing balance comment: tolerated standing with CGA                             Pertinent Vitals/Pain Pain Assessment Pain Assessment: 0-10 Pain Score: 5  Pain Location: L knee Pain Descriptors / Indicators: Sore, Aching Pain Intervention(s): Monitored during session, Repositioned    Home Living  Family/patient expects to be discharged to:: Private residence Living Arrangements: Alone Available Help at Discharge: Family (son is able to help once dc'd) Type of Home: House Home Access: Stairs to enter 1+1 Entrance Stairs-Rails: None Entrance  Stairs-Number of Steps: one small step to enter the home from sidewalk Alternate Level Stairs-Number of Steps: bedroom and bath on main level Home Layout: Two level Home Equipment: None      Prior Function Prior Level of Function : Independent/Modified Independent;Driving             Mobility Comments: community ambulator ADLs Comments: ind with ADLs     Extremity/Trunk Assessment             Cervical / Trunk Assessment Cervical / Trunk Assessment: Normal  Communication   Communication Communication: No apparent difficulties    Cognition Arousal: Alert Behavior During Therapy: WFL for tasks assessed/performed   PT - Cognitive impairments: No apparent impairments                         Following commands: Intact       Cueing Cueing Techniques: Verbal cues, Gestural cues, Tactile cues, Visual cues     General Comments      Exercises Total Joint Exercises Quad Sets: AROM, Left, 10 reps, Seated (tolerated with no increase in pain) Knee Flexion: AROM, Left, 5 reps, Seated Goniometric ROM: 0 -75 deg, L knee AROM Marching in Standing: Left, AROM, 5 reps  Educated on positioning to promote L knee PROM   Assessment/Plan    PT Assessment Patient needs continued PT services  PT Problem List Decreased strength;Decreased range of motion;Decreased activity tolerance;Decreased balance;Decreased mobility;Decreased knowledge of use of DME;Decreased knowledge of precautions;Pain       PT Treatment Interventions DME instruction;Gait training;Stair training;Functional mobility training;Therapeutic activities;Therapeutic exercise;Balance training;Patient/family education    PT Goals (Current goals can be found in the Care Plan section)  Acute Rehab PT Goals Patient Stated Goal: walking without a limp PT Goal Formulation: With patient Time For Goal Achievement: 12/22/23 Potential to Achieve Goals: Good    Frequency BID     Co-evaluation                AM-PAC PT 6 Clicks Mobility  Outcome Measure Help needed turning from your back to your side while in a flat bed without using bedrails?: None Help needed moving from lying on your back to sitting on the side of a flat bed without using bedrails?: None Help needed moving to and from a bed to a chair (including a wheelchair)?: None Help needed standing up from a chair using your arms (e.g., wheelchair or bedside chair)?: None Help needed to walk in hospital room?: A Little Help needed climbing 3-5 steps with a railing? : A Little 6 Click Score: 22    End of Session Equipment Utilized During Treatment: Gait belt Activity Tolerance: Patient tolerated treatment well;No increased pain Patient left: in chair;with call bell/phone within reach;with chair alarm set Nurse Communication: Mobility status PT Visit Diagnosis: Other abnormalities of gait and mobility (R26.89);Unsteadiness on feet (R26.81);Pain Pain - Right/Left: Left Pain - part of body: Knee    Time: 9147-9067 PT Time Calculation (min) (ACUTE ONLY): 40 min   Charges:                 Corean Newport, SPT 12/09/23, 11:56 AM

## 2023-12-09 NOTE — ED Provider Notes (Signed)
 Care of this patient assumed from prior physician at 2300 pending  labs, ultrasound, CTA. Please see prior physician note for further details.  Briefly this is a 63 year old male with recent knee replacement presenting with syncope.  Labs, DVT lower extremity, and CT of the chest pending.  EKG demonstrate sinus rhythm at a rate of 72, PR 160, QRS 98, QTc 434, nonspecific ST changes noted.  Labs with leukocytosis WC of 13.7, CMP without significant derangement.  Normal INR and lipase. Initial troponin elevated at 391, downtrend to 353 on repeat.  Ultrasound of the leg without evidence of DVT.  CTA of the chest without evidence of PE.  Patient reassessed and updated results of workup.  Denies recurrent lightheadedness.  Does report that after his initial syncopal episode was noted to be nauseous, dizzy, weak.  With that history I am concerned about ACS.  Ordered for aspirin  and heparin .  Patient is agreeable with admission.  Will reach out to hospitalist team.  Case discussed with hospitalist team. They will evaluate for anticipated admission.      Levander Slate, MD 12/10/23 [phone]
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
## 2023-12-09 NOTE — ED Triage Notes (Signed)
 Pt BIB EMS from home with c/o LOC while watching TV. Had sudden dizziness, nausea, and lost consciousness for about 45 seconds to 1 minute. D/ced from here after total knee replacement today. Last oxy was at 1430.  146cbg  114/68 95RA

## 2023-12-09 NOTE — Plan of Care (Signed)
  Problem: Education: Goal: Ability to describe self-care measures that may prevent or decrease complications (Diabetes Survival Skills Education) will improve Outcome: Progressing   Problem: Coping: Goal: Ability to adjust to condition or change in health will improve Outcome: Progressing   Problem: Health Behavior/Discharge Planning: Goal: Ability to identify and utilize available resources and services will improve Outcome: Progressing   Problem: Metabolic: Goal: Ability to maintain appropriate glucose levels will improve Outcome: Progressing   Problem: Nutritional: Goal: Maintenance of adequate nutrition will improve Outcome: Progressing   Problem: Tissue Perfusion: Goal: Adequacy of tissue perfusion will improve Outcome: Progressing

## 2023-12-10 ENCOUNTER — Observation Stay
Admit: 2023-12-10 | Discharge: 2023-12-10 | Disposition: A | Discharge status: Home / Self Care | Attending: Internal Medicine | Admitting: Internal Medicine

## 2023-12-10 ENCOUNTER — Observation Stay

## 2023-12-10 DIAGNOSIS — R55 Syncope and collapse: Secondary | ICD-10-CM

## 2023-12-10 LAB — COMPREHENSIVE METABOLIC PANEL WITH GFR
ALT: 18 U/L (ref 0–44)
AST: 73 U/L — ABNORMAL HIGH (ref 15–41)
Albumin: 3.6 g/dL (ref 3.5–5.0)
Alkaline Phosphatase: 112 U/L (ref 38–126)
Anion gap: 10 (ref 5–15)
BUN: 21 mg/dL (ref 8–23)
CO2: 25 mmol/L (ref 22–32)
Calcium: 8.8 mg/dL — ABNORMAL LOW (ref 8.9–10.3)
Chloride: 102 mmol/L (ref 98–111)
Creatinine, Ser: 0.83 mg/dL (ref 0.61–1.24)
GFR, Estimated: >60 mL/min (ref >60)
Glucose, Bld: 107 mg/dL — ABNORMAL HIGH (ref 70–99)
Potassium: 4 mmol/L (ref 3.5–5.1)
Sodium: 137 mmol/L (ref 135–145)
Total Bilirubin: 1.1 mg/dL (ref 0.0–1.2)
Total Protein: 6.3 g/dL — ABNORMAL LOW (ref 6.5–8.1)

## 2023-12-10 LAB — CK: Total CK: 213 U/L (ref 49–397)

## 2023-12-10 LAB — LIPASE, BLOOD: Lipase: 17 U/L (ref 11–51)

## 2023-12-10 LAB — APTT: aPTT: 26 s (ref 24–36)

## 2023-12-10 LAB — TROPONIN T, HIGH SENSITIVITY
Troponin T High Sensitivity: 391 ng/L (ref 0–19)
Troponin T High Sensitivity: 353 ng/L (ref 0–19)

## 2023-12-10 LAB — HEPARIN LEVEL (UNFRACTIONATED): Heparin Unfractionated: <0.1 [IU]/mL — ABNORMAL LOW (ref 0.30–0.70)

## 2023-12-10 MED ORDER — PERFLUTREN LIPID MICROSPHERE
1.0000 mL | INTRAVENOUS | Status: AC | PRN
  Administered 2023-12-10: 2 mL via INTRAVENOUS

## 2023-12-10 MED ORDER — ASPIRIN 325 MG PO TABS
325.0000 mg | ORAL_TABLET | Freq: Once | ORAL | Status: AC
  Administered 2023-12-10: 325 mg via ORAL
  Filled 2023-12-10: qty 1

## 2023-12-10 MED ORDER — CARBAMAZEPINE 200 MG PO TABS
200.0000 mg | ORAL_TABLET | Freq: Four times a day (QID) | ORAL | Status: DC
  Administered 2023-12-10 – 2023-12-12 (×10): 200 mg via ORAL
  Filled 2023-12-10 (×11): qty 1

## 2023-12-10 MED ORDER — ALBUTEROL SULFATE HFA 108 (90 BASE) MCG/ACT IN AERS: 2.0000 | INHALATION_SPRAY | Freq: Four times a day (QID) | RESPIRATORY_TRACT | Status: DC | PRN

## 2023-12-10 MED ORDER — HEPARIN BOLUS VIA INFUSION
2500.0000 [IU] | Freq: Once | INTRAVENOUS | Status: DC
  Filled 2023-12-10: qty 2500

## 2023-12-10 MED ORDER — ASPIRIN 300 MG RE SUPP: 300.0000 mg | RECTAL | Status: DC

## 2023-12-10 MED ORDER — ASPIRIN 81 MG PO TBEC
81.0000 mg | DELAYED_RELEASE_TABLET | Freq: Every day | ORAL | Status: DC
  Administered 2023-12-11 – 2023-12-12 (×2): 81 mg via ORAL
  Filled 2023-12-10 (×2): qty 1

## 2023-12-10 MED ORDER — FLUTICASONE PROPIONATE 50 MCG/ACT NA SUSP
2.0000 | Freq: Every morning | NASAL | Status: DC
  Administered 2023-12-10 – 2023-12-12 (×3): 2 via NASAL
  Filled 2023-12-10: qty 16

## 2023-12-10 MED ORDER — ONDANSETRON HCL 4 MG/2ML IJ SOLN: 4.0000 mg | Freq: Four times a day (QID) | INTRAMUSCULAR | Status: DC | PRN

## 2023-12-10 MED ORDER — ALBUTEROL SULFATE (2.5 MG/3ML) 0.083% IN NEBU: 2.5000 mg | INHALATION_SOLUTION | Freq: Four times a day (QID) | RESPIRATORY_TRACT | Status: DC | PRN

## 2023-12-10 MED ORDER — NITROGLYCERIN 0.4 MG SL SUBL: 0.4000 mg | SUBLINGUAL_TABLET | SUBLINGUAL | Status: DC | PRN

## 2023-12-10 MED ORDER — FLUTICASONE FUROATE-VILANTEROL 200-25 MCG/ACT IN AEPB
1.0000 | INHALATION_SPRAY | Freq: Every day | RESPIRATORY_TRACT | Status: DC
  Administered 2023-12-10 – 2023-12-12 (×3): 1 via RESPIRATORY_TRACT
  Filled 2023-12-10: qty 28

## 2023-12-10 MED ORDER — DAPAGLIFLOZIN PROPANEDIOL 10 MG PO TABS
10.0000 mg | ORAL_TABLET | Freq: Every day | ORAL | Status: DC
  Administered 2023-12-10 – 2023-12-12 (×3): 10 mg via ORAL
  Filled 2023-12-10 (×3): qty 1

## 2023-12-10 MED ORDER — SACUBITRIL-VALSARTAN 49-51 MG PO TABS
1.0000 | ORAL_TABLET | Freq: Two times a day (BID) | ORAL | Status: DC
  Administered 2023-12-10 – 2023-12-11 (×3): 1 via ORAL
  Filled 2023-12-10 (×4): qty 1

## 2023-12-10 MED ORDER — DOCUSATE SODIUM 100 MG PO CAPS
100.0000 mg | ORAL_CAPSULE | Freq: Two times a day (BID) | ORAL | Status: DC
  Administered 2023-12-10 – 2023-12-11 (×3): 100 mg via ORAL
  Filled 2023-12-10 (×3): qty 1

## 2023-12-10 MED ORDER — POTASSIUM CHLORIDE ER 10 MEQ PO TBCR
20.0000 meq | EXTENDED_RELEASE_TABLET | Freq: Every day | ORAL | Status: DC
  Administered 2023-12-10: 20 meq via ORAL
  Filled 2023-12-10 (×2): qty 2

## 2023-12-10 MED ORDER — ASPIRIN 81 MG PO CHEW: 324.0000 mg | CHEWABLE_TABLET | ORAL | Status: DC

## 2023-12-10 MED ORDER — HEPARIN BOLUS VIA INFUSION
4000.0000 [IU] | Freq: Once | INTRAVENOUS | Status: AC
  Administered 2023-12-10: 4000 [IU] via INTRAVENOUS
  Filled 2023-12-10: qty 4000

## 2023-12-10 MED ORDER — OXYCODONE HCL 5 MG PO TABS
5.0000 mg | ORAL_TABLET | ORAL | Status: DC | PRN
  Administered 2023-12-10 – 2023-12-12 (×10): 5 mg via ORAL
  Filled 2023-12-10 (×10): qty 1

## 2023-12-10 MED ORDER — CARVEDILOL 12.5 MG PO TABS
12.5000 mg | ORAL_TABLET | Freq: Two times a day (BID) | ORAL | Status: DC
  Administered 2023-12-10 – 2023-12-12 (×4): 12.5 mg via ORAL
  Filled 2023-12-10 (×2): qty 1
  Filled 2023-12-10: qty 2
  Filled 2023-12-10: qty 1

## 2023-12-10 MED ORDER — OXYCODONE HCL 5 MG PO TABS
5.0000 mg | ORAL_TABLET | Freq: Once | ORAL | Status: AC
Start: 2023-12-10 — End: 2023-12-10
  Administered 2023-12-10: 5 mg via ORAL
  Filled 2023-12-10: qty 1

## 2023-12-10 MED ORDER — MORPHINE SULFATE (PF) 2 MG/ML IV SOLN
2.0000 mg | INTRAVENOUS | Status: DC | PRN
  Administered 2023-12-10: 2 mg via INTRAVENOUS
  Filled 2023-12-10: qty 1

## 2023-12-10 MED ORDER — FUROSEMIDE 40 MG PO TABS
40.0000 mg | ORAL_TABLET | Freq: Every evening | ORAL | Status: DC
  Administered 2023-12-10 – 2023-12-11 (×2): 40 mg via ORAL
  Filled 2023-12-10 (×2): qty 1

## 2023-12-10 MED ORDER — ACETAMINOPHEN 325 MG PO TABS
650.0000 mg | ORAL_TABLET | ORAL | Status: DC | PRN
  Administered 2023-12-10 – 2023-12-12 (×5): 650 mg via ORAL
  Filled 2023-12-10 (×5): qty 2

## 2023-12-10 MED ORDER — HEPARIN (PORCINE) 25000 UT/250ML-% IV SOLN
1400.0000 [IU]/h | INTRAVENOUS | Status: DC
  Administered 2023-12-10: 1150 [IU]/h via INTRAVENOUS
  Filled 2023-12-10: qty 250

## 2023-12-10 MED ORDER — ENOXAPARIN SODIUM 60 MG/0.6ML IJ SOSY
0.5000 mg/kg | PREFILLED_SYRINGE | INTRAMUSCULAR | Status: DC
  Administered 2023-12-10 – 2023-12-11 (×2): 50 mg via SUBCUTANEOUS
  Filled 2023-12-10 (×2): qty 0.6
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
## 2023-12-10 NOTE — ED Notes (Addendum)
 This RN refilled Pt ice container as requested.
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
## 2023-12-10 NOTE — ED Notes (Signed)
 X-ray at bedside.

## 2023-12-10 NOTE — Evaluation (Addendum)
 Physical Therapy Evaluation Patient Details Name: Douglas Newton MRN: [id number] DOB: [birth year] Today's Date: 12/10/2023 Note submitted 12/11/23, for services rendered 12/10/23  History of Present Illness  Pt is an 63 y.o. male s/p left total knee arthroplasty with Dr. Lorelle on 12/08/2023. PMH includes: bilat knee OA, Chickenpox, Colon polyp, Diarrhea, GERD, Hyperlipidemia, HTN, Obesity, Seizure disorder, Syncope and collapse, umbilical hernia, ventral hernia. Pt returning to ED on 12/10/23 after syncopal episode and collapse.   Clinical Impression  Pt was pleasant and motivated to participate during the session, despite some pain 7/10 on arrival, and put forth good effort throughout. Pt was able to complete 1 set x10 quad sets, ankle pumps, and AAROM SLRs at the beginning of the session with no increase in pain. Pt needed min A for transitioning from supine to sit EOB, with cueing to extend and push through UE. Pt was able to tolerate standing balance, and marching in place with RW and CGA. Pt needed additional cueing for using RW for UE support. Pt was able to ambulate approx 100 ft using RW; no overt loss of balance and pain/RPE around 7/10 consistently. Pts SpO2 was monitored before, during and post transitional movements/activity with SpO2 at 97% and HR in the low 100's with activity. Pt will benefit from continued PT services upon discharge to safely address deficits listed in patient problem list for decreased caregiver assistance and eventual return to PLOF.     If plan is discharge home, recommend the following: A little help with walking and/or transfers;A little help with bathing/dressing/bathroom;Assist for transportation;Help with stairs or ramp for entrance   Can travel by private vehicle        Equipment Recommendations    Recommendations for Other Services       Functional Status Assessment       Precautions / Restrictions Precautions Precautions: Knee Precaution  Booklet Issued: No Recall of Precautions/Restrictions: Intact Restrictions Weight Bearing Restrictions Per Provider Order: Yes LLE Weight Bearing Per Provider Order: Weight bearing as tolerated      Mobility  Bed Mobility Overal bed mobility: Needs Assistance Bed Mobility: Supine to Sit     Supine to sit: Min assist     General bed mobility comments: pt needed min A moving LE to EOB    Transfers Overall transfer level: Needs assistance Equipment used: Rolling walker (2 wheels) Transfers: Sit to/from Stand Sit to Stand: Contact guard assist                Ambulation/Gait Ambulation/Gait assistance: Contact guard assist Gait Distance (Feet): 100 Feet Assistive device: Rolling walker (2 wheels) Gait Pattern/deviations: Step-to pattern, Decreased stance time - left, Decreased step length - right Gait velocity: decreased   Pre-gait activities: single leg marching and alternating General Gait Details: pt needed some cueing for staying within the frame of the walker when advancing for ambulation  Stairs            Wheelchair Mobility     Tilt Bed    Modified Rankin (Stroke Patients Only)       Balance Overall balance assessment: Needs assistance Sitting-balance support: Feet supported Sitting balance-Leahy Scale: Good Sitting balance - Comments: required CGA for safety and support Postural control:  (needed cueing for UE placement and supportive sitting) Standing balance support: Single extremity supported Standing balance-Leahy Scale: Good Standing balance comment: tolerated standing with CGA, needed cueing to extend L LE  Pertinent Vitals/Pain Pain Assessment Pain Assessment: 0-10 Pain Score: 7  Pain Location: L knee Pain Descriptors / Indicators: Sore, Aching Pain Intervention(s): Monitored during session, Repositioned, RN gave pain meds during session, Ice applied, Limited activity within patient's  tolerance    Home Living Family/patient expects to be discharged to:: Private residence Living Arrangements: Alone (son will be staying with him initially) Available Help at Discharge: Family Type of Home: House Home Access: Stairs to enter Entrance Stairs-Rails: None Entrance Stairs-Number of Steps: one small step to enter the home from sidewalk Alternate Level Stairs-Number of Steps: bedroom and bath on main level Home Layout: Two level Home Equipment: None      Prior Function Prior Level of Function : Independent/Modified Independent;Driving             Mobility Comments: community ambulator ADLs Comments: ind with ADLs     Extremity/Trunk Assessment             Cervical / Trunk Assessment Cervical / Trunk Assessment: Normal  Communication   Communication Communication: No apparent difficulties    Cognition Arousal: Alert Behavior During Therapy: WFL for tasks assessed/performed   PT - Cognitive impairments: No apparent impairments                         Following commands: Intact       Cueing Cueing Techniques: Verbal cues, Gestural cues, Tactile cues, Visual cues     General Comments General comments (skin integrity, edema, etc.): RPE 7/10 during ambulation with RW. Pain 7/10 throughout    Exercises Total Joint Exercises Ankle Circles/Pumps: AROM, Left, 10 reps, Supine Quad Sets: AROM, Left, 10 reps, Supine Straight Leg Raises: AAROM, Left, Supine (attempted twice and educated on benefits) Marching in Standing: Left, AROM, 5 reps   Assessment/Plan    PT Assessment Patient needs continued PT services  PT Problem List Decreased strength;Decreased range of motion;Decreased activity tolerance;Decreased balance;Decreased mobility;Decreased knowledge of use of DME;Decreased knowledge of precautions;Pain       PT Treatment Interventions DME instruction;Gait training;Stair training;Functional mobility training;Therapeutic  activities;Therapeutic exercise;Balance training;Patient/family education    PT Goals (Current goals can be found in the Care Plan section)  Acute Rehab PT Goals Patient Stated Goal: walking without a limp PT Goal Formulation: With patient Time For Goal Achievement: 12/22/23 Potential to Achieve Goals: Good    Frequency 7X/week     Co-evaluation               AM-PAC PT 6 Clicks Mobility  Outcome Measure Help needed turning from your back to your side while in a flat bed without using bedrails?: A Little Help needed moving from lying on your back to sitting on the side of a flat bed without using bedrails?: A Little Help needed moving to and from a bed to a chair (including a wheelchair)?: A Little Help needed standing up from a chair using your arms (e.g., wheelchair or bedside chair)?: A Little Help needed to walk in hospital room?: A Little Help needed climbing 3-5 steps with a railing? : A Little 6 Click Score: 18    End of Session Equipment Utilized During Treatment: Gait belt Activity Tolerance: Patient tolerated treatment well;Patient limited by pain Patient left: with call bell/phone within reach;in chair Nurse Communication: Mobility status PT Visit Diagnosis: Other abnormalities of gait and mobility (R26.89);Unsteadiness on feet (R26.81);Pain Pain - Right/Left: Left Pain - part of body: Knee    Time: 8480-8444 PT Time Calculation (  min) (ACUTE ONLY): 36 min   Charges:               This entire session was performed under direct supervision and direction of a licensed therapist/therapist assistant. I have personally read, edited and approve of the note as written.  Carmin Deed, DPT  Corean Newport, SPT 12/11/23, 9:08 AM

## 2023-12-10 NOTE — ED Notes (Signed)
 Fall risk bundle in place.

## 2023-12-10 NOTE — TOC Progression Note (Signed)
 Transition of Care [facility]) - Progression Note    Patient Details  Name: Douglas Newton MRN: [id number] Date of Birth: 1960-11-08  Transition of [facility]) CM/SW Contact  Rayden Scheper L Luz Burcher, [place] Phone Number: 12/10/2023, 8:32 AM  Clinical Narrative:      Home Health services set up with [facility].                    Expected Discharge Plan and Services                                               Social Drivers of Health (SDOH) Interventions SDOH Screenings   Food Insecurity: No Food Insecurity (11/26/2023)   Received from [facility]  Housing: Low Risk  (11/26/2023)   Received from [facility]  Transportation Needs: No Transportation Needs (11/26/2023)   Received from [facility]  Utilities: Not At Risk (11/26/2023)   Received from [facility]  Financial Resource Strain: Low Risk  (11/26/2023)   Received from [facility]  Tobacco Use: High Risk (12/09/2023)    Readmission Risk Interventions     No data to display

## 2023-12-10 NOTE — ED Notes (Signed)
 Echo set up at bedside

## 2023-12-10 NOTE — ED Notes (Signed)
 PT at bedside.

## 2023-12-10 NOTE — ED Notes (Signed)
 Assisted pt in getting out of the bed to use the urnial

## 2023-12-10 NOTE — ED Notes (Signed)
 Lunch tray given.
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
## 2023-12-10 NOTE — ED Notes (Signed)
Pt ambulated to bathroom with walker without difficulty.

## 2023-12-10 NOTE — ED Notes (Signed)
Pt in chair at this time.

## 2023-12-10 NOTE — ED Notes (Signed)
 Pt requesting medication for pain in left Knee.
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
## 2023-12-11 ENCOUNTER — Other Ambulatory Visit: Payer: Self-pay

## 2023-12-11 DIAGNOSIS — R55 Syncope and collapse: Secondary | ICD-10-CM | POA: Diagnosis not present

## 2023-12-11 LAB — BASIC METABOLIC PANEL WITH GFR
Anion gap: 11 (ref 5–15)
BUN: 16 mg/dL (ref 8–23)
CO2: 25 mmol/L (ref 22–32)
Calcium: 8.4 mg/dL — ABNORMAL LOW (ref 8.9–10.3)
Chloride: 101 mmol/L (ref 98–111)
Creatinine, Ser: 0.83 mg/dL (ref 0.61–1.24)
GFR, Estimated: >60 mL/min (ref >60)
Glucose, Bld: 101 mg/dL — ABNORMAL HIGH (ref 70–99)
Potassium: 4 mmol/L (ref 3.5–5.1)
Sodium: 137 mmol/L (ref 135–145)

## 2023-12-11 LAB — ECHOCARDIOGRAM COMPLETE
AV Mean grad: 26 mmHg
AV Peak grad: 47.9 mmHg
Ao pk vel: 3.46 m/s
Area-P 1/2: 4.39 cm2
Calc EF: 68.5 %
Height: 66 in
S' Lateral: 1.65 cm
Single Plane A2C EF: 76.4 %
Single Plane A4C EF: 55.5 %
Weight: 3485.03 [oz_av]

## 2023-12-11 LAB — CBC
HCT: 39.4 % (ref 39.0–52.0)
Hemoglobin: 12.7 g/dL — ABNORMAL LOW (ref 13.0–17.0)
MCH: 29.5 pg (ref 26.0–34.0)
MCHC: 32.2 g/dL (ref 30.0–36.0)
MCV: 91.6 fL (ref 80.0–100.0)
Platelets: 182 K/uL (ref 150–400)
RBC: 4.3 MIL/uL (ref 4.22–5.81)
RDW: 12.8 % (ref 11.5–15.5)
WBC: 11.5 K/uL — ABNORMAL HIGH (ref 4.0–10.5)
nRBC: 0 % (ref 0.0–0.2)

## 2023-12-11 LAB — LIPOPROTEIN A (LPA): Lipoprotein (a): 264.8 nmol/L — ABNORMAL HIGH (ref <75.0)

## 2023-12-11 MED ORDER — DOCUSATE SODIUM 100 MG PO CAPS
200.0000 mg | ORAL_CAPSULE | Freq: Two times a day (BID) | ORAL | Status: DC
  Administered 2023-12-11 – 2023-12-12 (×3): 200 mg via ORAL
  Filled 2023-12-11 (×3): qty 2

## 2023-12-11 MED ORDER — PNEUMOCOCCAL 20-VAL CONJ VACC 0.5 ML IM SUSY
0.5000 mL | PREFILLED_SYRINGE | INTRAMUSCULAR | Status: AC
  Administered 2023-12-12: 0.5 mL via INTRAMUSCULAR
  Filled 2023-12-11: qty 0.5

## 2023-12-11 MED ORDER — POLYETHYLENE GLYCOL 3350 17 G PO PACK
17.0000 g | PACK | Freq: Every day | ORAL | Status: DC
  Administered 2023-12-11 – 2023-12-12 (×2): 17 g via ORAL
  Filled 2023-12-11 (×2): qty 1

## 2023-12-11 MED ORDER — SACUBITRIL-VALSARTAN 24-26 MG PO TABS
1.0000 | ORAL_TABLET | Freq: Two times a day (BID) | ORAL | Status: DC
  Administered 2023-12-11 – 2023-12-12 (×2): 1 via ORAL
  Filled 2023-12-11 (×2): qty 1

## 2023-12-11 MED ORDER — POTASSIUM CHLORIDE CRYS ER 20 MEQ PO TBCR
20.0000 meq | EXTENDED_RELEASE_TABLET | Freq: Every day | ORAL | Status: DC
  Administered 2023-12-11 – 2023-12-12 (×2): 20 meq via ORAL
  Filled 2023-12-11 (×2): qty 1
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
## 2023-12-11 NOTE — Progress Notes (Signed)
 Physical Therapy Treatment Patient Details Name: Douglas Newton MRN: [id number] DOB: 04-19-1960 Today's Date: 12/11/2023   History of Present Illness 63 y.o. male s/p left total knee arthroplasty with Dr. Lorelle on 12/08/2023. PMH includes: bilat knee OA, Chickenpox, Colon polyp, Diarrhea, GERD, Hyperlipidemia, HTN, Obesity, Seizure disorder, Syncope and collapse, umbilical hernia, ventral hernia. Pt returning to ED on 12/10/23 after syncopal episode and collapse.    PT Comments  Pt pleasant and willing to work with PT.  He did c/o expected pain t/o the session but was able to stay on task with gentle cuing and encouragement.  Pt was able to ambulate >100 ft but was highly reliant on the walker and did have inconsistency with cadence/steppage that did improve with consistent cuing.  Pt will benefit from ongoing PT, continue with POC.    If plan is discharge home, recommend the following: A little help with walking and/or transfers;A little help with bathing/dressing/bathroom;Assist for transportation;Help with stairs or ramp for entrance   Can travel by private vehicle        Equipment Recommendations  Rolling walker (2 wheels);BSC/3in1    Recommendations for Other Services       Precautions / Restrictions Precautions Precautions: Knee;Fall Restrictions Weight Bearing Restrictions Per Provider Order: Yes LLE Weight Bearing Per Provider Order: Weight bearing as tolerated     Mobility  Bed Mobility               General bed mobility comments: in recliner pre/post session    Transfers Overall transfer level: Needs assistance Equipment used: Rolling walker (2 wheels) Transfers: Sit to/from Stand Sit to Stand: Contact guard assist           General transfer comment: cuing for set up and UE use, slow but did not need assist    Ambulation/Gait Ambulation/Gait assistance: Contact guard assist Gait Distance (Feet): 135 Feet Assistive device: Rolling walker (2  wheels)   Gait velocity: decreased     General Gait Details: Pt initially highly reliant on walker, did show improved cadence and efficiency with cuing for step-through pattern and deliberate L quad engagement   Stairs             Wheelchair Mobility     Tilt Bed    Modified Rankin (Stroke Patients Only)       Balance Overall balance assessment: Needs assistance Sitting-balance support: Feet supported Sitting balance-Leahy Scale: Good     Standing balance support: Bilateral upper extremity supported Standing balance-Leahy Scale: Fair Standing balance comment: cuing to keep L knee extended, UE reliant                            Communication Communication Communication: No apparent difficulties  Cognition Arousal: Alert Behavior During Therapy: WFL for tasks assessed/performed   PT - Cognitive impairments: No apparent impairments                         Following commands: Intact      Cueing Cueing Techniques: Verbal cues, Gestural cues, Tactile cues, Visual cues  Exercises Total Joint Exercises Ankle Circles/Pumps: AROM, Left, 10 reps, Supine Quad Sets: Strengthening, 10 reps (popliteal pressure) Heel Slides: AAROM, 10 reps (with resisted leg ext) Hip ABduction/ADduction: AROM, Strengthening, 10 reps Straight Leg Raises: AAROM (unable to AROM SLR on L) Goniometric ROM: 4-81    General Comments General comments (skin integrity, edema, etc.): Pt ostensibly eager to do  all he can with PT but is pain limited with some ROM and general tasks      Pertinent Vitals/Pain Pain Assessment Pain Assessment: 0-10 Pain Score: 6  Pain Location: L knee    Home Living                          Prior Function            PT Goals (current goals can now be found in the care plan section) Progress towards PT goals: Progressing toward goals    Frequency    7X/week      PT Plan      Co-evaluation               AM-PAC PT 6 Clicks Mobility   Outcome Measure  Help needed turning from your back to your side while in a flat bed without using bedrails?: A Little Help needed moving from lying on your back to sitting on the side of a flat bed without using bedrails?: A Little Help needed moving to and from a bed to a chair (including a wheelchair)?: A Little Help needed standing up from a chair using your arms (e.g., wheelchair or bedside chair)?: A Little Help needed to walk in hospital room?: A Little Help needed climbing 3-5 steps with a railing? : A Little 6 Click Score: 18    End of Session Equipment Utilized During Treatment: Gait belt Activity Tolerance: Patient tolerated treatment well;Patient limited by pain Patient left: with call bell/phone within reach;in chair Nurse Communication: Mobility status PT Visit Diagnosis: Other abnormalities of gait and mobility (R26.89);Unsteadiness on feet (R26.81);Pain Pain - Right/Left: Left Pain - part of body: Knee     Time: 9096-9068 PT Time Calculation (min) (ACUTE ONLY): 28 min  Charges:    $Gait Training: 8-22 mins $Therapeutic Exercise: 8-22 mins PT General Charges $$ ACUTE PT VISIT: 1 Visit                     Carmin JONELLE Deed, DPT 12/11/2023, 1:11 PM

## 2023-12-11 NOTE — Plan of Care (Signed)

## 2023-12-11 NOTE — Progress Notes (Addendum)
 PROGRESS NOTE    Douglas Newton  FMW:[id number] DOB: 01/31/1960 DOA: 12/09/2023 PCP: [name], MD    Assessment & Plan:   Principal Problem:   Syncope and collapse  Assessment and Plan:  Vasovagal syncope: likely secondary hypotension, possibly orthostatic hypotension. Reduced dose of entresto  this evening. Echo shows EF 70-75%, no regional wall motion abnormalities, grade I diastolic dysfunction. Pt's BP has been lower since having surg as per pt. Will d/c home w/ cardiac monitor as per cardio.    Elevated troponins: likely secondary to demand ischemia. No further cardiac work-up as per pt.    HTN: continue on home dose of coreg , lasix  & reduced dose of enteresto as BP has been running lower  Chronic diastolic CHF: appears compensated. Continue on coreg , lasix  and reduced dose of entresto .  Monitor I/Os  Left knee arthroplasty: XR was neg. PT recs HH   Obesity: BMI 35.1. Would benefit from weight loss   DVT prophylaxis: lovenox   Code Status: full  Family Communication:  Disposition Plan: likely d/c home tomorrow  Status is: Observation The patient remains OBS appropriate and will d/c before 2 midnights.    Level of care: Progressive Consultants:  Cardio   Procedures:   Antimicrobials:    Subjective: Pt c/o malaise   Objective: Vitals:   12/11/23 0820 12/11/23 1136 12/11/23 1247 12/11/23 1526  BP: 124/64 (!) 92/59 (!) 100/56 100/62  Pulse: 89 83 82 84  Resp:  16  18  Temp:  98.5 F (36.9 C)  98.1 F (36.7 C)  TempSrc:      SpO2: 97% 97%  100%  Weight:      Height:        Intake/Output Summary (Last 24 hours) at 12/11/2023 1609 Last data filed at 12/11/2023 1412 Gross per 24 hour  Intake 600 ml  Output 400 ml  Net 200 ml   Filed Weights   12/09/23 2219  Weight: 98.8 kg    Examination:  General exam: Appears calm and comfortable  Respiratory system: Clear to auscultation. Respiratory effort normal. Cardiovascular system: S1 &  S2+. No rubs, gallops or clicks.  Gastrointestinal system: Abdomen is obese, soft and nontender.  Normal bowel sounds heard. Central nervous system: Alert and oriented.  Psychiatry: Judgement and insight appear normal. Appropriate mood and affect    Data Reviewed: I have personally reviewed following labs and imaging studies  CBC: Recent Labs  Lab 12/09/23 0533 12/09/23 2320 12/11/23 0342  WBC 11.3* 13.7* 11.5*  NEUTROABS  --  11.4*  --   HGB 13.5 14.7 12.7*  HCT 40.1 44.5 39.4  MCV 89.7 91.6 91.6  PLT 205 183 182   Basic Metabolic Panel: Recent Labs  Lab 12/09/23 0533 12/09/23 2330 12/11/23 0342  NA 136 137 137  K 3.7 4.0 4.0  CL 102 102 101  CO2 25 25 25   GLUCOSE 99 107* 101*  BUN 25* 21 16  CREATININE 0.66 0.83 0.83  CALCIUM 8.5* 8.8* 8.4*   GFR: Estimated Creatinine Clearance: 101.5 mL/min (by C-G formula based on SCr of 0.83 mg/dL). Liver Function Tests: Recent Labs  Lab 12/09/23 2330  AST 73*  ALT 18  ALKPHOS 112  BILITOT 1.1  PROT 6.3*  ALBUMIN 3.6   Recent Labs  Lab 12/09/23 2320  LIPASE 17   No results for input(s): AMMONIA in the last 168 hours. Coagulation Profile: Recent Labs  Lab 12/09/23 2320  INR 1.1   Cardiac Enzymes: Recent Labs  Lab 12/10/23 0110  CKTOTAL 213   BNP (last 3 results) No results for input(s): PROBNP in the last 8760 hours. HbA1C: No results for input(s): HGBA1C in the last 72 hours. CBG: No results for input(s): GLUCAP in the last 168 hours. Lipid Profile: No results for input(s): CHOL, HDL, LDLCALC, TRIG, CHOLHDL, LDLDIRECT in the last 72 hours. Thyroid Function Tests: No results for input(s): TSH, T4TOTAL, FREET4, T3FREE, THYROIDAB in the last 72 hours. Anemia Panel: No results for input(s): VITAMINB12, FOLATE, FERRITIN, TIBC, IRON, RETICCTPCT in the last 72 hours. Sepsis Labs: No results for input(s): PROCALCITON, LATICACIDVEN in the last 168 hours.  No  results found for this or any previous visit (from the past 240 hours).       Radiology Studies: ECHOCARDIOGRAM COMPLETE Result Date: 12/11/2023    ECHOCARDIOGRAM REPORT   Patient Name:   Douglas Newton Date of Exam: 12/10/2023 Medical Rec #:  [id number]          Height:       66.0 in Accession #:    [id number]         Weight:       217.8 lb Date of Birth:  1960/08/29          BSA:          2.074 m Patient Age:    63 years           BP:           110/67 mmHg Patient Gender: M                  HR:           89 bpm. Exam Location:  [facility] Procedure: 2D Echo, Color Doppler, Cardiac Doppler and Intracardiac            Opacification Agent (Both Spectral and Color Flow Doppler were            utilized during procedure). Indications:     Syncope R55  History:         Patient has prior history of Echocardiogram examinations, most                  recent 07/15/2023. Hypertrophic Cardiomyopathy;                  Signs/Symptoms:Syncope.  Sonographer:     Ashley McNeely-Sloane Referring Phys:  [id number] DELAYNE LULLA SOLIAN Diagnosing Phys: Marsa Dooms MD IMPRESSIONS  1. Left ventricular ejection fraction, by estimation, is 70 to 75%. The left ventricle has hyperdynamic function. The left ventricle has no regional wall motion abnormalities. Left ventricular diastolic parameters are consistent with Grade I diastolic dysfunction (impaired relaxation).  2. Right ventricular systolic function is normal. The right ventricular size is normal.  3. The mitral valve is normal in structure. Trivial mitral valve regurgitation. No evidence of mitral stenosis.  4. The aortic valve is normal in structure. Aortic valve regurgitation is not visualized. No aortic stenosis is present.  5. The inferior vena cava is normal in size with greater than 50% respiratory variability, suggesting right atrial pressure of 3 mmHg. FINDINGS  Left Ventricle: Left ventricular ejection fraction, by estimation, is 70 to 75%. The left ventricle has  hyperdynamic function. The left ventricle has no regional wall motion abnormalities. Definity contrast agent was given IV to delineate the left ventricular endocardial borders. Strain was performed and the global longitudinal strain is indeterminate. The left ventricular internal cavity size was normal in size. There is no left  ventricular hypertrophy. Left ventricular diastolic parameters are consistent with Grade I diastolic dysfunction (impaired relaxation). Right Ventricle: The right ventricular size is normal. No increase in right ventricular wall thickness. Right ventricular systolic function is normal. Left Atrium: Left atrial size was normal in size. Right Atrium: Right atrial size was normal in size. Pericardium: There is no evidence of pericardial effusion. Mitral Valve: The mitral valve is normal in structure. Trivial mitral valve regurgitation. No evidence of mitral valve stenosis. MV peak gradient, 13.5 mmHg. The mean mitral valve gradient is 6.0 mmHg. Tricuspid Valve: The tricuspid valve is normal in structure. Tricuspid valve regurgitation is trivial. No evidence of tricuspid stenosis. Aortic Valve: The aortic valve is normal in structure. Aortic valve regurgitation is not visualized. No aortic stenosis is present. Aortic valve mean gradient measures 26.0 mmHg. Aortic valve peak gradient measures 47.9 mmHg. Pulmonic Valve: The pulmonic valve was normal in structure. Pulmonic valve regurgitation is not visualized. No evidence of pulmonic stenosis. Aorta: The aortic root is normal in size and structure. Venous: The inferior vena cava is normal in size with greater than 50% respiratory variability, suggesting right atrial pressure of 3 mmHg. IAS/Shunts: No atrial level shunt detected by color flow Doppler. Additional Comments: 3D was performed not requiring image post processing on an independent workstation and was indeterminate.  LEFT VENTRICLE PLAX 2D LVIDd:         3.75 cm      Diastology LVIDs:          1.65 cm      LV e' medial:    4.51 cm/s LV PW:         2.25 cm      LV E/e' medial:  18.9 LV IVS:        1.95 cm      LV e' lateral:   8.24 cm/s LVOT diam:     2.00 cm      LV E/e' lateral: 10.3 LVOT Area:     3.14 cm  LV Volumes (MOD) LV vol d, MOD A2C: 85.1 ml LV vol d, MOD A4C: 101.0 ml LV vol s, MOD A2C: 20.1 ml LV vol s, MOD A4C: 44.9 ml LV SV MOD A2C:     65.0 ml LV SV MOD A4C:     101.0 ml LV SV MOD BP:      68.5 ml RIGHT VENTRICLE RV Basal diam:  5.30 cm RV Mid diam:    3.90 cm RV S prime:     22.40 cm/s TAPSE (M-mode): 1.7 cm LEFT ATRIUM             Index        RIGHT ATRIUM           Index LA diam:        3.50 cm 1.69 cm/m   RA Area:     19.40 cm LA Vol (A2C):   72.5 ml 34.96 ml/m  RA Volume:   46.70 ml  22.52 ml/m LA Vol (A4C):   31.1 ml 15.00 ml/m LA Biplane Vol: 51.8 ml 24.98 ml/m  AORTIC VALVE AV Vmax:      346.00 cm/s AV Vmean:     234.000 cm/s AV VTI:       0.591 m AV Peak Grad: 47.9 mmHg AV Mean Grad: 26.0 mmHg  AORTA Ao Root diam: 3.30 cm Ao Asc diam:  3.00 cm MITRAL VALVE MV Area (PHT): 4.39 cm     SHUNTS MV Peak grad:  13.5 mmHg  Systemic Diam: 2.00 cm MV Mean grad:  6.0 mmHg MV Vmax:       1.84 m/s MV Vmean:      124.0 cm/s MV Decel Time: 173 msec MV E velocity: 85.10 cm/s MV A velocity: 153.00 cm/s MV E/A ratio:  0.56 Marsa Dooms MD Electronically signed by Marsa Dooms MD Signature Date/Time: 12/11/2023/9:58:46 AM    Final    DG Knee 1-2 Views Left Result Date: 12/10/2023 CLINICAL DATA:  Left knee pain. EXAM: LEFT KNEE - 1-2 VIEW COMPARISON:  12/08/2023 FINDINGS: Evidence of patient's total left knee arthroplasty with prosthetic components intact and unchanged. No evidence of fracture or dislocation. Remainder of the exam is unchanged. IMPRESSION: 1. No acute findings. 2. Left knee arthroplasty intact. Electronically Signed   By: Toribio Agreste M.D.   On: 12/10/2023 11:28   CT Angio Chest PE W and/or Wo Contrast Result Date: 12/10/2023 EXAM: CTA of the Chest  with contrast for PE 12/10/2023 12:49:30 AM TECHNIQUE: CTA of the chest was performed after the administration of intravenous contrast. Multiplanar reformatted images are provided for review. MIP images are provided for review. Automated exposure control, iterative reconstruction, and/or weight based adjustment of the mA/kV was utilized to reduce the radiation dose to as low as reasonably achievable. COMPARISON: 07/14/2023 CLINICAL HISTORY: Syncope, surgery earlier today. FINDINGS: PULMONARY ARTERIES: The pulmonary artery shows a normal branching pattern bilaterally. No intraluminal filling defect is seen. Main pulmonary artery is normal in caliber. MEDIASTINUM: The heart is enlarged in size. The aorta and its branches are within normal limits. No aneurysmal dilatation or dissection is seen. The esophagus is within normal limits. LYMPH NODES: No mediastinal, hilar or axillary lymphadenopathy. LUNGS AND PLEURA: Calcified granuloma is noted in the right lung base, stable from the prior study. No focal infiltrate or sizable effusion is seen. No pneumothorax. UPPER ABDOMEN: Limited images of the upper abdomen are unremarkable. SOFT TISSUES AND BONES: No acute bone or soft tissue abnormality. IMPRESSION: 1. No pulmonary embolism. 2. Cardiomegaly. Electronically signed by: Oneil Devonshire MD 12/10/2023 12:53 AM EST RP Workstation: [id number]   US  Venous Img Lower Unilateral Left (DVT) Result Date: 12/10/2023 EXAM: ULTRASOUND DUPLEX OF THE LEFT LOWER EXTREMITY VEINS TECHNIQUE: Duplex ultrasound using B-mode/gray scaled imaging and Doppler spectral analysis and color flow was obtained of the deep venous structures of the left lower extremity. COMPARISON: None available. CLINICAL HISTORY: [id number] Left leg pain [id number] FINDINGS: The common femoral vein, femoral vein, popliteal vein, and posterior tibial vein of the left lower extremity demonstrate normal compressibility with normal color flow and spectral analysis. IMPRESSION:  1. No evidence of DVT in the left lower extremity . Electronically signed by: Oneil Devonshire MD 12/10/2023 12:18 AM EST RP Workstation: [id number]        Scheduled Meds:  aspirin  EC  81 mg Oral Daily   carbamazepine   200 mg Oral QID   carvedilol   12.5 mg Oral BID WC   dapagliflozin  propanediol  10 mg Oral Daily   docusate sodium  200 mg Oral BID   enoxaparin  (LOVENOX ) injection  0.5 mg/kg Subcutaneous Q24H   fluticasone   2 spray Each Nare q AM   fluticasone  furoate-vilanterol  1 puff Inhalation Daily   furosemide   40 mg Oral QPM   [START ON 12/12/2023] pneumococcal 20-valent conjugate vaccine  0.5 mL Intramuscular Tomorrow-1000   polyethylene glycol  17 g Oral Daily   potassium chloride  SA  20 mEq Oral Daily   sacubitril -valsartan   1 tablet Oral BID  Continuous Infusions:   LOS: 0 days       Anthony CHRISTELLA Pouch, MD [facility] Pager [phone]  If 7PM-7AM, please contact night-coverage [email or web address] 12/11/2023, 4:09 PM

## 2023-12-12 DIAGNOSIS — R55 Syncope and collapse: Secondary | ICD-10-CM | POA: Diagnosis not present

## 2023-12-12 LAB — BASIC METABOLIC PANEL WITH GFR
Anion gap: 10 (ref 5–15)
BUN: 26 mg/dL — ABNORMAL HIGH (ref 8–23)
CO2: 26 mmol/L (ref 22–32)
Calcium: 8.2 mg/dL — ABNORMAL LOW (ref 8.9–10.3)
Chloride: 101 mmol/L (ref 98–111)
Creatinine, Ser: 0.89 mg/dL (ref 0.61–1.24)
GFR, Estimated: >60 mL/min (ref >60)
Glucose, Bld: 98 mg/dL (ref 70–99)
Potassium: 3.9 mmol/L (ref 3.5–5.1)
Sodium: 136 mmol/L (ref 135–145)

## 2023-12-12 LAB — CBC
HCT: 35.3 % — ABNORMAL LOW (ref 39.0–52.0)
Hemoglobin: 11.7 g/dL — ABNORMAL LOW (ref 13.0–17.0)
MCH: 30.3 pg (ref 26.0–34.0)
MCHC: 33.1 g/dL (ref 30.0–36.0)
MCV: 91.5 fL (ref 80.0–100.0)
Platelets: 189 K/uL (ref 150–400)
RBC: 3.86 MIL/uL — ABNORMAL LOW (ref 4.22–5.81)
RDW: 12.6 % (ref 11.5–15.5)
WBC: 8.3 K/uL (ref 4.0–10.5)
nRBC: 0 % (ref 0.0–0.2)

## 2023-12-12 MED ORDER — SACUBITRIL-VALSARTAN 24-26 MG PO TABS: 1.0000 | ORAL_TABLET | Freq: Two times a day (BID) | ORAL | 0 refills | Status: AC

## 2023-12-12 MED ORDER — ASPIRIN 81 MG PO TBEC: 81.0000 mg | DELAYED_RELEASE_TABLET | Freq: Every day | ORAL | 0 refills | Status: AC

## 2023-12-12 NOTE — TOC Transition Note (Signed)
 Transition of Care [facility]) - Discharge Note   Patient Details  Name: Douglas Newton MRN: [id number] Date of Birth: 10-07-1960  Transition of [facility]) CM/SW Contact:  Victory Jackquline RAMAN, RN Phone Number: 12/12/2023, 11:55 AM   Clinical Narrative:   RNCM received a message via secure chat from the MD stating that the patient is being discharged and she ordered HH/PT/OT for him. RNCM review the chart. Called patient at [phone]. RNCM Introduced role and explained that discharge planning would be discussed. PT is recommending RW and BSC, [name] states that he already has a RW and BSC. Pt is set up with [facility] for PT/OT. Pt has discharge orders, no further concerns. RNCM signing off.   Final next level of care: Home w Home Health Services Barriers to Discharge: Barriers Resolved   Patient Goals and CMS Choice            Discharge Placement                Patient to be transferred to facility by: Son Name of family member notified: Niels Patient and family notified of of transfer: 12/12/23  Discharge Plan and Services Additional resources added to the After Visit Summary for                                       Social Drivers of Health (SDOH) Interventions SDOH Screenings   Food Insecurity: No Food Insecurity (12/11/2023)  Housing: Low Risk  (12/11/2023)  Transportation Needs: No Transportation Needs (12/11/2023)  Utilities: Not At Risk (12/11/2023)  Financial Resource Strain: Low Risk  (11/26/2023)   Received from [facility]  Tobacco Use: High Risk (12/09/2023)     Readmission Risk Interventions     No data to display

## 2023-12-12 NOTE — Plan of Care (Signed)

## 2023-12-12 NOTE — Progress Notes (Signed)
 Physical Therapy Treatment Patient Details Name: Douglas Newton MRN: [id number] DOB: 31-Oct-1960 Today's Date: 12/12/2023   History of Present Illness 64 y.o. male s/p left total knee arthroplasty with Dr. Lorelle on 12/08/2023. PMH includes: bilat knee OA, Chickenpox, Colon polyp, Diarrhea, GERD, Hyperlipidemia, HTN, Obesity, Seizure disorder, Syncope and collapse, umbilical hernia, ventral hernia. Pt returning to ED on 12/10/23 after syncopal episode and collapse.    PT Comments  Pt is A and O x 4. C/O pain but cooperative and motivated. Continues to have decreased AROM LLE. He was encouraged to continue to focus efforts on improving ROM and strength throughout the day. Pt was able to tolerated getting OOB, standing to RW, and ambulating ~ 150 ft. No LOB or c/o dizziness. BP remains stable throughout session. Pt was educated on symptom management and importance of continued routine mobility at DC. Pt is planning to DC home later this date. HHPT recommended at DC to maximize his independence and safety with all ADLs.    If plan is discharge home, recommend the following: A little help with walking and/or transfers;A little help with bathing/dressing/bathroom;Assist for transportation;Help with stairs or ramp for entrance     Equipment Recommendations  Rolling walker (2 wheels);BSC/3in1       Precautions / Restrictions Precautions Precautions: Knee;Fall Recall of Precautions/Restrictions: Intact Restrictions Weight Bearing Restrictions Per Provider Order: Yes LLE Weight Bearing Per Provider Order: Weight bearing as tolerated     Mobility  Bed Mobility Overal bed mobility: Needs Assistance Bed Mobility: Supine to Sit, Sit to Supine  Supine to sit: Min assist Sit to supine: Min assist General bed mobility comments: min assist to exit bed and also to return to bed after OOB activity.    Transfers Overall transfer level: Needs assistance Equipment used: Rolling walker (2  wheels) Transfers: Sit to/from Stand Sit to Stand: Supervision   Ambulation/Gait Ambulation/Gait assistance: Contact guard assist, Supervision Gait Distance (Feet): 150 Feet Assistive device: Rolling walker (2 wheels) Gait Pattern/deviations: Step-to pattern, Decreased stance time - left, Decreased step length - right Gait velocity: decreased  General Gait Details: Pt initially highly reliant on walker, did show improved cadence and efficiency with cuing for step-through pattern and deliberate L quad engagement   Stairs  General stair comments: pt correctly stated proper sequencing for stair performance to simulate home environment.    Balance Overall balance assessment: Modified Independent Sitting-balance support: Feet supported Sitting balance-Leahy Scale: Good     Standing balance support: Bilateral upper extremity supported, During functional activity, Reliant on assistive device for balance Standing balance-Leahy Scale: Fair Standing balance comment: reliant on BUE support for dynamic standing activity       Communication Communication Communication: No apparent difficulties  Cognition Arousal: Alert Behavior During Therapy: WFL for tasks assessed/performed   PT - Cognitive impairments: No apparent impairments      Following commands: Intact      Cueing Cueing Techniques: Verbal cues, Gestural cues  Exercises Total Joint Exercises Goniometric ROM: 3-78        Pertinent Vitals/Pain Pain Assessment Pain Assessment: 0-10 Pain Score: 6  Pain Location: L knee Pain Descriptors / Indicators: Sore, Aching Pain Intervention(s): Limited activity within patient's tolerance, Monitored during session, Premedicated before session, Repositioned, Ice applied     PT Goals (current goals can now be found in the care plan section) Acute Rehab PT Goals Patient Stated Goal: walking without a limp Progress towards PT goals: Progressing toward goals    Frequency  7X/week       AM-PAC PT 6 Clicks Mobility   Outcome Measure  Help needed turning from your back to your side while in a flat bed without using bedrails?: A Little Help needed moving from lying on your back to sitting on the side of a flat bed without using bedrails?: A Little Help needed moving to and from a bed to a chair (including a wheelchair)?: A Little Help needed standing up from a chair using your arms (e.g., wheelchair or bedside chair)?: A Little Help needed to walk in hospital room?: A Little Help needed climbing 3-5 steps with a railing? : A Little 6 Click Score: 18    End of Session Equipment Utilized During Treatment: Gait belt Activity Tolerance: Patient tolerated treatment well;Patient limited by pain Patient left: in bed;with call bell/phone within reach;with bed alarm set Nurse Communication: Mobility status PT Visit Diagnosis: Other abnormalities of gait and mobility (R26.89);Unsteadiness on feet (R26.81);Pain Pain - Right/Left: Left Pain - part of body: Knee     Time: 8897-8862 PT Time Calculation (min) (ACUTE ONLY): 35 min  Charges:    $Gait Training: 8-22 mins $Therapeutic Exercise: 8-22 mins PT General Charges $$ ACUTE PT VISIT: 1 Visit                    Rankin Essex PTA 12/12/23, 12:46 PM

## 2023-12-12 NOTE — Discharge Summary (Signed)
 Physician Discharge Summary  [name]:[id number] DOB: 08-05-1960 DOA: 12/09/2023  PCP: [name], MD  Admit date: 12/09/2023 Discharge date: 12/12/2023  Admitted From:home  Disposition: home w/ home health   Recommendations for Outpatient Follow-up:  Follow up with PCP in 1-2 weeks F/u w/ cardio, Dr. Ammon, in 1-2 weeks   Home Health: yes Equipment/Devices: cardiac monitor   Discharge Condition: stable CODE STATUS: full Diet recommendation: Heart Healthy   Brief/Interim Summary: HPI was taken from [name]:  Reyes LITTIE Breeding is a 63 y.o. male with medical history significant of chronic HFpEF, COPD, nonobstructive CAD, HTN, HLD, presented with syncope.   Patient was just hospitalized this week for elective left knee arthroplasty secondary to severe OA, the surgery went well and patient discharged home yesterday.  Patient was able to walk short distance without help and was able to negotiate stairs without problems.  Yesterday afternoon patient was lying on couch when his doorbell went off.  He rose up from the couch too fast and put weight on the left knee was sudden, he felt 10/10 excruciating pain of the left knee and fell back on the chair.  Son heard a noise and came to look at him and found patient less responsive sweating and breathing fast.  Symptoms lasted about 45-60 seconds and patient recovered consciousness but started to feel nauseous and vomited 3 times of stomach content.  And son called EMS.  Patient denies any chest pain shortness of breath.  No loss control urine and bowel movements during the episode.   ED Course: Afebrile, blood pressure 120/70 O2 saturation 98% room air.  Blood work showed WBC 11.3 hemoglobin 13.5 BUN 25 creatinine 0.6.  CTA negative for PE, DVT study negative.  Troponin level 391> 335.  EKG showed LVH and chronic ST changes on inferior leads and lateral leads.  Discharge Diagnoses:  Principal Problem:   Syncope and  collapse  Vasovagal syncope: likely secondary hypotension, possibly orthostatic hypotension. Reduced dose of entresto  as per cardio. Echo shows EF 70-75%, no regional wall motion abnormalities, grade I diastolic dysfunction. Pt's BP has been lower since having surg as per pt. Will d/c home w/ cardiac monitor as per cardio.    Elevated troponins: likely secondary to demand ischemia. No further cardiac work-up as per pt.    HTN: continue on home dose of coreg , lasix  & reduced dose of enteresto as BP has been running lower  Chronic diastolic CHF: appears compensated. Continue on coreg , lasix  and reduced dose of entresto .  Monitor I/Os  Left knee arthroplasty: XR was neg. PT recs HH   Obesity: BMI 35.1. Would benefit from weight loss  Discharge Instructions  Discharge Instructions     Diet - low sodium heart healthy   Complete by: As directed    Discharge instructions   Complete by: As directed    F/u w/ PCP in 1-2 weeks. F/u w/ cardio, Dr. Ammon, in 1-2 weeks   Increase activity slowly   Complete by: As directed    No wound care   Complete by: As directed       Allergies as of 12/12/2023   No Known Allergies      Medication List     STOP taking these medications    celecoxib  200 MG capsule Commonly known as: CeleBREX    diclofenac Sodium 1 % Gel Commonly known as: VOLTAREN   Entresto  49-51 MG Generic drug: sacubitril -valsartan  Replaced by: sacubitril -valsartan  24-26 MG  TAKE these medications    Acetaminophen  Extra Strength 500 MG Tabs Take 2 tablets (1,000 mg total) by mouth every 8 (eight) hours.   albuterol  108 (90 Base) MCG/ACT inhaler Commonly known as: VENTOLIN  HFA Inhale 2 puffs into the lungs every 6 (six) hours as needed for shortness of breath or wheezing.   aspirin  EC 81 MG tablet Take 1 tablet (81 mg total) by mouth daily. Swallow whole. Start taking on: December 13, 2023   carbamazepine  200 MG tablet Commonly known as:  TEGRETOL  Take 200 mg by mouth 4 (four) times daily.   carvedilol  12.5 MG tablet Commonly known as: COREG  Take 12.5 mg by mouth 2 (two) times daily with a meal.   dapagliflozin  propanediol 10 MG Tabs tablet Commonly known as: FARXIGA  Take 1 tablet (10 mg total) by mouth daily.   docusate sodium 100 MG capsule Commonly known as: COLACE Take 1 capsule (100 mg total) by mouth 2 (two) times daily.   enoxaparin  40 MG/0.4ML injection Commonly known as: LOVENOX  Inject 0.4 mLs (40 mg total) into the skin daily for 14 days.   FINASTERIDE-MINOXIDIL  EX Apply 1 spray topically daily. (6% minoxidil -0.3% finasteride)Applied to scalp once daily   fluticasone  50 MCG/ACT nasal spray Commonly known as: FLONASE  Place 2 sprays into both nostrils in the morning.   fluticasone  furoate-vilanterol 200-25 MCG/ACT Aepb Commonly known as: BREO ELLIPTA  Inhale 1 puff into the lungs daily.   furosemide  40 MG tablet Commonly known as: LASIX  Take 1 tablet (40 mg total) by mouth daily. What changed: when to take this   minoxidil  2 % external solution Commonly known as: ROGAINE  Apply 1 spray topically daily.   ondansetron  4 MG tablet Commonly known as: ZOFRAN  Take 1 tablet (4 mg total) by mouth every 6 (six) hours as needed for nausea.   oxyCODONE  5 MG immediate release tablet Commonly known as: Roxicodone  Take 1 tablet (5 mg total) by mouth every 8 (eight) hours as needed for breakthrough pain.   potassium chloride  10 MEQ tablet Commonly known as: KLOR-CON  M Take 2 tablets (20 mEq) by mouth today, then take 1 tablet (10 mEq) daily until surgery.  Be sure to take dose on day of surgery.  Follow-up with PCP for repeat labs.   sacubitril -valsartan  24-26 MG Commonly known as: ENTRESTO  Take 1 tablet by mouth 2 (two) times daily. Replaces: Entresto  49-51 MG   spironolactone  25 MG tablet Commonly known as: ALDACTONE  Take 1 tablet (25 mg total) by mouth daily. What changed: when to take this    traMADol 50 MG tablet Commonly known as: ULTRAM Take 1 tablet (50 mg total) by mouth every 6 (six) hours as needed for moderate pain (pain score 4-6).        Contact information for follow-up providers     Paraschos, Alexander, MD. Go in 1 week(s).   Specialty: Cardiology Contact information: [address] [facility] [place] [address] [phone]              Contact information for after-discharge care     Home Medical Care     [facility]) .   Service: Home Health Services Contact information: [address] [place]  [phone]                    No Known Allergies  Consultations: Cardio    Procedures/Studies: ECHOCARDIOGRAM COMPLETE Result Date: 12/11/2023    ECHOCARDIOGRAM REPORT   Patient Name:  Douglas Newton Date of Exam: 12/10/2023 Medical Rec #:  [id number]          Height:       66.0 in Accession #:    [id number]         Weight:       217.8 lb Date of Birth:  1960/10/02          BSA:          2.074 m Patient Age:    63 years           BP:           110/67 mmHg Patient Gender: M                  HR:           89 bpm. Exam Location:  [facility] Procedure: 2D Echo, Color Doppler, Cardiac Doppler and Intracardiac            Opacification Agent (Both Spectral and Color Flow Doppler were            utilized during procedure). Indications:     Syncope R55  History:         Patient has prior history of Echocardiogram examinations, most                  recent 07/15/2023. Hypertrophic Cardiomyopathy;                  Signs/Symptoms:Syncope.  Sonographer:     Ashley McNeely-Sloane Referring Phys:  [id number] DELAYNE LULLA SOLIAN Diagnosing Phys: Marsa Dooms MD IMPRESSIONS  1. Left ventricular ejection fraction, by estimation, is 70 to 75%. The left ventricle has hyperdynamic function. The left ventricle has no regional wall motion abnormalities. Left ventricular diastolic parameters are  consistent with Grade I diastolic dysfunction (impaired relaxation).  2. Right ventricular systolic function is normal. The right ventricular size is normal.  3. The mitral valve is normal in structure. Trivial mitral valve regurgitation. No evidence of mitral stenosis.  4. The aortic valve is normal in structure. Aortic valve regurgitation is not visualized. No aortic stenosis is present.  5. The inferior vena cava is normal in size with greater than 50% respiratory variability, suggesting right atrial pressure of 3 mmHg. FINDINGS  Left Ventricle: Left ventricular ejection fraction, by estimation, is 70 to 75%. The left ventricle has hyperdynamic function. The left ventricle has no regional wall motion abnormalities. Definity contrast agent was given IV to delineate the left ventricular endocardial borders. Strain was performed and the global longitudinal strain is indeterminate. The left ventricular internal cavity size was normal in size. There is no left ventricular hypertrophy. Left ventricular diastolic parameters are consistent with Grade I diastolic dysfunction (impaired relaxation). Right Ventricle: The right ventricular size is normal. No increase in right ventricular wall thickness. Right ventricular systolic function is normal. Left Atrium: Left atrial size was normal in size. Right Atrium: Right atrial size was normal in size. Pericardium: There is no evidence of pericardial effusion. Mitral Valve: The mitral valve is normal in structure. Trivial mitral valve regurgitation. No evidence of mitral valve stenosis. MV peak gradient, 13.5 mmHg. The mean mitral valve gradient is 6.0 mmHg. Tricuspid Valve: The tricuspid valve is normal in structure. Tricuspid valve regurgitation is trivial. No evidence of tricuspid stenosis. Aortic Valve: The aortic valve is normal in structure. Aortic valve regurgitation is not visualized. No aortic stenosis is present. Aortic valve mean gradient measures 26.0 mmHg. Aortic  valve peak  gradient measures 47.9 mmHg. Pulmonic Valve: The pulmonic valve was normal in structure. Pulmonic valve regurgitation is not visualized. No evidence of pulmonic stenosis. Aorta: The aortic root is normal in size and structure. Venous: The inferior vena cava is normal in size with greater than 50% respiratory variability, suggesting right atrial pressure of 3 mmHg. IAS/Shunts: No atrial level shunt detected by color flow Doppler. Additional Comments: 3D was performed not requiring image post processing on an independent workstation and was indeterminate.  LEFT VENTRICLE PLAX 2D LVIDd:         3.75 cm      Diastology LVIDs:         1.65 cm      LV e' medial:    4.51 cm/s LV PW:         2.25 cm      LV E/e' medial:  18.9 LV IVS:        1.95 cm      LV e' lateral:   8.24 cm/s LVOT diam:     2.00 cm      LV E/e' lateral: 10.3 LVOT Area:     3.14 cm  LV Volumes (MOD) LV vol d, MOD A2C: 85.1 ml LV vol d, MOD A4C: 101.0 ml LV vol s, MOD A2C: 20.1 ml LV vol s, MOD A4C: 44.9 ml LV SV MOD A2C:     65.0 ml LV SV MOD A4C:     101.0 ml LV SV MOD BP:      68.5 ml RIGHT VENTRICLE RV Basal diam:  5.30 cm RV Mid diam:    3.90 cm RV S prime:     22.40 cm/s TAPSE (M-mode): 1.7 cm LEFT ATRIUM             Index        RIGHT ATRIUM           Index LA diam:        3.50 cm 1.69 cm/m   RA Area:     19.40 cm LA Vol (A2C):   72.5 ml 34.96 ml/m  RA Volume:   46.70 ml  22.52 ml/m LA Vol (A4C):   31.1 ml 15.00 ml/m LA Biplane Vol: 51.8 ml 24.98 ml/m  AORTIC VALVE AV Vmax:      346.00 cm/s AV Vmean:     234.000 cm/s AV VTI:       0.591 m AV Peak Grad: 47.9 mmHg AV Mean Grad: 26.0 mmHg  AORTA Ao Root diam: 3.30 cm Ao Asc diam:  3.00 cm MITRAL VALVE MV Area (PHT): 4.39 cm     SHUNTS MV Peak grad:  13.5 mmHg    Systemic Diam: 2.00 cm MV Mean grad:  6.0 mmHg MV Vmax:       1.84 m/s MV Vmean:      124.0 cm/s MV Decel Time: 173 msec MV E velocity: 85.10 cm/s MV A velocity: 153.00 cm/s MV E/A ratio:  0.56 Marsa Dooms MD  Electronically signed by Marsa Dooms MD Signature Date/Time: 12/11/2023/9:58:46 AM    Final    DG Knee 1-2 Views Left Result Date: 12/10/2023 CLINICAL DATA:  Left knee pain. EXAM: LEFT KNEE - 1-2 VIEW COMPARISON:  12/08/2023 FINDINGS: Evidence of patient's total left knee arthroplasty with prosthetic components intact and unchanged. No evidence of fracture or dislocation. Remainder of the exam is unchanged. IMPRESSION: 1. No acute findings. 2. Left knee arthroplasty intact. Electronically Signed   By: Toribio Agreste M.D.   On: 12/10/2023  11:28   CT Angio Chest PE W and/or Wo Contrast Result Date: 12/10/2023 EXAM: CTA of the Chest with contrast for PE 12/10/2023 12:49:30 AM TECHNIQUE: CTA of the chest was performed after the administration of intravenous contrast. Multiplanar reformatted images are provided for review. MIP images are provided for review. Automated exposure control, iterative reconstruction, and/or weight based adjustment of the mA/kV was utilized to reduce the radiation dose to as low as reasonably achievable. COMPARISON: 07/14/2023 CLINICAL HISTORY: Syncope, surgery earlier today. FINDINGS: PULMONARY ARTERIES: The pulmonary artery shows a normal branching pattern bilaterally. No intraluminal filling defect is seen. Main pulmonary artery is normal in caliber. MEDIASTINUM: The heart is enlarged in size. The aorta and its branches are within normal limits. No aneurysmal dilatation or dissection is seen. The esophagus is within normal limits. LYMPH NODES: No mediastinal, hilar or axillary lymphadenopathy. LUNGS AND PLEURA: Calcified granuloma is noted in the right lung base, stable from the prior study. No focal infiltrate or sizable effusion is seen. No pneumothorax. UPPER ABDOMEN: Limited images of the upper abdomen are unremarkable. SOFT TISSUES AND BONES: No acute bone or soft tissue abnormality. IMPRESSION: 1. No pulmonary embolism. 2. Cardiomegaly. Electronically signed by: Oneil Devonshire MD 12/10/2023 12:53 AM EST RP Workstation: [id number]   US  Venous Img Lower Unilateral Left (DVT) Result Date: 12/10/2023 EXAM: ULTRASOUND DUPLEX OF THE LEFT LOWER EXTREMITY VEINS TECHNIQUE: Duplex ultrasound using B-mode/gray scaled imaging and Doppler spectral analysis and color flow was obtained of the deep venous structures of the left lower extremity. COMPARISON: None available. CLINICAL HISTORY: 221812 Left leg pain [id number] FINDINGS: The common femoral vein, femoral vein, popliteal vein, and posterior tibial vein of the left lower extremity demonstrate normal compressibility with normal color flow and spectral analysis. IMPRESSION: 1. No evidence of DVT in the left lower extremity . Electronically signed by: Oneil Devonshire MD 12/10/2023 12:18 AM EST RP Workstation: [id number]   DG Knee 1-2 Views Left Result Date: 12/08/2023 CLINICAL DATA:  Elective surgery.  Intraop/postop. EXAM: LEFT KNEE - 1-2 VIEW COMPARISON:  None Available. FINDINGS: Left knee arthroplasty in expected alignment. No periprosthetic lucency or fracture. Recent postsurgical change includes air and edema in the soft tissues and joint space. Bull intra-articular bodies posteriorly. IMPRESSION: Left knee arthroplasty without immediate postoperative complication. Electronically Signed   By: Andrea Gasman M.D.   On: 12/08/2023 16:53   (Echo, Carotid, EGD, Colonoscopy, ERCP)    Subjective: Pt c/o intermittent knee pain    Discharge Exam: Vitals:   12/12/23 0723 12/12/23 1111  BP: 131/64 (!) 104/57  Pulse: 77 82  Resp: 16 16  Temp: 98.5 F (36.9 C) 98.5 F (36.9 C)  SpO2: 100% 100%   Vitals:   12/11/23 2245 12/12/23 0301 12/12/23 0723 12/12/23 1111  BP: (!) 98/56 95/63 131/64 (!) 104/57  Pulse: 72 79 77 82  Resp: 18 18 16 16   Temp: 98.3 F (36.8 C) 98.3 F (36.8 C) 98.5 F (36.9 C) 98.5 F (36.9 C)  TempSrc:      SpO2: 100% 99% 100% 100%  Weight:      Height:        General: Pt is alert, awake, not in  acute distress Cardiovascular:  S1/S2 +, no rubs, no gallops Respiratory: CTA bilaterally, no wheezing, no rhonchi Abdominal: Soft, NT,obese, bowel sounds + Extremities: no cyanosis    The results of significant diagnostics from this hospitalization (including imaging, microbiology, ancillary and laboratory) are listed below for reference.     Microbiology: No  results found for this or any previous visit (from the past 240 hours).   Labs: BNP (last 3 results) Recent Labs    07/14/23 1250  BNP 339.0*   Basic Metabolic Panel: Recent Labs  Lab 12/09/23 0533 12/09/23 2330 12/11/23 0342 12/12/23 0337  NA 136 137 137 136  K 3.7 4.0 4.0 3.9  CL 102 102 101 101  CO2 25 25 25 26   GLUCOSE 99 107* 101* 98  BUN 25* 21 16 26*  CREATININE 0.66 0.83 0.83 0.89  CALCIUM 8.5* 8.8* 8.4* 8.2*   Liver Function Tests: Recent Labs  Lab 12/09/23 2330  AST 73*  ALT 18  ALKPHOS 112  BILITOT 1.1  PROT 6.3*  ALBUMIN 3.6   Recent Labs  Lab 12/09/23 2320  LIPASE 17   No results for input(s): AMMONIA in the last 168 hours. CBC: Recent Labs  Lab 12/09/23 0533 12/09/23 2320 12/11/23 0342 12/12/23 0337  WBC 11.3* 13.7* 11.5* 8.3  NEUTROABS  --  11.4*  --   --   HGB 13.5 14.7 12.7* 11.7*  HCT 40.1 44.5 39.4 35.3*  MCV 89.7 91.6 91.6 91.5  PLT 205 183 182 189   Cardiac Enzymes: Recent Labs  Lab 12/10/23 0110  CKTOTAL 213   BNP: Invalid input(s): POCBNP CBG: No results for input(s): GLUCAP in the last 168 hours. D-Dimer No results for input(s): DDIMER in the last 72 hours. Hgb A1c No results for input(s): HGBA1C in the last 72 hours. Lipid Profile No results for input(s): CHOL, HDL, LDLCALC, TRIG, CHOLHDL, LDLDIRECT in the last 72 hours. Thyroid function studies No results for input(s): TSH, T4TOTAL, T3FREE, THYROIDAB in the last 72 hours.  Invalid input(s): FREET3 Anemia work up No results for input(s): VITAMINB12, FOLATE,  FERRITIN, TIBC, IRON, RETICCTPCT in the last 72 hours. Urinalysis    Component Value Date/Time   COLORURINE YELLOW (A) 11/27/2023 1009   APPEARANCEUR CLEAR (A) 11/27/2023 1009   LABSPEC 1.030 11/27/2023 1009   PHURINE 5.0 11/27/2023 1009   GLUCOSEU >=500 (A) 11/27/2023 1009   HGBUR NEGATIVE 11/27/2023 1009   BILIRUBINUR NEGATIVE 11/27/2023 1009   KETONESUR NEGATIVE 11/27/2023 1009   PROTEINUR NEGATIVE 11/27/2023 1009   NITRITE NEGATIVE 11/27/2023 1009   LEUKOCYTESUR NEGATIVE 11/27/2023 1009   Sepsis Labs Recent Labs  Lab 12/09/23 0533 12/09/23 2320 12/11/23 0342 12/12/23 0337  WBC 11.3* 13.7* 11.5* 8.3   Microbiology No results found for this or any previous visit (from the past 240 hours).   Time coordinating discharge: 35 minutes  SIGNED:   Anthony CHRISTELLA Pouch, MD  [facility] 12/12/2023, 1:11 PM Pager   If 7PM-7AM, please contact night-coverage [email or web address]
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
